# Patient Record
Sex: Female | Born: 1955
Health system: Southern US, Community
[De-identification: ages and names within clinical notes are randomized; demographics above are authoritative.]

## PROBLEM LIST (undated history)

## (undated) DIAGNOSIS — N189 Chronic kidney disease, unspecified: Secondary | ICD-10-CM

## (undated) DIAGNOSIS — M2550 Pain in unspecified joint: Secondary | ICD-10-CM

## (undated) DIAGNOSIS — F419 Anxiety disorder, unspecified: Secondary | ICD-10-CM

## (undated) DIAGNOSIS — Z85038 Personal history of other malignant neoplasm of large intestine: Principal | ICD-10-CM

## (undated) DIAGNOSIS — F32A Depression, unspecified: Secondary | ICD-10-CM

## (undated) DIAGNOSIS — F329 Major depressive disorder, single episode, unspecified: Secondary | ICD-10-CM

## (undated) DIAGNOSIS — Z9889 Other specified postprocedural states: Secondary | ICD-10-CM

## (undated) DIAGNOSIS — E785 Hyperlipidemia, unspecified: Secondary | ICD-10-CM

## (undated) DIAGNOSIS — C4491 Basal cell carcinoma of skin, unspecified: Secondary | ICD-10-CM

## (undated) DIAGNOSIS — M1990 Unspecified osteoarthritis, unspecified site: Secondary | ICD-10-CM

## (undated) DIAGNOSIS — R112 Nausea with vomiting, unspecified: Secondary | ICD-10-CM

## (undated) DIAGNOSIS — C801 Malignant (primary) neoplasm, unspecified: Secondary | ICD-10-CM

## (undated) DIAGNOSIS — G629 Polyneuropathy, unspecified: Secondary | ICD-10-CM

## (undated) HISTORY — DX: Unspecified osteoarthritis, unspecified site: M19.90

## (undated) HISTORY — DX: Chronic kidney disease, unspecified: N18.9

## (undated) HISTORY — DX: Polyneuropathy, unspecified: G62.9

## (undated) HISTORY — DX: Malignant (primary) neoplasm, unspecified: C80.1

## (undated) HISTORY — DX: Major depressive disorder, single episode, unspecified: F32.9

## (undated) HISTORY — DX: Hyperlipidemia, unspecified: E78.5

## (undated) HISTORY — DX: Basal cell carcinoma of skin, unspecified: C44.91

## (undated) HISTORY — DX: Anxiety disorder, unspecified: F41.9

## (undated) HISTORY — DX: Personal history of other malignant neoplasm of large intestine: Z85.038

---
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
## 1981-02-01 HISTORY — PX: NASAL SEPTUM SURGERY: SHX37

## 1988-02-02 HISTORY — PX: ECTOPIC PREGNANCY SURGERY: SHX613

## 1998-03-17 ENCOUNTER — Other Ambulatory Visit: Admission: RE | Admit: 1998-03-17 | Discharge: 1998-03-17 | Payer: Self-pay | Admitting: Obstetrics & Gynecology

## 1999-04-22 ENCOUNTER — Other Ambulatory Visit: Admission: RE | Admit: 1999-04-22 | Discharge: 1999-04-22 | Payer: Self-pay | Admitting: Obstetrics & Gynecology

## 2000-06-29 ENCOUNTER — Other Ambulatory Visit: Admission: RE | Admit: 2000-06-29 | Discharge: 2000-06-29 | Payer: Self-pay | Admitting: Obstetrics & Gynecology

## 2001-08-07 ENCOUNTER — Other Ambulatory Visit: Admission: RE | Admit: 2001-08-07 | Discharge: 2001-08-07 | Payer: Self-pay | Admitting: Obstetrics & Gynecology

## 2002-02-01 HISTORY — PX: ABDOMINAL HYSTERECTOMY: SHX81

## 2002-02-05 ENCOUNTER — Encounter (INDEPENDENT_AMBULATORY_CARE_PROVIDER_SITE_OTHER): Payer: Self-pay | Admitting: *Deleted

## 2002-02-06 ENCOUNTER — Inpatient Hospital Stay (HOSPITAL_COMMUNITY): Admission: RE | Admit: 2002-02-06 | Discharge: 2002-02-07 | Payer: Self-pay | Admitting: Obstetrics & Gynecology

## 2003-01-28 ENCOUNTER — Other Ambulatory Visit: Admission: RE | Admit: 2003-01-28 | Discharge: 2003-01-28 | Payer: Self-pay | Admitting: Obstetrics & Gynecology

## 2003-05-29 ENCOUNTER — Emergency Department (HOSPITAL_COMMUNITY): Admission: EM | Admit: 2003-05-29 | Discharge: 2003-05-29 | Payer: Self-pay | Admitting: Family Medicine

## 2003-08-28 ENCOUNTER — Emergency Department (HOSPITAL_COMMUNITY): Admission: EM | Admit: 2003-08-28 | Discharge: 2003-08-28 | Payer: Self-pay | Admitting: Emergency Medicine

## 2004-02-02 HISTORY — PX: HEMICOLECTOMY: SHX854

## 2004-02-02 HISTORY — PX: PORT-A-CATH REMOVAL: SHX5289

## 2004-04-08 ENCOUNTER — Encounter (INDEPENDENT_AMBULATORY_CARE_PROVIDER_SITE_OTHER): Payer: Self-pay | Admitting: *Deleted

## 2004-04-08 ENCOUNTER — Ambulatory Visit (HOSPITAL_COMMUNITY): Admission: RE | Admit: 2004-04-08 | Discharge: 2004-04-08 | Payer: Self-pay | Admitting: Gastroenterology

## 2004-04-16 ENCOUNTER — Encounter: Admission: RE | Admit: 2004-04-16 | Discharge: 2004-04-16 | Payer: Self-pay | Admitting: General Surgery

## 2004-04-20 ENCOUNTER — Encounter (INDEPENDENT_AMBULATORY_CARE_PROVIDER_SITE_OTHER): Payer: Self-pay | Admitting: Specialist

## 2004-04-20 ENCOUNTER — Inpatient Hospital Stay (HOSPITAL_COMMUNITY): Admission: RE | Admit: 2004-04-20 | Discharge: 2004-04-24 | Payer: Self-pay | Admitting: General Surgery

## 2004-04-22 ENCOUNTER — Ambulatory Visit: Payer: Self-pay | Admitting: Oncology

## 2004-04-28 ENCOUNTER — Ambulatory Visit: Payer: Self-pay | Admitting: Oncology

## 2004-06-04 ENCOUNTER — Ambulatory Visit (HOSPITAL_BASED_OUTPATIENT_CLINIC_OR_DEPARTMENT_OTHER): Admission: RE | Admit: 2004-06-04 | Discharge: 2004-06-04 | Payer: Self-pay | Admitting: General Surgery

## 2004-06-04 ENCOUNTER — Ambulatory Visit (HOSPITAL_COMMUNITY): Admission: RE | Admit: 2004-06-04 | Discharge: 2004-06-04 | Payer: Self-pay | Admitting: General Surgery

## 2004-06-15 ENCOUNTER — Ambulatory Visit: Payer: Self-pay | Admitting: Oncology

## 2004-08-10 ENCOUNTER — Ambulatory Visit: Payer: Self-pay | Admitting: Oncology

## 2004-09-04 ENCOUNTER — Other Ambulatory Visit: Admission: RE | Admit: 2004-09-04 | Discharge: 2004-09-04 | Payer: Self-pay | Admitting: Obstetrics & Gynecology

## 2004-09-28 ENCOUNTER — Ambulatory Visit: Payer: Self-pay | Admitting: Oncology

## 2004-10-12 ENCOUNTER — Ambulatory Visit: Payer: Self-pay | Admitting: Oncology

## 2004-10-23 ENCOUNTER — Inpatient Hospital Stay (HOSPITAL_COMMUNITY): Admission: AD | Admit: 2004-10-23 | Discharge: 2004-10-27 | Payer: Self-pay | Admitting: Oncology

## 2004-11-27 ENCOUNTER — Ambulatory Visit: Payer: Self-pay | Admitting: Oncology

## 2005-01-18 ENCOUNTER — Ambulatory Visit: Payer: Self-pay | Admitting: Oncology

## 2005-01-27 ENCOUNTER — Encounter: Admission: RE | Admit: 2005-01-27 | Discharge: 2005-01-27 | Payer: Self-pay | Admitting: Oncology

## 2005-02-01 HISTORY — PX: HERNIA REPAIR: SHX51

## 2005-02-10 ENCOUNTER — Ambulatory Visit (HOSPITAL_COMMUNITY): Admission: RE | Admit: 2005-02-10 | Discharge: 2005-02-11 | Payer: Self-pay | Admitting: General Surgery

## 2005-05-11 ENCOUNTER — Ambulatory Visit: Payer: Self-pay | Admitting: Oncology

## 2005-05-11 LAB — CBC WITH DIFFERENTIAL/PLATELET
BASO%: 1.3 % (ref 0.0–2.0)
Basophils Absolute: 0.1 10*3/uL (ref 0.0–0.1)
EOS%: 6.6 % (ref 0.0–7.0)
Eosinophils Absolute: 0.3 10*3/uL (ref 0.0–0.5)
HCT: 40.4 % (ref 34.8–46.6)
HGB: 13.7 g/dL (ref 11.6–15.9)
LYMPH%: 38.7 % (ref 14.0–48.0)
MCH: 30.7 pg (ref 26.0–34.0)
MCHC: 33.9 g/dL (ref 32.0–36.0)
MCV: 90.6 fL (ref 81.0–101.0)
MONO#: 0.4 10*3/uL (ref 0.1–0.9)
MONO%: 8 % (ref 0.0–13.0)
NEUT#: 2.2 10*3/uL (ref 1.5–6.5)
NEUT%: 45.4 % (ref 39.6–76.8)
Platelets: 262 10*3/uL (ref 145–400)
RBC: 4.46 10*6/uL (ref 3.70–5.32)
RDW: 13.5 % (ref 11.3–14.5)
WBC: 4.8 10*3/uL (ref 3.9–10.0)
lymph#: 1.9 10*3/uL (ref 0.9–3.3)

## 2005-05-11 LAB — COMPREHENSIVE METABOLIC PANEL
ALT: 15 U/L (ref 0–40)
AST: 21 U/L (ref 0–37)
Albumin: 4.6 g/dL (ref 3.5–5.2)
Alkaline Phosphatase: 104 U/L (ref 39–117)
BUN: 14 mg/dL (ref 6–23)
CO2: 25 mEq/L (ref 19–32)
Calcium: 8.9 mg/dL (ref 8.4–10.5)
Chloride: 105 mEq/L (ref 96–112)
Creatinine, Ser: 1 mg/dL (ref 0.4–1.2)
Glucose, Bld: 92 mg/dL (ref 70–99)
Potassium: 4.7 mEq/L (ref 3.5–5.3)
Sodium: 138 mEq/L (ref 135–145)
Total Bilirubin: 0.3 mg/dL (ref 0.3–1.2)
Total Protein: 7.1 g/dL (ref 6.0–8.3)

## 2005-05-11 LAB — LACTATE DEHYDROGENASE: LDH: 168 U/L (ref 94–250)

## 2005-05-11 LAB — CEA: CEA: 1.5 ng/mL (ref 0.0–5.0)

## 2005-05-13 ENCOUNTER — Encounter: Admission: RE | Admit: 2005-05-13 | Discharge: 2005-05-13 | Payer: Self-pay | Admitting: Oncology

## 2005-09-03 ENCOUNTER — Ambulatory Visit: Payer: Self-pay | Admitting: Oncology

## 2005-09-07 LAB — CBC WITH DIFFERENTIAL/PLATELET
BASO%: 1 % (ref 0.0–2.0)
Basophils Absolute: 0 10*3/uL (ref 0.0–0.1)
EOS%: 4.9 % (ref 0.0–7.0)
Eosinophils Absolute: 0.2 10*3/uL (ref 0.0–0.5)
HCT: 40.4 % (ref 34.8–46.6)
HGB: 13.9 g/dL (ref 11.6–15.9)
LYMPH%: 36.6 % (ref 14.0–48.0)
MCH: 31.4 pg (ref 26.0–34.0)
MCHC: 34.4 g/dL (ref 32.0–36.0)
MCV: 91.3 fL (ref 81.0–101.0)
MONO#: 0.3 10*3/uL (ref 0.1–0.9)
MONO%: 8.7 % (ref 0.0–13.0)
NEUT#: 1.9 10*3/uL (ref 1.5–6.5)
NEUT%: 48.8 % (ref 39.6–76.8)
Platelets: 237 10*3/uL (ref 145–400)
RBC: 4.42 10*6/uL (ref 3.70–5.32)
RDW: 14.4 % (ref 11.3–14.5)
WBC: 3.9 10*3/uL (ref 3.9–10.0)
lymph#: 1.4 10*3/uL (ref 0.9–3.3)

## 2005-09-07 LAB — COMPREHENSIVE METABOLIC PANEL
ALT: 9 U/L (ref 0–40)
AST: 16 U/L (ref 0–37)
Albumin: 4.3 g/dL (ref 3.5–5.2)
Alkaline Phosphatase: 86 U/L (ref 39–117)
BUN: 14 mg/dL (ref 6–23)
CO2: 29 mEq/L (ref 19–32)
Calcium: 9.4 mg/dL (ref 8.4–10.5)
Chloride: 103 mEq/L (ref 96–112)
Creatinine, Ser: 0.95 mg/dL (ref 0.40–1.20)
Glucose, Bld: 86 mg/dL (ref 70–99)
Potassium: 4.6 mEq/L (ref 3.5–5.3)
Sodium: 141 mEq/L (ref 135–145)
Total Bilirubin: 0.4 mg/dL (ref 0.3–1.2)
Total Protein: 6.8 g/dL (ref 6.0–8.3)

## 2005-09-07 LAB — CEA: CEA: 1.6 ng/mL (ref 0.0–5.0)

## 2005-09-07 LAB — LACTATE DEHYDROGENASE: LDH: 153 U/L (ref 94–250)

## 2005-09-09 ENCOUNTER — Encounter: Admission: RE | Admit: 2005-09-09 | Discharge: 2005-09-09 | Payer: Self-pay | Admitting: Oncology

## 2005-09-29 ENCOUNTER — Encounter: Admission: RE | Admit: 2005-09-29 | Discharge: 2005-09-29 | Payer: Self-pay | Admitting: Obstetrics & Gynecology

## 2006-01-12 ENCOUNTER — Ambulatory Visit: Payer: Self-pay | Admitting: Oncology

## 2006-01-14 LAB — COMPREHENSIVE METABOLIC PANEL
ALT: 9 U/L (ref 0–35)
AST: 14 U/L (ref 0–37)
Albumin: 4.3 g/dL (ref 3.5–5.2)
Alkaline Phosphatase: 76 U/L (ref 39–117)
BUN: 14 mg/dL (ref 6–23)
CO2: 26 mEq/L (ref 19–32)
Calcium: 9.1 mg/dL (ref 8.4–10.5)
Chloride: 106 mEq/L (ref 96–112)
Creatinine, Ser: 0.96 mg/dL (ref 0.40–1.20)
Glucose, Bld: 82 mg/dL (ref 70–99)
Potassium: 4.4 mEq/L (ref 3.5–5.3)
Sodium: 143 mEq/L (ref 135–145)
Total Bilirubin: 0.4 mg/dL (ref 0.3–1.2)
Total Protein: 6.8 g/dL (ref 6.0–8.3)

## 2006-01-14 LAB — CBC WITH DIFFERENTIAL/PLATELET
BASO%: 1.9 % (ref 0.0–2.0)
Basophils Absolute: 0.1 10*3/uL (ref 0.0–0.1)
EOS%: 6.1 % (ref 0.0–7.0)
Eosinophils Absolute: 0.2 10*3/uL (ref 0.0–0.5)
HCT: 37.9 % (ref 34.8–46.6)
HGB: 13.1 g/dL (ref 11.6–15.9)
LYMPH%: 38.5 % (ref 14.0–48.0)
MCH: 31.2 pg (ref 26.0–34.0)
MCHC: 34.6 g/dL (ref 32.0–36.0)
MCV: 90.1 fL (ref 81.0–101.0)
MONO#: 0.2 10*3/uL (ref 0.1–0.9)
MONO%: 6.3 % (ref 0.0–13.0)
NEUT#: 1.6 10*3/uL (ref 1.5–6.5)
NEUT%: 47.2 % (ref 39.6–76.8)
Platelets: 237 10*3/uL (ref 145–400)
RBC: 4.2 10*6/uL (ref 3.70–5.32)
RDW: 11.4 % (ref 11.3–14.5)
WBC: 3.5 10*3/uL — ABNORMAL LOW (ref 3.9–10.0)
lymph#: 1.3 10*3/uL (ref 0.9–3.3)

## 2006-01-14 LAB — CEA: CEA: 1.4 ng/mL (ref 0.0–5.0)

## 2006-01-14 LAB — LACTATE DEHYDROGENASE: LDH: 152 U/L (ref 94–250)

## 2006-01-17 ENCOUNTER — Encounter: Admission: RE | Admit: 2006-01-17 | Discharge: 2006-01-17 | Payer: Self-pay | Admitting: Oncology

## 2006-01-27 ENCOUNTER — Ambulatory Visit (HOSPITAL_COMMUNITY): Admission: RE | Admit: 2006-01-27 | Discharge: 2006-01-27 | Payer: Self-pay | Admitting: Oncology

## 2006-04-01 ENCOUNTER — Ambulatory Visit: Payer: Self-pay | Admitting: Oncology

## 2006-05-24 ENCOUNTER — Ambulatory Visit: Payer: Self-pay | Admitting: Oncology

## 2006-05-27 LAB — CBC WITH DIFFERENTIAL/PLATELET
BASO%: 1 % (ref 0.0–2.0)
Basophils Absolute: 0.1 10*3/uL (ref 0.0–0.1)
EOS%: 4.1 % (ref 0.0–7.0)
Eosinophils Absolute: 0.2 10*3/uL (ref 0.0–0.5)
HCT: 38.3 % (ref 34.8–46.6)
HGB: 13.5 g/dL (ref 11.6–15.9)
LYMPH%: 32.1 % (ref 14.0–48.0)
MCH: 32.1 pg (ref 26.0–34.0)
MCHC: 35.3 g/dL (ref 32.0–36.0)
MCV: 90.9 fL (ref 81.0–101.0)
MONO#: 0.4 10*3/uL (ref 0.1–0.9)
MONO%: 6.9 % (ref 0.0–13.0)
NEUT#: 3.3 10*3/uL (ref 1.5–6.5)
NEUT%: 55.9 % (ref 39.6–76.8)
Platelets: 268 10*3/uL (ref 145–400)
RBC: 4.21 10*6/uL (ref 3.70–5.32)
RDW: 13.9 % (ref 11.3–14.5)
WBC: 5.9 10*3/uL (ref 3.9–10.0)
lymph#: 1.9 10*3/uL (ref 0.9–3.3)

## 2006-05-27 LAB — COMPREHENSIVE METABOLIC PANEL
ALT: 11 U/L (ref 0–35)
AST: 15 U/L (ref 0–37)
Albumin: 4.3 g/dL (ref 3.5–5.2)
Alkaline Phosphatase: 73 U/L (ref 39–117)
BUN: 20 mg/dL (ref 6–23)
CO2: 27 mEq/L (ref 19–32)
Calcium: 8.9 mg/dL (ref 8.4–10.5)
Chloride: 106 mEq/L (ref 96–112)
Creatinine, Ser: 1.08 mg/dL (ref 0.40–1.20)
Glucose, Bld: 89 mg/dL (ref 70–99)
Potassium: 4.2 mEq/L (ref 3.5–5.3)
Sodium: 141 mEq/L (ref 135–145)
Total Bilirubin: 0.3 mg/dL (ref 0.3–1.2)
Total Protein: 7.2 g/dL (ref 6.0–8.3)

## 2006-05-27 LAB — LACTATE DEHYDROGENASE: LDH: 160 U/L (ref 94–250)

## 2006-05-27 LAB — CEA: CEA: 1.1 ng/mL (ref 0.0–5.0)

## 2006-05-30 ENCOUNTER — Ambulatory Visit (HOSPITAL_COMMUNITY): Admission: RE | Admit: 2006-05-30 | Discharge: 2006-05-30 | Payer: Self-pay | Admitting: Oncology

## 2006-09-28 ENCOUNTER — Ambulatory Visit: Payer: Self-pay | Admitting: Oncology

## 2006-09-30 LAB — CBC WITH DIFFERENTIAL/PLATELET
BASO%: 0.5 % (ref 0.0–2.0)
Basophils Absolute: 0 10*3/uL (ref 0.0–0.1)
EOS%: 3.9 % (ref 0.0–7.0)
Eosinophils Absolute: 0.2 10*3/uL (ref 0.0–0.5)
HCT: 38.2 % (ref 34.8–46.6)
HGB: 13.4 g/dL (ref 11.6–15.9)
LYMPH%: 35.7 % (ref 14.0–48.0)
MCH: 31.7 pg (ref 26.0–34.0)
MCHC: 35 g/dL (ref 32.0–36.0)
MCV: 90.4 fL (ref 81.0–101.0)
MONO#: 0.4 10*3/uL (ref 0.1–0.9)
MONO%: 8.8 % (ref 0.0–13.0)
NEUT#: 2.2 10*3/uL (ref 1.5–6.5)
NEUT%: 51.1 % (ref 39.6–76.8)
Platelets: 224 10*3/uL (ref 145–400)
RBC: 4.22 10*6/uL (ref 3.70–5.32)
RDW: 13.5 % (ref 11.3–14.5)
WBC: 4.3 10*3/uL (ref 3.9–10.0)
lymph#: 1.5 10*3/uL (ref 0.9–3.3)

## 2006-09-30 LAB — COMPREHENSIVE METABOLIC PANEL
ALT: 12 U/L (ref 0–35)
AST: 17 U/L (ref 0–37)
Albumin: 4.4 g/dL (ref 3.5–5.2)
Alkaline Phosphatase: 63 U/L (ref 39–117)
BUN: 17 mg/dL (ref 6–23)
CO2: 24 mEq/L (ref 19–32)
Calcium: 9.4 mg/dL (ref 8.4–10.5)
Chloride: 104 mEq/L (ref 96–112)
Creatinine, Ser: 1.07 mg/dL (ref 0.40–1.20)
Glucose, Bld: 88 mg/dL (ref 70–99)
Potassium: 4.1 mEq/L (ref 3.5–5.3)
Sodium: 139 mEq/L (ref 135–145)
Total Bilirubin: 0.4 mg/dL (ref 0.3–1.2)
Total Protein: 6.8 g/dL (ref 6.0–8.3)

## 2006-09-30 LAB — LACTATE DEHYDROGENASE: LDH: 149 U/L (ref 94–250)

## 2006-09-30 LAB — CEA: CEA: 1.4 ng/mL (ref 0.0–5.0)

## 2006-10-04 ENCOUNTER — Encounter: Admission: RE | Admit: 2006-10-04 | Discharge: 2006-10-04 | Payer: Self-pay | Admitting: Oncology

## 2006-10-25 ENCOUNTER — Encounter: Admission: RE | Admit: 2006-10-25 | Discharge: 2006-10-25 | Payer: Self-pay | Admitting: Obstetrics & Gynecology

## 2007-03-30 ENCOUNTER — Ambulatory Visit: Payer: Self-pay | Admitting: Oncology

## 2007-04-03 LAB — CBC WITH DIFFERENTIAL/PLATELET
BASO%: 0.3 % (ref 0.0–2.0)
Basophils Absolute: 0 10*3/uL (ref 0.0–0.1)
EOS%: 3.5 % (ref 0.0–7.0)
Eosinophils Absolute: 0.2 10*3/uL (ref 0.0–0.5)
HCT: 33.9 % — ABNORMAL LOW (ref 34.8–46.6)
HGB: 11.9 g/dL (ref 11.6–15.9)
LYMPH%: 35.8 % (ref 14.0–48.0)
MCH: 31.9 pg (ref 26.0–34.0)
MCHC: 35 g/dL (ref 32.0–36.0)
MCV: 91.4 fL (ref 81.0–101.0)
MONO#: 0.3 10*3/uL (ref 0.1–0.9)
MONO%: 6.4 % (ref 0.0–13.0)
NEUT#: 2.8 10*3/uL (ref 1.5–6.5)
NEUT%: 54 % (ref 39.6–76.8)
Platelets: 220 10*3/uL (ref 145–400)
RBC: 3.71 10*6/uL (ref 3.70–5.32)
RDW: 13.8 % (ref 11.3–14.5)
WBC: 5.2 10*3/uL (ref 3.9–10.0)
lymph#: 1.9 10*3/uL (ref 0.9–3.3)

## 2007-04-03 LAB — COMPREHENSIVE METABOLIC PANEL
ALT: 17 U/L (ref 0–35)
AST: 24 U/L (ref 0–37)
Albumin: 3.9 g/dL (ref 3.5–5.2)
Alkaline Phosphatase: 51 U/L (ref 39–117)
BUN: 13 mg/dL (ref 6–23)
CO2: 27 mEq/L (ref 19–32)
Calcium: 8.8 mg/dL (ref 8.4–10.5)
Chloride: 104 mEq/L (ref 96–112)
Creatinine, Ser: 0.96 mg/dL (ref 0.40–1.20)
Glucose, Bld: 95 mg/dL (ref 70–99)
Potassium: 3.8 mEq/L (ref 3.5–5.3)
Sodium: 138 mEq/L (ref 135–145)
Total Bilirubin: 0.6 mg/dL (ref 0.3–1.2)
Total Protein: 6.4 g/dL (ref 6.0–8.3)

## 2007-04-03 LAB — CEA: CEA: 1.6 ng/mL (ref 0.0–5.0)

## 2007-04-03 LAB — LACTATE DEHYDROGENASE: LDH: 153 U/L (ref 94–250)

## 2007-04-04 ENCOUNTER — Encounter: Admission: RE | Admit: 2007-04-04 | Discharge: 2007-04-04 | Payer: Self-pay | Admitting: Oncology

## 2007-04-13 ENCOUNTER — Ambulatory Visit (HOSPITAL_COMMUNITY): Admission: RE | Admit: 2007-04-13 | Discharge: 2007-04-13 | Payer: Self-pay | Admitting: Oncology

## 2007-08-23 ENCOUNTER — Ambulatory Visit: Payer: Self-pay | Admitting: Oncology

## 2007-08-25 LAB — CBC WITH DIFFERENTIAL/PLATELET
BASO%: 0.8 % (ref 0.0–2.0)
Basophils Absolute: 0 10*3/uL (ref 0.0–0.1)
EOS%: 4.3 % (ref 0.0–7.0)
Eosinophils Absolute: 0.2 10*3/uL (ref 0.0–0.5)
HCT: 38.8 % (ref 34.8–46.6)
HGB: 13.4 g/dL (ref 11.6–15.9)
LYMPH%: 48.9 % — ABNORMAL HIGH (ref 14.0–48.0)
MCH: 31.6 pg (ref 26.0–34.0)
MCHC: 34.7 g/dL (ref 32.0–36.0)
MCV: 91.3 fL (ref 81.0–101.0)
MONO#: 0.3 10*3/uL (ref 0.1–0.9)
MONO%: 8.5 % (ref 0.0–13.0)
NEUT#: 1.4 10*3/uL — ABNORMAL LOW (ref 1.5–6.5)
NEUT%: 37.5 % — ABNORMAL LOW (ref 39.6–76.8)
Platelets: 226 10*3/uL (ref 145–400)
RBC: 4.25 10*6/uL (ref 3.70–5.32)
RDW: 13.6 % (ref 11.3–14.5)
WBC: 3.8 10*3/uL — ABNORMAL LOW (ref 3.9–10.0)
lymph#: 1.9 10*3/uL (ref 0.9–3.3)

## 2007-08-25 LAB — COMPREHENSIVE METABOLIC PANEL
ALT: 19 U/L (ref 0–35)
AST: 21 U/L (ref 0–37)
Albumin: 4.4 g/dL (ref 3.5–5.2)
Alkaline Phosphatase: 58 U/L (ref 39–117)
BUN: 22 mg/dL (ref 6–23)
CO2: 28 mEq/L (ref 19–32)
Calcium: 9.4 mg/dL (ref 8.4–10.5)
Chloride: 104 mEq/L (ref 96–112)
Creatinine, Ser: 1.12 mg/dL (ref 0.40–1.20)
Glucose, Bld: 93 mg/dL (ref 70–99)
Potassium: 4 mEq/L (ref 3.5–5.3)
Sodium: 142 mEq/L (ref 135–145)
Total Bilirubin: 0.5 mg/dL (ref 0.3–1.2)
Total Protein: 7.1 g/dL (ref 6.0–8.3)

## 2007-08-25 LAB — LACTATE DEHYDROGENASE: LDH: 154 U/L (ref 94–250)

## 2007-08-25 LAB — CEA: CEA: 1.5 ng/mL (ref 0.0–5.0)

## 2007-08-28 ENCOUNTER — Encounter: Admission: RE | Admit: 2007-08-28 | Discharge: 2007-08-28 | Payer: Self-pay | Admitting: Oncology

## 2007-11-22 ENCOUNTER — Encounter: Admission: RE | Admit: 2007-11-22 | Discharge: 2007-11-22 | Payer: Self-pay | Admitting: Obstetrics & Gynecology

## 2008-02-02 HISTORY — PX: REDUCTION MAMMAPLASTY: SUR839

## 2008-02-02 HISTORY — PX: KNEE ARTHROSCOPY: SUR90

## 2008-02-15 ENCOUNTER — Ambulatory Visit: Payer: Self-pay | Admitting: Oncology

## 2008-02-20 ENCOUNTER — Ambulatory Visit (HOSPITAL_COMMUNITY): Admission: RE | Admit: 2008-02-20 | Discharge: 2008-02-20 | Payer: Self-pay | Admitting: Oncology

## 2008-03-07 ENCOUNTER — Ambulatory Visit (HOSPITAL_COMMUNITY): Admission: RE | Admit: 2008-03-07 | Discharge: 2008-03-07 | Payer: Self-pay | Admitting: Oncology

## 2008-07-18 ENCOUNTER — Ambulatory Visit: Payer: Self-pay | Admitting: Oncology

## 2008-07-22 LAB — CBC WITH DIFFERENTIAL/PLATELET
BASO%: 0.4 % (ref 0.0–2.0)
Basophils Absolute: 0 10*3/uL (ref 0.0–0.1)
EOS%: 2.8 % (ref 0.0–7.0)
Eosinophils Absolute: 0.1 10*3/uL (ref 0.0–0.5)
HCT: 36.4 % (ref 34.8–46.6)
HGB: 12.9 g/dL (ref 11.6–15.9)
LYMPH%: 36.8 % (ref 14.0–49.7)
MCH: 32.5 pg (ref 25.1–34.0)
MCHC: 35.3 g/dL (ref 31.5–36.0)
MCV: 92 fL (ref 79.5–101.0)
MONO#: 0.4 10*3/uL (ref 0.1–0.9)
MONO%: 7.4 % (ref 0.0–14.0)
NEUT#: 2.6 10*3/uL (ref 1.5–6.5)
NEUT%: 52.6 % (ref 38.4–76.8)
Platelets: 257 10*3/uL (ref 145–400)
RBC: 3.96 10*6/uL (ref 3.70–5.45)
RDW: 14.4 % (ref 11.2–14.5)
WBC: 5 10*3/uL (ref 3.9–10.3)
lymph#: 1.8 10*3/uL (ref 0.9–3.3)

## 2008-07-22 LAB — COMPREHENSIVE METABOLIC PANEL
ALT: 12 U/L (ref 0–35)
AST: 14 U/L (ref 0–37)
Albumin: 3.9 g/dL (ref 3.5–5.2)
Alkaline Phosphatase: 63 U/L (ref 39–117)
BUN: 18 mg/dL (ref 6–23)
CO2: 28 mEq/L (ref 19–32)
Calcium: 9 mg/dL (ref 8.4–10.5)
Chloride: 104 mEq/L (ref 96–112)
Creatinine, Ser: 1.03 mg/dL (ref 0.40–1.20)
Glucose, Bld: 118 mg/dL — ABNORMAL HIGH (ref 70–99)
Potassium: 4.2 mEq/L (ref 3.5–5.3)
Sodium: 140 mEq/L (ref 135–145)
Total Bilirubin: 0.4 mg/dL (ref 0.3–1.2)
Total Protein: 6.6 g/dL (ref 6.0–8.3)

## 2008-07-22 LAB — LACTATE DEHYDROGENASE: LDH: 170 U/L (ref 94–250)

## 2008-07-22 LAB — CEA: CEA: 1.5 ng/mL (ref 0.0–5.0)

## 2008-07-24 ENCOUNTER — Encounter: Admission: RE | Admit: 2008-07-24 | Discharge: 2008-07-24 | Payer: Self-pay | Admitting: Oncology

## 2009-01-06 IMAGING — PT NM PET TUM IMG SKULL BASE T - THIGH
1 of 6 series · 1 of 25 positions shown · non-contrast
Comparison: 01/27/06.

CLINICAL DATA: Follow-up metastatic colon carcinoma.  Status post chemotherapy.
 FDG PET-CT TUMOR IMAGING (SKULL BASE TO THIGHS):

 Fasting Blood Glucose:  109.
TECHNIQUE: 18.3 mCi F-18 FDG were administered via the right arm.  Full ring PET imaging was performed from the skull base through the mid-thighs 56 minutes after injection.  CT data was obtained and used for attenuation correction and anatomic localization only.  (This was not acquired as a diagnostic CT examination.)

[Series 1: pet ac · axial · 3.3mm · 4.69mm/px · 1 of 267 slices shown]
[im 134/267]
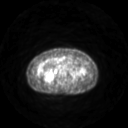

[1 of 25 positions shown; findings below may reference images not displayed]

FINDINGS: No abnormal sites of hypermetabolic activity are identified within the neck, chest, abdomen, or pelvis which are suspicious for malignancy.  No significant change is seen compared with prior study.
IMPRESSION: Stable PET CT scan.  No evidence of hypermetabolic tumor.

## 2009-01-23 ENCOUNTER — Ambulatory Visit: Payer: Self-pay | Admitting: Oncology

## 2009-01-28 LAB — CBC WITH DIFFERENTIAL/PLATELET
BASO%: 0.4 % (ref 0.0–2.0)
Basophils Absolute: 0 10*3/uL (ref 0.0–0.1)
EOS%: 2.5 % (ref 0.0–7.0)
Eosinophils Absolute: 0.2 10*3/uL (ref 0.0–0.5)
HCT: 38.1 % (ref 34.8–46.6)
HGB: 12.9 g/dL (ref 11.6–15.9)
LYMPH%: 29.5 % (ref 14.0–49.7)
MCH: 31.9 pg (ref 25.1–34.0)
MCHC: 33.9 g/dL (ref 31.5–36.0)
MCV: 93.9 fL (ref 79.5–101.0)
MONO#: 0.5 10*3/uL (ref 0.1–0.9)
MONO%: 8 % (ref 0.0–14.0)
NEUT#: 4 10*3/uL (ref 1.5–6.5)
NEUT%: 59.6 % (ref 38.4–76.8)
Platelets: 251 10*3/uL (ref 145–400)
RBC: 4.06 10*6/uL (ref 3.70–5.45)
RDW: 13.9 % (ref 11.2–14.5)
WBC: 6.7 10*3/uL (ref 3.9–10.3)
lymph#: 2 10*3/uL (ref 0.9–3.3)

## 2009-01-28 LAB — CEA: CEA: 1.4 ng/mL (ref 0.0–5.0)

## 2009-01-28 LAB — COMPREHENSIVE METABOLIC PANEL
ALT: 11 U/L (ref 0–35)
AST: 17 U/L (ref 0–37)
Albumin: 4.1 g/dL (ref 3.5–5.2)
Alkaline Phosphatase: 63 U/L (ref 39–117)
BUN: 17 mg/dL (ref 6–23)
CO2: 24 mEq/L (ref 19–32)
Calcium: 8.5 mg/dL (ref 8.4–10.5)
Chloride: 105 mEq/L (ref 96–112)
Creatinine, Ser: 1.15 mg/dL (ref 0.40–1.20)
Glucose, Bld: 98 mg/dL (ref 70–99)
Potassium: 4.1 mEq/L (ref 3.5–5.3)
Sodium: 141 mEq/L (ref 135–145)
Total Bilirubin: 0.2 mg/dL — ABNORMAL LOW (ref 0.3–1.2)
Total Protein: 6.5 g/dL (ref 6.0–8.3)

## 2009-01-28 LAB — LACTATE DEHYDROGENASE: LDH: 178 U/L (ref 94–250)

## 2009-02-01 HISTORY — PX: BREAST SURGERY: SHX581

## 2009-02-04 ENCOUNTER — Ambulatory Visit: Payer: Self-pay | Admitting: Oncology

## 2009-04-23 ENCOUNTER — Encounter: Admission: RE | Admit: 2009-04-23 | Discharge: 2009-04-23 | Payer: Self-pay | Admitting: Obstetrics & Gynecology

## 2009-08-15 ENCOUNTER — Ambulatory Visit: Payer: Self-pay | Admitting: Oncology

## 2009-08-19 LAB — COMPREHENSIVE METABOLIC PANEL
ALT: 9 U/L (ref 0–35)
AST: 15 U/L (ref 0–37)
Albumin: 4.3 g/dL (ref 3.5–5.2)
Alkaline Phosphatase: 66 U/L (ref 39–117)
BUN: 16 mg/dL (ref 6–23)
CO2: 24 mEq/L (ref 19–32)
Calcium: 9 mg/dL (ref 8.4–10.5)
Chloride: 105 mEq/L (ref 96–112)
Creatinine, Ser: 1.12 mg/dL (ref 0.40–1.20)
Glucose, Bld: 93 mg/dL (ref 70–99)
Potassium: 4.6 mEq/L (ref 3.5–5.3)
Sodium: 141 mEq/L (ref 135–145)
Total Bilirubin: 0.4 mg/dL (ref 0.3–1.2)
Total Protein: 6.9 g/dL (ref 6.0–8.3)

## 2009-08-19 LAB — CBC WITH DIFFERENTIAL/PLATELET
BASO%: 1.1 % (ref 0.0–2.0)
Basophils Absolute: 0 10*3/uL (ref 0.0–0.1)
EOS%: 4.9 % (ref 0.0–7.0)
Eosinophils Absolute: 0.2 10*3/uL (ref 0.0–0.5)
HCT: 36 % (ref 34.8–46.6)
HGB: 12.3 g/dL (ref 11.6–15.9)
LYMPH%: 44.8 % (ref 14.0–49.7)
MCH: 31.9 pg (ref 25.1–34.0)
MCHC: 34.2 g/dL (ref 31.5–36.0)
MCV: 93.2 fL (ref 79.5–101.0)
MONO#: 0.4 10*3/uL (ref 0.1–0.9)
MONO%: 9.2 % (ref 0.0–14.0)
NEUT#: 1.6 10*3/uL (ref 1.5–6.5)
NEUT%: 40 % (ref 38.4–76.8)
Platelets: 223 10*3/uL (ref 145–400)
RBC: 3.86 10*6/uL (ref 3.70–5.45)
RDW: 14.3 % (ref 11.2–14.5)
WBC: 4.1 10*3/uL (ref 3.9–10.3)
lymph#: 1.8 10*3/uL (ref 0.9–3.3)

## 2009-08-19 LAB — LACTATE DEHYDROGENASE: LDH: 154 U/L (ref 94–250)

## 2009-08-19 LAB — CEA: CEA: 1.7 ng/mL (ref 0.0–5.0)

## 2009-08-21 ENCOUNTER — Encounter: Admission: RE | Admit: 2009-08-21 | Discharge: 2009-08-21 | Payer: Self-pay | Admitting: Oncology

## 2010-02-22 ENCOUNTER — Encounter: Payer: Self-pay | Admitting: Oncology

## 2010-04-20 ENCOUNTER — Other Ambulatory Visit: Payer: Self-pay | Admitting: Obstetrics & Gynecology

## 2010-04-20 DIAGNOSIS — Z1231 Encounter for screening mammogram for malignant neoplasm of breast: Secondary | ICD-10-CM

## 2010-04-20 DIAGNOSIS — Z9889 Other specified postprocedural states: Secondary | ICD-10-CM

## 2010-04-29 ENCOUNTER — Ambulatory Visit
Admission: RE | Admit: 2010-04-29 | Discharge: 2010-04-29 | Disposition: A | Discharge status: Home / Self Care | Payer: BC Managed Care – PPO | Source: Ambulatory Visit | Attending: Obstetrics & Gynecology | Admitting: Obstetrics & Gynecology

## 2010-04-29 DIAGNOSIS — Z9889 Other specified postprocedural states: Secondary | ICD-10-CM

## 2010-04-29 DIAGNOSIS — Z1231 Encounter for screening mammogram for malignant neoplasm of breast: Secondary | ICD-10-CM

## 2010-05-19 LAB — BODY FLUID CELL COUNT WITH DIFFERENTIAL: Total Nucleated Cell Count, Fluid: 7 cu mm (ref 0–1000)

## 2010-05-19 LAB — CBC
HCT: 36.8 % (ref 36.0–46.0)
Hemoglobin: 12.7 g/dL (ref 12.0–15.0)
MCHC: 34.4 g/dL (ref 30.0–36.0)
MCV: 92 fL (ref 78.0–100.0)
Platelets: 218 10*3/uL (ref 150–400)
RBC: 4 MIL/uL (ref 3.87–5.11)
RDW: 13.9 % (ref 11.5–15.5)
WBC: 5.9 10*3/uL (ref 4.0–10.5)

## 2010-05-19 LAB — PROTIME-INR
INR: 1 (ref 0.00–1.49)
Prothrombin Time: 13.3 seconds (ref 11.6–15.2)

## 2010-05-19 LAB — PATHOLOGIST SMEAR REVIEW

## 2010-05-19 LAB — BODY FLUID CULTURE
Culture: NO GROWTH
Gram Stain: NONE SEEN

## 2010-06-19 NOTE — Discharge Summary (Signed)
NAMECAMIYA, Lorraine May                 ACCOUNT NO.:  [id number]   MEDICAL RECORD NO.:  [id number]          PATIENT TYPE:  OIB   LOCATION:  1012                         FACILITY:  [facility]   PHYSICIAN:  Gita Kudo, M.D. DATE OF BIRTH:  1955-07-10   DATE OF ADMISSION:  02/10/2005  DATE OF DISCHARGE:  02/11/2005                                 DISCHARGE SUMMARY   OPERATIVE PROCEDURE:  Repair abdominal incisional hernia, removal of  indwelling venous access device (X-Port).   SURGEON:  Dr. Maryagnes Amos   ASSISTANT:  Dr. Corliss Skains   ANESTHESIA:  General.   PREOPERATIVE DIAGNOSIS:  Incisional hernia and indwelling Port-A-Cath.   POSTOPERATIVE DIAGNOSIS:  Incisional hernia and indwelling Port-A-Cath.   CLINICAL SUMMARY:  A 55 year-old woman with abdominal incisional hernia.  In  March 2006, I resected a right colon cancer.  Approximately 8 months later,  at an office visit, she had an incisional hernia.  She has had chemotherapy  through a port placed in May and is due to have that removed also.   OPERATIVE FINDINGS:  The patient had a large size incisional hernia with a  fascial defect that measured approximately 5.5 cm in diameter.  The catheter  and reservoir were removed intact without any problem.   OPERATIVE PROCEDURE:  Under satisfactory general endotracheal anesthesia,  having received IV antibiotic and subcu heparin preop, the patient is  positioned, prepped and draped in the standard fashion.  The previous  midline incision was reopened and carefully taken down to the subcu where  the hernia was identified.  Good exposure and retraction allowed me to  sharply dissect and also use cautery to delineate the sac circumferentially  down to the fascia.  The sac was then opened and excess trimmed down to the  fascial margin.  Then the hernia edges were elevated and with sharp and  blunt dissection, I took adhesions all the way around internally, and this  allowed for good placement of the  mesh that we used.  Hemostasis was good.  Then a piece of [employer] Com[employer]ix mesh was used.  It was 11 x 14 cm oval.  It  underlay the defect and extended all around in at least 3-4 cm.  Following  this, quadrant sutures of 0 Prolene were placed through the abdominal wall  fascia peritoneum through the upper layer of the mesh and then back up and  tied on the top of the fascia.  In so doing, the mesh was held in good  position.  Additional sutures placed in between, and a total of 8 sutures  held the mesh in excellent position against the abdominal wall.  Then excess  sac was trimmed away and a little relaxing incision made just outside the  hernia defect margin.  The defect was then closed in a transverse direction  with interrupted 0 Prolene figure-of-eight and running number #1 PDS suture,  taking intermittent bites of the mesh to again hold it in good position.  The subcu was lavaged with saline, infiltrated with 30 mL of 0.25% Marcaine  with epinephrine for postop  analgesia.  Closed with deep 2-0 Vicryl suture  after a #19 JP drain was placed.  Then the skin edges were stapled.  A  sterile absorbent dressing was applied.   The port was then removed.  The area was prepped and draped in a standard  fashion.  The previous incision reopened down to the reservoir, and it was  removed from its capsule.  Sutures holding it in place cut in the reservoir  with the attached catheter removed intact and without problem.  The patient  was placed in the head up position during this time.  This wound was then  closed with interrupted 3-0 Vicryl and Steri-Strips for skin.  Sterile  dressings were applied, and the patient went to the recovery room from the  operating room in good condition without complication.           ______________________________  Gita Kudo, M.D.     MRL/MEDQ  D:  02/10/2005  T:  02/11/2005  Job:  [id number]   cc:   Duncan Dull, M.D.  Fax: [phone]   Graylin Shiver,  M.D.  Fax: [phone]   Genene Churn. Cyndie Chime, M.D.  Fax: [phone]

## 2010-06-19 NOTE — H&P (Signed)
NAME:  Lorraine May, Lorraine May                           ACCOUNT NO.:  [id number]   MEDICAL RECORD NO.:  [id number]                   PATIENT TYPE:  AMB   LOCATION:  SDC                                  FACILITY:  [facility]   PHYSICIAN:  Freddy Finner, M.D.                DATE OF BIRTH:  07-21-1955   DATE OF ADMISSION:  02/06/2002  DATE OF DISCHARGE:                                HISTORY & PHYSICAL   ADMISSION DIAGNOSES:  Uterine leiomyomata with one pedunculated and one  intramural probably degenerating, possible torsion of pedunculated myoma.  Clinical symptoms of postcoital bleeding and dysfunctional uterine bleeding.   HISTORY OF PRESENT ILLNESS:  The patient is a 55 year old white married  female, gravida 2, para 1, who had a tubal pregnancy and had tubal  sterilization along with partial salpingectomy in 1990 for an ectopic  pregnancy and a request for surgical sterilization.  Since that time she has  done well.  She has had perimenopausal symptoms for approximately 2-1/2  years and has been successfully managed with Vivelle 0.1 mg transdermal  estrogen replacement.  This has produced fairly regular periods over the  last 2-1/2 years, but she has started having significant postcoital  bleeding.   A recent pelvic ultrasound in the office revealed fibroids noted above.  There were no adnexal masses.  The patient has a known allergy to  progesterone supplements, documented many years ago presumably first during  pregnancy.  This precludes the use of progesterone as part of her hormone  replacement therapy because of her bleeding or good response to hormone  replacement, she has requested definitive surgical intervention and is  admitted at this time for laparoscopically-assisted vaginal hysterectomy and  bilateral salpingo-oophorectomy.   REVIEW OF SYSTEMS:  Currently this is otherwise negative.  There are no  cardiac, pulmonary, GI or GU complaints.   PAST MEDICAL HISTORY:  The patient  has no known significant medical  illnesses.   ALLERGIES:  TETRACYCLINE, SULFA DRUGS.   CURRENT MEDICATIONS:  Vivelle 0.1 mg skin patch.  No other medications at  the present time.   OTHER HISTORY:  She has never had a blood transfusion.   SOCIAL HISTORY:  She does not use cigarettes.   PREVIOUS SURGICAL PROCEDURES:  D&C for a missed AB previously, and the tubal  surgery noted above.   PHYSICAL EXAMINATION:  HEENT:  Grossly within normal limits.  NECK:  Thyroid gland is not palpably enlarged.  VITAL SIGNS:  Blood pressure on the most recent check in the office was  98/70.  CHEST:  Clear to auscultation.  HEART:  Normal sinus rhythm without murmurs, rubs or gallops.  ABDOMEN:  Soft and nontender, without appreciable organomegaly or palpable  masses.  PELVIC:  External genitalia, vagina and cervix are normal.  Bimanual reveals  the uterus to be approximately eight weeks size.  Please note ultrasound  findings as noted  above.  There are no palpable adnexal masses.  The rectum  is palpably normal.  The rectovaginal exam confirms.  EXTREMITIES:  Without clubbing, cyanosis or edema.   ASSESSMENT:  Pedunculated uterine leiomyomata; intramural uterine  leiomyomata; probable degeneration of intramural myoma; dysfunctional and  postcoital bleeding.   PLAN:  Laparoscopically-assisted vaginal hysterectomy and bilateral salpingo-  oophorectomy.  The patient has reviewed the video in the office describing  the operative procedure, including the potential risks of the procedure.  She is prepared to proceed.                                               Freddy Finner, M.D.    WRN/MEDQ  D:  02/03/2002  T:  02/03/2002  Job:  [id number]

## 2010-06-19 NOTE — Op Note (Signed)
NAMEMARISE, Lorraine May                 ACCOUNT NO.:  [id number]   MEDICAL RECORD NO.:  [id number]          PATIENT TYPE:  INP   LOCATION:  2855                         FACILITY:  [facility]   PHYSICIAN:  Gita Kudo, M.D. DATE OF BIRTH:  [birth year]   DATE OF PROCEDURE:  DATE OF DISCHARGE:                                 OPERATIVE REPORT   OPERATIVE PROCEDURE:  Right hemicolectomy.   SURGEON:  Gita Kudo, M.D.   ASSISTANT:  Vikki Ports, M.D.   ANESTHESIA:  General endotracheal.   PREOPERATIVE DIAGNOSIS:  Carcinoma of the cecum.   POSTOPERATIVE DIAGNOSIS:  Carcinoma of the cecum, no metastases noted.   CLINICAL SUMMARY:  A 55 year old female preparing for the surgery had anemia  diagnosed and then underwent colonoscopy with a biopsy-proven cancer of the  cecum. Preoperative CEA was slightly high at 5.7. Pre CAT scan negative for  metastatic disease.   OPERATIVE FINDINGS:  The patient had a tumor of her cecum that was mobile.  It was not attached. There were no enlarged nodes in the mesentery. The  liver looked and felt normal. There was no fluid in the abdomen. The  remainder of the bowel felt and looked normal. The major vessels felt  normal. The right ureter was identified, not injured.   OPERATIVE PROCEDURE:  Under satisfactory general endotracheal anesthesia,  the patient was prepped, draped, and positioned in the standard fashion. PAS  hose, Foley catheters, and NG placed. She received preoperative heparin and  antibiotics. She had a good bowel prep.  A midline incision was made into  the peritoneum and hemostasis controlled by cautery. Self-retaining  retractors placed after the manual and visual laparotomy performed.  Then,  the lateral reflections of the right colon were taken down using cautery up  to the hepatic flexure which was divided between multiple clamps and ties of  2-0 silk. In this fashion, we freed the entire right colon over to the  proximal  transverse colon and distal ileum. Then, the transverse colon and  ileum were each transected with a GIA stapler. The mesentery then taken  between clamps and ties of 2-0 silk and the specimen removed.  A stab wound  made in each of the bowel segments and anastomosis performed with a GIA  stapler. The staple line was hemostatic and, then, the stab wound closed  with interrupted 3-0 silk. The mesentery was then closed with figure-of-  eight 2-0 an 3-0 silks. A running 2-0 silk was then placed over the stab  wound closure and, also, the stapled ends of the bowel.  Gloves were changed. The abdomen was lavaged with saline and sponge and  needle counts correct.  Abdomen was closed with running double-stranded #1  PDS followed by staples for skin. Sterile dressings were then applied and  the patient went to the recovery room in good condition.      MRL/MEDQ  D:  04/20/2004  T:  04/20/2004  Job:  [id number]   cc:   Duncan Dull, M.D.  [address]  [place]  [place] [address]  Fax:  [phone]   Graylin Shiver, M.D.  [address]  [address]  [place], [place] [address]  Fax: [phone]   Almedia Balls. Ranell Patrick, M.D.  [facility]  [address]  [address]  [place]  [place] [address]  Fax: [phone]

## 2010-06-19 NOTE — Op Note (Signed)
[name], [name]                 ACCOUNT NO.:  [id number]   MEDICAL RECORD NO.:  [id number]          PATIENT TYPE:  AMB   LOCATION:  ENDO                         [facility]ACILITY:  [facility]   PHYSICIAN:  Graylin Shiver, M.D.   DATE OF BIRTH:  [birth year]   DATE OF PROCEDURE:  04/08/2004  DATE OF DISCHARGE:                                 OPERATIVE REPORT   PROCEDURE:  Esophagogastroduodenoscopy with biopsy.   INDICATION:  Newly discovered iron-deficiency anemia.   Informed consent was obtained after explanation of the risks of bleeding,  infection and perforation.   PREMEDICATION:  Fentanyl 75 mcg IV, Versed 6 mg IV.   PROCEDURE:  With the patient in the in the left lateral decubitus position.  The Olympus gastroscope was inserted into the oropharynx and passed into the  esophagus. It was advanced down the esophagus and then into the stomach and  into the duodenum. The second portion and bulb of the duodenum looked  normal. The stomach showed some mild erythema in the antrum compatible with  gastritis. Biopsy was obtained. The body of the stomach looked normal.  In  the fundus of the stomach seen on retroflexion, there were a few tiny fleshy  colored polyps, representative biopsies were obtained. They appeared to be  benign. The esophagus looked normal in its entirety. She tolerated the  procedure well without complications.   IMPRESSION:  1.  Antral gastritis.  2.  Benign-appearing fundic polyps in the stomach.   PLAN:  The biopsies will be checked.      Lorraine May  D:  04/08/2004  T:  04/08/2004  Job:  [id number]   cc:   Lorraine May, M.D.  [address]  [place]  [place] [address]  Fax: [phone]

## 2010-06-19 NOTE — Discharge Summary (Signed)
[name], [name] NO.:  [id number]   MEDICAL RECORD NO.:  [id number]            PATIENT TYPE:   LOCATION:                                 FACILITY:   PHYSICIAN:  Gita Kudo, M.D.      DATE OF BIRTH:   DATE OF ADMISSION:  DATE OF DISCHARGE:                                 DISCHARGE SUMMARY   CHIEF COMPLAINT:  Cancer right colon.   HISTORY OF PRESENT ILLNESS:  Lorraine May is a 55 year old woman brought in for  elective right colectomy for routine studies prior to knee surgery.  Anemia  was diagnosed.  Work-up with colonoscopy by Dr. Evette Cristal showed a cancer and  she is brought in for elective right colon resection.   LABORATORY DATA:  Pathology report:  Carcinoma right colon.  Right colectomy  with a 3.7 cm tumor, 17 lymph nodes positive, margins free.  Initial  hemoglobin 11, hematocrit 33, white count 3800.  CMET was normal.  A CEA was  high at 5.7 (normal 0 to 5.0).  She was A positive and not transfused.   HOSPITAL COURSE:  On the morning of admission, the patient underwent a right  colectomy without complications.  Postoperatively she did very well. She was  ambulatory, comfortable.  She was tolerating a diet.  Her catheter, oxygen  and IV were removed on schedule.  On her second postoperative day, Dr. Riley Churches saw her in consultation.  She continued to improve and on her  fourth postoperative day, was discharged.  She was allowed home on a regular  diet, limited activity, analgesics and to continue her iron.  She will be  seen later by Dr. Ranell Patrick for her orthopedic problems, Dr. Evette Cristal in follow-up  with colonoscopy next year, Dr. Kevan Ny for her regular care.  Dr. Cyndie Chime  has set up an appointment for consultation.   DISCHARGE DIAGNOSIS:  Carcinoma right colon.   OPERATION:  Right colectomy.   CONSULTATION:  Genene Churn. Cyndie Chime, M.D.   COMPLICATIONS/INFECTIONS:  None.   CONDITION ON DISCHARGE:  Good.             ___________________________________________  Gita Kudo, M.D.    MRL/MEDQ  D:  06/19/2004  T:  06/19/2004  Job:  [id number]   cc:   Genene Churn. Cyndie Chime, M.D.  [address] [facility]  [place]  [place] [address]  Fax: [phone]   Graylin Shiver, M.D.  [address]  [address]  [place], [place] [address]  Fax: [phone]   Duncan Dull, M.D.  [address]  [place]  [place] [address]  Fax: [phone]   Almedia Balls. Ranell Patrick, M.D.  [facility]  [address]  [address]  [place]  [place] [address]  Fax: [phone]

## 2010-06-19 NOTE — Discharge Summary (Signed)
NAME:  Lorraine May, Lorraine May                           ACCOUNT NO.:  [id number]   MEDICAL RECORD NO.:  [id number]                   PATIENT TYPE:  INP   LOCATION:  9304                                 FACILITY:  [facility]   PHYSICIAN:  Freddy Finner, M.D.                DATE OF BIRTH:  Jun 29, 1955   DATE OF ADMISSION:  02/05/2002  DATE OF DISCHARGE:  02/07/2002                                 DISCHARGE SUMMARY   DISCHARGE DIAGNOSES:  1. Uterine leiomyomata.  2. Clinical symptoms of postcoital bleeding.  3. Dysfunctional uterine bleeding.   OPERATIVE PROCEDURE:  1. Laparoscopically assisted vaginal hysterectomy.  2. Bilateral salpingo-oophorectomy.   FINDINGS:  Please note that the uterus was 500 g on weighing in the  operating room.   POSTOPERATIVE COMPLICATIONS:  Fever suspected secondary to cuff cellulitis.   OTHER COMPLICATIONS:  None.   DISPOSITION:  The patient was in satisfactory improved condition at the time  of her discharge.  She is to have progressively increased physical activity,  but no vaginal entry, no heavy lifting.  She is to return to the office in  approximately 10 days for her first postoperative visit.  She is to take a  regular diet.  She is given Percocet to be taken as needed for postoperative  pain.  She is to use a Vivelle patch 0.1 mg twice weekly.  She is to  complete three additional days of antibiotic, specifically Augmentin 875 mg  b.i.d. with food.  She is to report recurrent fever, severe pain.   Details of the present illness, past history, review of systems, and  physical examination are recorded in the admission note.  The patient's  physical findings on admission were primarily remarkable for enlargement of  the uterus which was confirmed by ultrasound showing uterine leiomyomata.   LABORATORY DATA:  Normal hemoglobin of 12.8 on admission, white count of  5.5, normal platelet count.  Prothrombin time and PTT were normal on  admission as was  urinalysis.  Postoperatively hemoglobin was 10.5 on the  first postoperative day and 9.9 on the second postoperative day.  Her white  count was 9.5 on the first postoperative day and 6.1 on the second  postoperative day which was the day of discharge.   HOSPITAL COURSE:  The patient was admitted on morning of surgery and above  described operative procedure was accomplished.  Postoperatively she did  have temperature elevation of 101.5 on the afternoon of the first  postoperative day.  It was felt that her fever was most likely from cuff  cellulitis.  She was started on Unasyn IV and rapidly defervesced with fall  in the white count by the following morning and she remained afebrile  throughout the  remainder of her hospital stay.  There were no other complications and at  the time of her discharge she was completely afebrile, was ambulating  without  difficulty, having adequate bowel and bladder function.  She was  discharged home with disposition as noted above.                                               Freddy Finner, M.D.    WRN/MEDQ  D:  03/20/2002  T:  03/20/2002  Job:  [id number]

## 2010-06-19 NOTE — Op Note (Signed)
[name], Lorraine May                 ACCOUNT NO.:  [id number]   MEDICAL RECORD NO.:  [id number]          PATIENT TYPE:  AMB   LOCATION:  ENDO                         FACILITY:  [facility]   PHYSICIAN:  Graylin Shiver, M.D.   DATE OF BIRTH:  1955/03/22   DATE OF PROCEDURE:  04/08/2004  DATE OF DISCHARGE:                                 OPERATIVE REPORT   PROCEDURE:  Colonoscopy with biopsy.   ENDOSCOPIST:  Graylin Shiver, M.D.   INDICATIONS FOR PROCEDURE:  Newly-discovered iron-deficiency anemia.   INFORMED CONSENT:  An informed consent was obtained after explanation of the  risks of bleeding, infection and perforation.   PREMEDICATION:  The procedure was done immediately after an EGD, with an  additional 25 mcg of fentanyl and 4 milligrams of Versed given.   PROCEDURE:  With the patient in the left lateral decubitus position a rectal  exam was performed. No masses were felt. The Olympus colonoscope was  inserted into the rectum and advanced around the colon to the cecum.  Cecal  landmarks were identified.  There was a large mass-like tumor just distal to  the ileocecal valve at the junction of the ascending colon and cecum  compatible with cancer. Biopsies were obtained. The transverse colon looked  normal. The descending colon, sigmoid and rectum looked normal.   She tolerated the procedure well without complications.   IMPRESSION:  A mass-like lesion at the junction of the descending colon and  cecum, just distal to the ileocecal valve. This was biopsied. The appearance  is compatible with colon cancer.   PLAN:  The biopsies will be checked. A surgical referral will be made.      SFG/MEDQ  D:  04/08/2004  T:  04/08/2004  Job:  [id number]   cc:   Duncan Dull, M.D.  [address]  [place]  [place] [address]  Fax: [phone]

## 2010-06-19 NOTE — Op Note (Signed)
NAME:  Lorraine May, Lorraine May                           ACCOUNT NO.:  [id number]   MEDICAL RECORD NO.:  [id number]                   PATIENT TYPE:  OBV   LOCATION:  9399                                 FACILITY:  [facility]   PHYSICIAN:  Freddy Finner, M.D.                DATE OF BIRTH:  1955-10-30   DATE OF PROCEDURE:  02/05/2002  DATE OF DISCHARGE:                                 OPERATIVE REPORT   PREOPERATIVE DIAGNOSES:  1. Fibroids.  2. Dysfunctional bleeding.   POSTOPERATIVE DIAGNOSES:  1. Fibroids.  2. Dysfunctional bleeding.   OPERATIVE PROCEDURE:  1. Laparoscopically assisted vaginal hysterectomy.  2. Bilateral salpingo-oophorectomy.  Please note that the uterus weighed     greater than 500 g.   ANESTHESIA:  General endotracheal.   ESTIMATED INTRAOPERATIVE BLOOD LOSS:  150 cc.   PACKS, DRAINS, AND CATHETERS:  Foley.   INTRAOPERATIVE COMPLICATIONS:  None.   PROCEDURE:  Details of the present illness recorded in admission note.  The  patient is admitted on the morning of surgery, brought to the operating  room, placed under adequate general endotracheal anesthesia.  Placed in the  dorsal lithotomy position using Allen stirrups system.  Betadine prep was  carried out using __________ solution of abdomen, perineum, and vagina.  Bladder was evacuated with [facility] catheter.  Cervix was visualized with a  bivalve speculum.  Hulka tenaculum attached without difficulty.  Sterile  drapes were applied.  Two small incisions were made, one at the umbilicus  and one just above the symphysis.  __________  11-mm trocar was introduced  in the umbilical incision while elevating the anterior abdominal wall  manually.  Direct inspection revealed adequate placement with no evidence of  injury on entry.  Pneumoperitoneum was allowed to accumulate with carbon  dioxide gas.  A 5 mm trocar was placed through the lower incision under  direct visualization.  A blunt probe and later grasping forceps  were used  through the lower trocar as well as the Nezhat irrigation system used at the  conclusion of the procedure.  Careful systematic examination of abdominal  and pelvic contents was carried out.  Significant findings were recorded in  still photographs which are retained in the office record.  This included  marked enlargement of the uterus.  There were some filmy adhesions in the  right ovary to the right pelvic side wall which were cleared with blunt  probe.  Tubes and ovaries otherwise normal.  Other peritoneal surfaces were  normal.  Appendix was not visualized.  Upper abdomen appeared to be normal.  The liver and gallbladder were visualized and appeared to be normal.  Using  the gyrus tripolar device through the operating channel of the laparoscope  and grasping forceps to elevate the adnexae, the infundibulopelvic and upper  broad ligaments were progressively fulgurated and divided.  This was carried  down to a level just  above the vessels on each side.  Attention was then  turned vaginally.  Posterior weighted vaginal retractor was placed.  Deavers  were used anterior and laterally for retraction.  Cervix was grasped with a  Christella Hartigan' tenaculum.  Colpotomy incision was made while tenting the mucosa  posterior to the cervix with an Allis.  The cervix was circumscribed with a  scalpel to release the mucosa.  The bladder was advanced off the cervix.  The uterosacral pedicles were retracted with the gyrus, Heaney type clamped  and sealed and divided.  Bladder pillars were taken separately, sealed and  divided.  The cardinal ligament pedicles were taken separately, sealed and  divided.  Anterior peritoneum was entered.  Vessel pedicles were taken and  an additional pedicle on each side above the vessels was taken with the  gyrus system.  Coring was required to remove the uterus which weighed more  than 500 g.  Angles of the vagina were grasped with Allis.  Each was  anchored to the  uterosacral with a mattress suture of 0 Monocryl.  Figure-of-  eight was required on the right uterosacral and just above for hemostasis.  Uterosacrals were plicated.  Posterior peritoneum closed with an interrupted  figure-of-eight of 0 Monocryl.  Cuff was closed vertically with figure-of-  eight of 0 Monocryl.  Foley catheter was placed.  Reinspection abdominally  was carried out.  Irrigation was carried out using the Nezhat system.  Adequate complete hemostasis was documented.  Irrigating solution was  aspirated from the abdomen.  Gas was allowed to escape.  Incisions were then  closed with interrupted subcuticular sutures of 3-0 Dexon.  The 0.25%  Marcaine was injected through incision sites for postoperative analgesia.  The 1/4 inch Steri-Strips were applied to the lower incision.                                               Freddy Finner, M.D.    WRN/MEDQ  D:  02/05/2002  T:  02/05/2002  Job:  [id number]

## 2010-06-19 NOTE — Consult Note (Signed)
NAMEHUXLEY, May                 ACCOUNT NO.:  [id number]   MEDICAL RECORD NO.:  [id number]          PATIENT TYPE:  INP   LOCATION:  5710                         FACILITY:  [facility]   PHYSICIAN:  Genene Churn. Granfortuna, M.D.DATE OF BIRTH:  10/21/1955   DATE OF CONSULTATION:  04/22/2004  DATE OF DISCHARGE:                                   CONSULTATION   MEDICAL ONCOLOGY CONSULTATION:   HISTORY OF PRESENT ILLNESS:  This is a Medical Oncology Consultation of this  55 year old woman with newly diagnosed colon cancer.  [name] is a 29-  year-old Caucasian woman in overall excellent health.  She was going to have  elective knee surgery three weeks ago.  Preoperative laboratory showed a  hemoglobin of 7.5.  She was referred to Dr. Evette Cristal and underwent a  colonoscopy on April 08, 2004.  She was found to have a large mass just  distal to the ileocecal valve at the junction of the ascending colon and the  cecum.  Biopsies showed invasive adenocarcinoma.  Upper endoscopy was also  done at that time and showed reactive gastritis in the antrum, otherwise,  normal.  There was some benign gastric polyps.  She was negative for  helicobacter.   She was referred to Dr. Jerelene Redden.  She underwent a right hemicolectomy  April 20, 2004, at [facility].  Pathology shows a 3.7 cm,  moderately differentiated adenocarcinoma, with a full bowel wall penetration  and focal invasion into pericolonic adipose tissue.  Surgical margines are  all free of tumor.  There was no vascular or lymphatic invasion, and 17  lymph nodes are negative for tumor.  A preoperative chest x-ray and a CT  scan of the abdomen and pelvis are normal.  Preop CEA 5.7.   Remarkably, she denied any weakness, fatigue, hematochezia, melena,  abdominal pain or cramps or change in bowel habit.  She did notice a craving  for ice in the last few weeks.   PAST MEDICAL HISTORY:  1.  Strabismus surgery as a child.  2.  Surgery for  sinus problems.  3.  Hysterectomy two years ago, done for fibroids.  4.  Labs one year ago and not told any significant anemia.  5.  No history of any hypertension, MI, emphysema, asthma, ulcers,      hepatitis, yellow jaundice (although, she did receive gammaglobulin      injections for previous exposure).  No kidney stones, thyroid trouble,      diabetes.  No history of blood clots.  6.  Depression.   PREHOSPITAL MEDICATIONS:  1.  Wellbutrin XL 150 mg daily.  2.  Cymbalta 30 mg daily.  3.  Multivitamin and iron.  4.  Xanax 0.25 mg p.r.n.   FAMILY HISTORY:  A maternal grandmother had colon cancer in her mid 66's.  Three half uncles on her mother's side have various cancers, one of the  throat, one of the bladder, the other possibly gallbladder.  She has one  sister who is overall healthy and two brothers.   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  She is married.  No tobacco, occasional alcohol.  One son 34 years old,  Archivist at [employer].   PHYSICAL EXAMINATION:  Deferred at this time.   IMPRESSION:  A 55 year old woman presents with asymptomatic microcytic  anemia and is found to have a 4 cm, moderately differentiated adenocarcinoma  of the cecum.  She has a pathologic stage II T3N0M0 lesion.   Although we do not recommend chemotherapy for the routine stage II patient,  in view of her young age, I think it is appropriate to put this lady on a  chemotherapy adjuvant chemotherapy program.  I discussed the diagnosis,  prognosis and overview of treatment of colon cancer with the patient  together with her husband.  I will arrange outpatient follow up to discuss  and appropriate program tailored for her situation.  We do have an available  clinical  trial, and I will assess her eligibility.  We are looking at using the  biological agents such as Avastin and Erbitux in combination with  chemotherapy in the adjuvant setting at this  time.   Thank you for this consultation.      JMG/MEDQ  D:  04/22/2004  T:  04/23/2004  Job:  [id number]   cc:   Gita Kudo, M.D.  [address], [address]  [place]  [place] [address]   Duncan Dull, M.D.  [address]  [place]  [place] [address]  Fax: [phone]   Almedia Balls. Ranell Patrick, M.D.  [facility]  [address]  [address]  [place]  [place] [address]  Fax: [phone]   W. Varney Baas, M.D.  Fax: [phone]   Graylin Shiver, M.D.  [address]  [address]  [place], [place] [address]  Fax: [phone]

## 2010-06-19 NOTE — Discharge Summary (Signed)
NAMELEIDI, ASTLE                 ACCOUNT NO.:  [id number]   MEDICAL RECORD NO.:  [id number]          PATIENT TYPE:  INP   LOCATION:  1314                         [facility]ACILITY:  [facility]   PHYSICIAN:  Genene Churn. Granfortuna, M.D.DATE OF BIRTH:  14-Jun-1955   DATE OF ADMISSION:  10/23/2004  DATE OF DISCHARGE:  10/27/2004                                 DISCHARGE SUMMARY   Ms. Lorraine May  is a 55 year old woman diagnosed with stage II colon cancer in  March of this year. Please see history and physical for details. She  underwent a right hemicolectomy April 20, 2004. The tumor did invade through  the bowel wall but lymph glands were negative. She started chemotherapy with  the FOLFOX regimen but treatment had to be stopped after three cycles due to  poor tolerance. She began oral Xeloda in [id number]. She presented on the day  of admission with a 3-week history of diarrhea and abdominal pain. She had  called our office a few days earlier with these complaints. We advised her  to stop all the Xeloda, keep herself hydrated, and take Imodium. However,  she developed a low grade fever to 100.5 degrees and then abdominal pain and  cramps. Nausea but no vomiting; but unable to maintain good oral hydration  as an outpatient and she was admitted for further evaluation.   Initial examination by Dr. Dalene Carrow , she was afebrile. Pertinent findings  limited to the abdomen which was diffusely tender, more so in the right  upper quadrant and epigastrium.   LABORATORY DATA:  Lab database showed a white count of 8600, hemoglobin of  13.7, platelets 309,000, minor elevations of SGOT 45 and SGPT 48, sodium of  130, potassium 3.7, BUN 9, creatinine 1.   HOSPITAL COURSE:  She was started on parenteral hydration and antiemetics.  Stools were cultured and within 24 hours of admission were positive for C  difficile toxin. A course of Flagyl was initiated.   Abdominal symptoms resolved rapidly. She continued to have  frequent loose  bowel movements. Blood cultures and urine cultures remained sterile at 72  hours. No ova and parasites seen in the stool specimens. Regular x-rays of  the abdomen showed some right-sided abdominal and upper pelvic air fluid  levels without significant small bowel distension or gross obstruction.  Abdominal ultrasound was unremarkable.   Stool slowly began to form. Abdominal pain resolved rapidly. Laboratory  remained stable.   I felt she was stable at that point for discharge to continue oral Flagyl as  an outpatient.   CONSULTATIONS:  None.   PROCEDURES:  Abdominal ultrasound.   DIAGNOSES:  1.  Chemotherapy induced C difficile colitis.  2.  Stage II colon cancer.  3.  Depression on chronic treatment.   DISPOSITION:  Condition stable at time of discharge. Resume regular  activity, light low-fat diet.   MEDICATIONS:  1.  Flagyl 500 milligrams p.o. t.i.d. x 5days, b.i.d. x5 days, daily x5      days.  2.  Imodium or Pepto-Bismol p.r.n. Maximum eight Imodium tablets per 24  hours.  3.  Cymbalta 30 mg daily.  4.  Xanax 0.25 mg q.6 h p.r.n. anxiety.  5.  Compazine 10 mg q.6 h p.r.n. nausea or vomiting. ]  6.  She is instructed to hold Xeloda chemotherapy until further notice.   FOLLOW-UP:  In my office next week on November 04, 2004.      Genene Churn. Cyndie Chime, M.D.  Electronically Signed     JMG/MEDQ  D:  10/27/2004  T:  10/28/2004  Job:  [id number]   cc:   Duncan Dull, M.D.  Fax: [phone]   Gita Kudo, M.D.  [address]  [place]  [place] [address]

## 2010-06-19 NOTE — H&P (Signed)
Lorraine May, Lorraine May                 ACCOUNT NO.:  [id number]   MEDICAL RECORD NO.:  [id number]          PATIENT TYPE:  INP   LOCATION:  1326                         [facility]ACILITY:  [facility]   PHYSICIAN:  Lauretta I. Odogwu, M.D.DATE OF BIRTH:  1955-03-31   DATE OF ADMISSION:  10/23/2004  DATE OF DISCHARGE:                                HISTORY & PHYSICAL   CHIEF COMPLAINT:  Diarrhea and abdominal pain.   HISTORY OF PRESENT ILLNESS:  Lorraine May is a 55 year old woman who was  diagnosed with stage II colon cancer [id number]. She initially presented with  a hemoglobin of 7.5. She was referred to Dr. Evette Cristal and underwent a  colonoscopy on March8,2006. She was found to have a large mass just distal  to the ileocecal valve at the junction of the ascending colon and the cecum.  Biopsy showed invasive adenocarcinoma. She underwent a right hemicolectomy  procedure on March20,2006. Pathology showed a 3.7 cm moderately  differentiated adenocarcinoma with full bowel wall penetration and focal  invasion into pericolonic adipose tissue. Surgical margins were free of  tumor. There was no vascular or lymphatic invasion. Seventeen lymph nodes  were negative for cancer. She began adjuvant chemotherapy on the FOLFOX  regimen May17, 2006. She completed three cycles. The FOLFOX was discontinued  after the third cycle secondary to poor tolerance. She began Xeloda in early  July2006. To date, she has completed four cycles. The dose of the Xeloda was  escalated to 2000 mg q.a.m. and 1500 mg q.p.m. of beginning with cycle #4 on  October 05, 2004.   Lorraine May presents to the office today with a 3-week history of diarrhea  and abdominal pain. She is having five to six loose stools per day of  varying volumes. She has taken Imodium intermittently with some improvement.  She completed the most recent cycle of Xeloda on October 18, 2004. Oral  intake is poor. She has been nauseated for the past several days. No  emesis.  She had a fever of 100.2 degrees two nights ago and 100.5 degrees last  night. She had no associated shaking chills. She feels weak. She denies any  bloody or black bowel movements. No hematuria or dysuria. She is having no  shortness of breath or cough. She denies any problems related to the Port-A-  Cath.   PAST MEDICAL HISTORY:  1.  History of stage II colon cancer as outlined above.  2.  Depression.  3.  Strabismus surgery as a child.  4.  Hysterectomy 2 years ago secondary to fibroids.  5.  History of sinus surgery.   CURRENT MEDICATIONS:  1.  Wellbutrin XL 150 mg daily.  2.  Cymbalta 30 mg daily.  3.  Compazine 10 mg every 6 hours as needed.  4.  Vitamin B6 one tablet three times daily.  5.  Tylenol 325 mg as needed.  6.  Xanax 0.25 mg as needed.  7.  Lunesta q.h.s. p.r.n.  8.  Imodium p.r.n.   ALLERGIES:  1.  SULFA.  2.  TETRACYCLINE.  3.  DILAUDID.  4.  MORPHINE.  FAMILY HISTORY:  Maternal grandmother with colon cancer in her mid 53s.  Three half-uncles on her mother's side of the family have had various  cancers; one of the throat, one of the bladder, and the other possibly  gallbladder.   SOCIAL HISTORY:  Lorraine May lives in [place]. She is married. No tobacco  or EtOH use. She is a retired Airline pilot.   REVIEW OF SYSTEMS:  Lorraine May presents today with a 3-week history of  diarrhea and abdominal pain. She is having five to six loose stools per day.  She has been nauseated for the past several days. No vomiting. Fever to  101.5 last night; 100.2 degrees two nights ago. She feels weak. No hematuria  or dysuria. No shortness of breath or cough. No pain or redness at the [facility]-  A-Cath site.   PHYSICAL EXAMINATION:  VITAL SIGNS:  Temperature 98.4, heart rate 95,  respirations 18, blood pressure 121/76.  GENERAL:  Pleasant Caucasian female in no acute distress.  HEENT:  Normocephalic, atraumatic. Pupils equal, round and reactive to  light.  Extraocular movements are intact. Sclerae anicteric. Oropharynx is  without thrush or ulceration.  CHEST:  Lungs clear bilaterally; no wheezes or rales.  CARDIOVASCULAR:  Regular, tachycardiac.  ABDOMEN:  Soft. Diffusely tender, greatest at the right upper quadrant and  epigastrium.  EXTREMITIES:  No cyanosis, clubbing or edema. Hands and feet are dry with  some peeling.  SKIN:  Pale, warm.  NEUROLOGIC:  Alert and oriented. Motor strength 5/5. DTRs 2+, symmetric.   LABORATORY DATA:  Hemoglobin of 13.7; white count 8.6; platelet count  309,000. Sodium 130, potassium 3.7, chloride 94, CO2 26, glucose 112, BUN 9,  creatinine 1, total bilirubin 1, alkaline phosphatase 87, SGOT 45, SGPT 48,  total protein 6.7, albumin 3.5, calcium 8.5.   IMPRESSION:  1.  History of stage II colon cancer, currently being treated with adjuvant      Xeloda therapy.  2.  Diarrhea, grade 3, secondary to Xeloda, associated with dehydration.  3.  Abdominal pain.  4.  Fever.  5.  Depression.   PLAN:  1.  Admit to the oncology unit at [facility].  2.  Initiate intravenous hydration.  3.  Plain film of the abdomen.  4.  Abdominal ultrasound secondary to right upper quadrant tenderness.  5.  Check amylase and lipase.  6.  Stool for C difficile, routine culture, and O&P.  7.  Pan culture and begin broad-spectrum antibiotics.  8.  Begin Imodium after abdominal films.   Lorraine May is a Full Code.   The patient interviewed and examined by Dr. Dalene Carrow; plan reviewed.      Lorraine May, N.P.      Vicente Serene I. Odogwu, M.D.  Electronically Signed    LT/MEDQ  D:  10/23/2004  T:  10/23/2004  Job:  [id number]

## 2010-06-19 NOTE — Op Note (Signed)
[name], Lorraine May                 ACCOUNT NO.:  [id number]   MEDICAL RECORD NO.:  [id number]          PATIENT TYPE:  AMB   LOCATION:  DSC                          FACILITY:  [facility]   PHYSICIAN:  Gita Kudo, M.D. DATE OF BIRTH:  October 12, 1955   DATE OF PROCEDURE:  06/04/2004  DATE OF DISCHARGE:                                 OPERATIVE REPORT   OPERATIVE PROCEDURE:  Insertion indwelling venous access system - Export.   SURGEON:  Gita Kudo, M.D.   ANESTHESIA:  MAC - IV sedation plus 1% Xylocaine.   PREOPERATIVE DIAGNOSIS:  Carcinoma colon, post resection - 6 weeks.   OPERATIVE INDICATIONS:  This is a 55 year old lady who had a well  differentiated carcinoma that was about 4 cm in size.  Lymph nodes were  negative.  She will require some adjuvant therapy and placement of a port  for this.   OPERATIVE FINDINGS:  The left subclavian vein was punctured without incident  and the catheter appeared to be in good position by flushing and fluoroscopy  at the end of the procedure.   OPERATIVE PROCEDURE:  Having received 1.0 gram of Ancef, the patient was  given IV sedation and prepped, draped and positioned in the standard  fashion.  1% Xylocaine was used for local analgesia.  The left subclavian  puncture made and J-wire let down into the right atrium as confirmed by  fluoroscopy.  Then a tunnel was made to a pocket that was developed in the  deep subcu inferior and somewhat medial to the puncture site.  The [facility]  system was checked for size and fit in the cavity nicely.  Then the  introducer was placed from the dilated puncture site to the pocket and the  catheter brought through and cut to the appropriate length.  It was fastened  to the reservoir and secured with 2-0 Prolene sutures.  The system was  flushed with dilute heparin.  The dilator and introducer were placed over  the J-wire and inserted. Fluoroscopy showed them to be in good position.  The J-wire and dilator  were withdrawn, leaving the introducer in place.  Catheter placed through the introducer which was then peeled away, leaving  the catheter in good position as confirmed by fluoroscopy and blood return.  It looked as though it was at the superior vena cava.   Following this, the catheter was noted to be a little redundant and the  puncture site was extended and the catheter inserted further into the vein  and lay in good position.  Then the wounds were closed with 3-0 and 4-0  Vicryl, followed by Steri-Strips.  X-rays will be obtained and she will  receive her chemotherapy per Dr. Cyndie Chime.      MRL/MEDQ  D:  06/04/2004  T:  06/04/2004  Job:  [id number]   cc:   Genene Churn. Cyndie Chime, M.D.  [address] [facility]  [place]  [place] [address]  Fax: [phone]

## 2010-08-18 ENCOUNTER — Other Ambulatory Visit: Payer: Self-pay | Admitting: Oncology

## 2010-08-18 ENCOUNTER — Encounter (HOSPITAL_BASED_OUTPATIENT_CLINIC_OR_DEPARTMENT_OTHER): Payer: BC Managed Care – PPO | Admitting: Oncology

## 2010-08-18 DIAGNOSIS — C180 Malignant neoplasm of cecum: Secondary | ICD-10-CM

## 2010-08-18 LAB — CBC WITH DIFFERENTIAL/PLATELET
BASO%: 0.5 % (ref 0.0–2.0)
Basophils Absolute: 0 10*3/uL (ref 0.0–0.1)
EOS%: 3.4 % (ref 0.0–7.0)
Eosinophils Absolute: 0.1 10*3/uL (ref 0.0–0.5)
HCT: 37.2 % (ref 34.8–46.6)
HGB: 12.9 g/dL (ref 11.6–15.9)
LYMPH%: 37.1 % (ref 14.0–49.7)
MCH: 32.3 pg (ref 25.1–34.0)
MCHC: 34.6 g/dL (ref 31.5–36.0)
MCV: 93.4 fL (ref 79.5–101.0)
MONO#: 0.3 10*3/uL (ref 0.1–0.9)
MONO%: 8 % (ref 0.0–14.0)
NEUT#: 2.2 10*3/uL (ref 1.5–6.5)
NEUT%: 51 % (ref 38.4–76.8)
Platelets: 227 10*3/uL (ref 145–400)
RBC: 3.98 10*6/uL (ref 3.70–5.45)
RDW: 14.3 % (ref 11.2–14.5)
WBC: 4.2 10*3/uL (ref 3.9–10.3)
lymph#: 1.6 10*3/uL (ref 0.9–3.3)

## 2010-08-18 LAB — COMPREHENSIVE METABOLIC PANEL
ALT: 11 U/L (ref 0–35)
AST: 16 U/L (ref 0–37)
Albumin: 4 g/dL (ref 3.5–5.2)
Alkaline Phosphatase: 75 U/L (ref 39–117)
BUN: 19 mg/dL (ref 6–23)
CO2: 30 mEq/L (ref 19–32)
Calcium: 9.3 mg/dL (ref 8.4–10.5)
Chloride: 103 mEq/L (ref 96–112)
Creatinine, Ser: 1.06 mg/dL (ref 0.50–1.10)
Glucose, Bld: 90 mg/dL (ref 70–99)
Potassium: 4.1 mEq/L (ref 3.5–5.3)
Sodium: 138 mEq/L (ref 135–145)
Total Bilirubin: 0.3 mg/dL (ref 0.3–1.2)
Total Protein: 7.2 g/dL (ref 6.0–8.3)

## 2010-08-18 LAB — CEA: CEA: 1.6 ng/mL (ref 0.0–5.0)

## 2010-08-18 LAB — LACTATE DEHYDROGENASE: LDH: 168 U/L (ref 94–250)

## 2010-08-25 ENCOUNTER — Encounter (HOSPITAL_BASED_OUTPATIENT_CLINIC_OR_DEPARTMENT_OTHER): Payer: BC Managed Care – PPO | Admitting: Oncology

## 2010-08-25 DIAGNOSIS — C180 Malignant neoplasm of cecum: Secondary | ICD-10-CM

## 2011-01-09 ENCOUNTER — Telehealth: Payer: Self-pay | Admitting: Oncology

## 2011-01-09 NOTE — Telephone Encounter (Signed)
S/w the pt regarding the July 2013 appts

## 2011-08-17 ENCOUNTER — Other Ambulatory Visit (HOSPITAL_BASED_OUTPATIENT_CLINIC_OR_DEPARTMENT_OTHER): Payer: BC Managed Care – PPO | Admitting: Lab

## 2011-08-17 DIAGNOSIS — C180 Malignant neoplasm of cecum: Secondary | ICD-10-CM

## 2011-08-17 LAB — CBC WITH DIFFERENTIAL/PLATELET
BASO%: 0.8 % (ref 0.0–2.0)
Basophils Absolute: 0 10*3/uL (ref 0.0–0.1)
EOS%: 3 % (ref 0.0–7.0)
Eosinophils Absolute: 0.2 10*3/uL (ref 0.0–0.5)
HCT: 38.6 % (ref 34.8–46.6)
HGB: 13.1 g/dL (ref 11.6–15.9)
LYMPH%: 32.1 % (ref 14.0–49.7)
MCH: 31.8 pg (ref 25.1–34.0)
MCHC: 33.8 g/dL (ref 31.5–36.0)
MCV: 94.1 fL (ref 79.5–101.0)
MONO#: 0.4 10*3/uL (ref 0.1–0.9)
MONO%: 7 % (ref 0.0–14.0)
NEUT#: 2.9 10*3/uL (ref 1.5–6.5)
NEUT%: 57.1 % (ref 38.4–76.8)
Platelets: 228 10*3/uL (ref 145–400)
RBC: 4.1 10*6/uL (ref 3.70–5.45)
RDW: 13.6 % (ref 11.2–14.5)
WBC: 5.1 10*3/uL (ref 3.9–10.3)
lymph#: 1.6 10*3/uL (ref 0.9–3.3)

## 2011-08-17 LAB — COMPREHENSIVE METABOLIC PANEL
ALT: 11 U/L (ref 0–35)
AST: 15 U/L (ref 0–37)
Albumin: 3.7 g/dL (ref 3.5–5.2)
Alkaline Phosphatase: 69 U/L (ref 39–117)
BUN: 23 mg/dL (ref 6–23)
CO2: 28 mEq/L (ref 19–32)
Calcium: 9.3 mg/dL (ref 8.4–10.5)
Chloride: 102 mEq/L (ref 96–112)
Creatinine, Ser: 1.24 mg/dL — ABNORMAL HIGH (ref 0.50–1.10)
Glucose, Bld: 96 mg/dL (ref 70–99)
Potassium: 4.2 mEq/L (ref 3.5–5.3)
Sodium: 139 mEq/L (ref 135–145)
Total Bilirubin: 0.3 mg/dL (ref 0.3–1.2)
Total Protein: 7 g/dL (ref 6.0–8.3)

## 2011-08-17 LAB — LACTATE DEHYDROGENASE: LDH: 173 U/L (ref 94–250)

## 2011-08-17 LAB — CEA: CEA: 1.7 ng/mL (ref 0.0–5.0)

## 2011-08-24 ENCOUNTER — Telehealth: Payer: Self-pay | Admitting: *Deleted

## 2011-08-24 ENCOUNTER — Encounter: Payer: Self-pay | Admitting: Oncology

## 2011-08-24 ENCOUNTER — Ambulatory Visit (HOSPITAL_BASED_OUTPATIENT_CLINIC_OR_DEPARTMENT_OTHER): Payer: BC Managed Care – PPO | Admitting: Oncology

## 2011-08-24 VITALS — BP 110/65 | HR 69 | Temp 98.6°F | Ht 65.0 in | Wt 163.9 lb

## 2011-08-24 DIAGNOSIS — F329 Major depressive disorder, single episode, unspecified: Secondary | ICD-10-CM

## 2011-08-24 DIAGNOSIS — E785 Hyperlipidemia, unspecified: Secondary | ICD-10-CM

## 2011-08-24 DIAGNOSIS — F419 Anxiety disorder, unspecified: Secondary | ICD-10-CM

## 2011-08-24 DIAGNOSIS — F32A Depression, unspecified: Secondary | ICD-10-CM | POA: Insufficient documentation

## 2011-08-24 DIAGNOSIS — Z85038 Personal history of other malignant neoplasm of large intestine: Secondary | ICD-10-CM

## 2011-08-24 DIAGNOSIS — F341 Dysthymic disorder: Secondary | ICD-10-CM

## 2011-08-24 HISTORY — DX: Hyperlipidemia, unspecified: E78.5

## 2011-08-24 HISTORY — DX: Personal history of other malignant neoplasm of large intestine: Z85.038

## 2011-08-24 HISTORY — DX: Depression, unspecified: F32.A

## 2011-08-24 HISTORY — DX: Anxiety disorder, unspecified: F41.9

## 2011-08-24 NOTE — Progress Notes (Signed)
Hematology and Oncology Follow Up Visit  Lorraine May [id number] 1955-11-19 56 y.o. 08/24/2011 4:15 PM   Principle Diagnosis: Encounter Diagnoses  Name Primary?  Marland Kitchen Hx of colon cancer, stage II Yes  . Hyperlipidemia   . Anxiety and depression      Interim History:   Followup visit for this 56 year old woman diagnosed with a stage II, grade 2, T3 N0, 3.7 cm adenocarcinoma of the cecum in March 2006. No vascular or lymphatic invasion. 17 nodes negative.  She underwent a right hemicolectomy on 04/20/04.  Preop CEA was 5.7.  She was put on adjuvant chemotherapy initially with bolus 5-FU, leucovorin, and oxaliplatin.  She was unable to tolerate the chemotherapy.  Her regimen was then modified to single agent oral Xeloda.  She took this through 01/10/2005.  She was evaluated in our genetics clinic and testing was done on her tumor sample for MLH1, MSH 2, and MSH 6 genes  and no large deletions were detected.  Her most recent colonoscopy was done in 06/08/2010 and was negative except for a small hyperplastic polyp which was removed.  She is doing well. She's had no interim medical problems. She still has irritable bowel symptoms with intermittent constipation alternating with diarrhea. No abdominal pain or cramps. No hematochezia or melena. Appetite is good.  Medications: reviewed  Allergies:  Allergies  Allergen Reactions  . Dilaudid (Hydromorphone Hcl) Itching  . Morphine And Related Itching  . Sulfa Antibiotics   . Tetracyclines & Related Nausea Only    Review of Systems: Constitutional:   No constitutional symptoms Respiratory: No cough or dyspnea Cardiovascular:  No chest pain or palpitations Gastrointestinal: See above Genito-Urinary: Not questioned Musculoskeletal: No pain Neurologic: No headache or change in vision Skin: No rash Remaining ROS negative.  Physical Exam: Blood pressure 110/65, pulse 69, temperature 98.6 F (37 C), temperature source Oral, weight 163 lb 14.4  oz (74.345 kg). Wt Readings from Last 3 Encounters:  08/24/11 163 lb 14.4 oz (74.345 kg)     General appearance: Well-nourished Caucasian woman HENNT: Pharynx no erythema or exudate Lymph nodes: No adenopathy Breasts: Not examined Lungs: Clear to auscultation resonant to percussion Heart: Regular rhythm no murmur Abdomen: Soft nontender no mass no organomegaly Extremities: No edema no calf tenderness Vascular: No cyanosis Neurologic: No focal deficit Skin: No rash or ecchymosis  Lab Results: Lab Results  Component Value Date   WBC 5.1 08/17/2011   HGB 13.1 08/17/2011   HCT 38.6 08/17/2011   MCV 94.1 08/17/2011   PLT 228 08/17/2011     Chemistry      Component Value Date/Time   NA 139 08/17/2011 1501   K 4.2 08/17/2011 1501   CL 102 08/17/2011 1501   CO2 28 08/17/2011 1501   BUN 23 08/17/2011 1501   CREATININE 1.24* 08/17/2011 1501      Component Value Date/Time   CALCIUM 9.3 08/17/2011 1501   ALKPHOS 69 08/17/2011 1501   AST 15 08/17/2011 1501   ALT 11 08/17/2011 1501   BILITOT 0.3 08/17/2011 1501       Radiological Studies: No results found.  Impression and Plan: #1. Stage II, T3 N0, adenocarcinoma of the cecum treated as outlined above. She remains free of any obvious recurrence now out over 8 years from diagnosis.  #2. Hyperlipidemia.  #3. Chronic anxiety and depression   CC:. Dr. Shaune Pollack; Dr. Wandalee Ferdinand; Dr. Abbey Chatters   Levert Feinstein, MD 7/23/20134:15 PM

## 2011-08-24 NOTE — Telephone Encounter (Signed)
Gave patient appointment for 08-15-2012 at 3:00 07-22-22014 at 4:30pm printed out calendar and gave to the patient

## 2011-08-27 ENCOUNTER — Other Ambulatory Visit: Payer: Self-pay | Admitting: Family Medicine

## 2011-08-27 DIAGNOSIS — R109 Unspecified abdominal pain: Secondary | ICD-10-CM

## 2011-09-01 ENCOUNTER — Ambulatory Visit
Admission: RE | Admit: 2011-09-01 | Discharge: 2011-09-01 | Disposition: A | Discharge status: Home / Self Care | Payer: BC Managed Care – PPO | Source: Ambulatory Visit | Attending: Family Medicine | Admitting: Family Medicine

## 2011-09-01 DIAGNOSIS — R109 Unspecified abdominal pain: Secondary | ICD-10-CM

## 2011-09-27 ENCOUNTER — Ambulatory Visit (INDEPENDENT_AMBULATORY_CARE_PROVIDER_SITE_OTHER): Payer: BC Managed Care – PPO | Admitting: General Surgery

## 2011-09-27 ENCOUNTER — Encounter (INDEPENDENT_AMBULATORY_CARE_PROVIDER_SITE_OTHER): Payer: Self-pay | Admitting: General Surgery

## 2011-09-27 ENCOUNTER — Other Ambulatory Visit: Payer: Self-pay | Admitting: Oncology

## 2011-09-27 VITALS — BP 124/72 | HR 76 | Temp 99.0°F | Resp 16 | Ht 65.0 in | Wt 165.8 lb

## 2011-09-27 DIAGNOSIS — K8010 Calculus of gallbladder with chronic cholecystitis without obstruction: Secondary | ICD-10-CM

## 2011-09-27 NOTE — Patient Instructions (Signed)

## 2011-09-27 NOTE — Progress Notes (Signed)
Chief Complaint  Patient presents with  . Pre-op Exam    eval GB w/ stones    HISTORY: Patient is a 56 year old female who presents with approximately a year of abdominal pain on and off. The pain is sometimes quite severe and sometimes last for several hours. It sometimes is only 5 or 10 minutes. It does not occur every day. She does have some associated nausea. She has tried taking constant Prilosec and this has not made the pain go away. She has not seemed to notice that the pain is worse with food or relieved with food.  She denies fevers and chills. She denies jaundice. She originally thought this was something to do with her colon cancer but these studies were all clear.  She has had an incisional hernia repair a year after her colon cancer surgery. These were done by Dr. Maryagnes Amos.  She says the only thing that seems to help the pain is rubbing her stomach.  Past Medical History  Diagnosis Date  . Hx of colon cancer, stage II 08/24/2011    T3N0  adenoca cecum resected 04/2004  Xeloda adjuvant chemo  . Hyperlipidemia 08/24/2011  . Anxiety and depression 08/24/2011  . Cancer     colon  . Arthritis     Past Surgical History  Procedure Date  . Breast surgery     reduction  . Knee arthroscopy   . Hernia repair     Current Outpatient Prescriptions  Medication Sig Dispense Refill  . ALPRAZolam (XANAX) 0.25 MG tablet Take 0.25 mg by mouth 2 (two) times daily as needed.      Marland Kitchen buPROPion (WELLBUTRIN XL) 150 MG 24 hr tablet Take 150 mg by mouth daily.      . DULoxetine (CYMBALTA) 60 MG capsule Take 60 mg by mouth daily.      . ergocalciferol (VITAMIN D2) 50000 UNITS capsule Take 50,000 Units by mouth once a week.      . Multiple Vitamin (MULTIVITAMIN) tablet Take 1 tablet by mouth daily.      . simvastatin (ZOCOR) 40 MG tablet Take 40 mg by mouth every evening.      . zolpidem (AMBIEN) 10 MG tablet Take 10 mg by mouth at bedtime as needed.         Allergies  Allergen Reactions  .  Dilaudid (Hydromorphone Hcl) Itching  . Morphine And Related Itching  . Sulfa Antibiotics   . Tetracyclines & Related Nausea Only     Family History  Problem Relation Age of Onset  . Diabetes Mother     type 1  . Alcohol abuse Father   . Cancer Paternal Aunt     germ cell  . Cancer Maternal Grandmother     colon  . Cancer Maternal Uncle     throat     History   Social History  . Marital Status: Married    Spouse Name: N/A    Number of Children: N/A  . Years of Education: N/A   Social History Main Topics  . Smoking status: Former Games developer  . Smokeless tobacco: Never Used  . Alcohol Use: Yes     1 cocktail/glass of wine per day  . Drug Use: No  . Sexually Active: None   REVIEW OF SYSTEMS - PERTINENT POSITIVES ONLY: 12 point review of systems negative other than HPI and PMH except for diarrhea and joint pain.    EXAM: Filed Vitals:   09/27/11 1328  BP: 124/72  Pulse: 76  Temp: 99 F (37.2 C)  Resp: 16    Gen:  No acute distress.  Well nourished and well groomed.   Neurological: Alert and oriented to person, place, and time. Coordination normal.  Head: Normocephalic and atraumatic.  Eyes: Conjunctivae are normal. Pupils are equal, round, and reactive to light. No scleral icterus.  Neck: Normal range of motion. Neck supple. No tracheal deviation or thyromegaly present.  Cardiovascular: Normal rate, regular rhythm, normal heart sounds and intact distal pulses.  Exam reveals no gallop and no friction rub.  No murmur heard. Respiratory: Effort normal.  No respiratory distress. No chest wall tenderness. Breath sounds normal.  No wheezes, rales or rhonchi.  GI: Soft. Bowel sounds are normal. The abdomen is soft.  There is RUQ tenderness.  There is no rebound and no guarding.  Musculoskeletal: Normal range of motion. Extremities are nontender.  Lymphadenopathy: No cervical, preauricular, postauricular or axillary adenopathy is present Skin: Skin is warm and dry. No rash  noted. No diaphoresis. No erythema. No pallor. No clubbing, cyanosis, or edema.   Psychiatric: Normal mood and affect. Behavior is normal. Judgment and thought content normal.    LABORATORY RESULTS: Available labs are reviewed  CBC, CMET near normal 7/16.  Creatinine 1.24  RADIOLOGY RESULTS: See E-Chart or I-Site for most recent results.  Images and reports are reviewed. Ultrasound IMPRESSION:  1. 1.8 cm mobile gallstone within the gallbladder. No current  evidence of acute cholecystitis.  2. No ductal dilatation.     ASSESSMENT AND PLAN: Chronic cholecystitis with calculus Pt has RUQ tenderness, gallstones, and epigastric pain. Plan lap chole with cholangiogram.    The surgical procedure was described to the patient in detail.  The patient was given Agricultural engineer. .  I discussed the incision type and location, the location of the gallbladder, the anatomy of the bile ducts and arteries, and the typical progression of surgery.  I discussed the possibility of converting to an open operation.  I advised of the risks of bleeding, infection, damage to other structures (such as the bile duct, intestine or liver), bile leak, need for other procedures or surgeries, and post op diarrhea/constipation.  We discussed the risk of blood clot.  We discussed the recovery period and post operative restrictions.  The patient was advised against taking blood thinners the week before surgery.     She is planning a cruise at the end of September.  We will try to get this done as soon as possible so that it will not interfere with her plans.        Maudry Diego MD Surgical Oncology, General and Endocrine Surgery [facility]      Visit Diagnoses: 1. Chronic cholecystitis with calculus     Primary Care Physician: Hollice Espy, MD  Cephas Darby

## 2011-09-27 NOTE — Assessment & Plan Note (Signed)
Pt has RUQ tenderness, gallstones, and epigastric pain. Plan lap chole with cholangiogram.    The surgical procedure was described to the patient in detail.  The patient was given Agricultural engineer. .  I discussed the incision type and location, the location of the gallbladder, the anatomy of the bile ducts and arteries, and the typical progression of surgery.  I discussed the possibility of converting to an open operation.  I advised of the risks of bleeding, infection, damage to other structures (such as the bile duct, intestine or liver), bile leak, need for other procedures or surgeries, and post op diarrhea/constipation.  We discussed the risk of blood clot.  We discussed the recovery period and post operative restrictions.  The patient was advised against taking blood thinners the week before surgery.     She is planning a cruise at the end of September.  We will try to get this done as soon as possible so that it will not interfere with her plans.

## 2011-09-28 ENCOUNTER — Encounter (HOSPITAL_COMMUNITY): Payer: Self-pay | Admitting: Pharmacy Technician

## 2011-09-30 ENCOUNTER — Encounter (HOSPITAL_COMMUNITY)
Admission: RE | Admit: 2011-09-30 | Discharge: 2011-09-30 | Disposition: A | Discharge status: Home / Self Care | Payer: BC Managed Care – PPO | Source: Ambulatory Visit | Attending: General Surgery | Admitting: General Surgery

## 2011-09-30 ENCOUNTER — Encounter (HOSPITAL_COMMUNITY): Payer: Self-pay

## 2011-09-30 HISTORY — DX: Nausea with vomiting, unspecified: R11.2

## 2011-09-30 HISTORY — DX: Pain in unspecified joint: M25.50

## 2011-09-30 HISTORY — DX: Major depressive disorder, single episode, unspecified: F32.9

## 2011-09-30 HISTORY — DX: Other specified postprocedural states: Z98.890

## 2011-09-30 HISTORY — DX: Depression, unspecified: F32.A

## 2011-09-30 HISTORY — DX: Anxiety disorder, unspecified: F41.9

## 2011-09-30 LAB — PROTIME-INR
INR: 0.94 (ref 0.00–1.49)
Prothrombin Time: 12.8 seconds (ref 11.6–15.2)

## 2011-09-30 LAB — CBC WITH DIFFERENTIAL/PLATELET
Basophils Absolute: 0 10*3/uL (ref 0.0–0.1)
Basophils Relative: 1 % (ref 0–1)
Eosinophils Absolute: 0.2 10*3/uL (ref 0.0–0.7)
Eosinophils Relative: 4 % (ref 0–5)
HCT: 37.2 % (ref 36.0–46.0)
Hemoglobin: 12.6 g/dL (ref 12.0–15.0)
Lymphocytes Relative: 43 % (ref 12–46)
Lymphs Abs: 2.4 10*3/uL (ref 0.7–4.0)
MCH: 31.3 pg (ref 26.0–34.0)
MCHC: 33.9 g/dL (ref 30.0–36.0)
MCV: 92.3 fL (ref 78.0–100.0)
Monocytes Absolute: 0.4 10*3/uL (ref 0.1–1.0)
Monocytes Relative: 7 % (ref 3–12)
Neutro Abs: 2.5 10*3/uL (ref 1.7–7.7)
Neutrophils Relative %: 46 % (ref 43–77)
Platelets: 231 10*3/uL (ref 150–400)
RBC: 4.03 MIL/uL (ref 3.87–5.11)
RDW: 13.3 % (ref 11.5–15.5)
WBC: 5.5 10*3/uL (ref 4.0–10.5)

## 2011-09-30 LAB — COMPREHENSIVE METABOLIC PANEL
ALT: 11 U/L (ref 0–35)
AST: 19 U/L (ref 0–37)
Albumin: 3.8 g/dL (ref 3.5–5.2)
Alkaline Phosphatase: 66 U/L (ref 39–117)
BUN: 18 mg/dL (ref 6–23)
CO2: 27 mEq/L (ref 19–32)
Calcium: 9.3 mg/dL (ref 8.4–10.5)
Chloride: 104 mEq/L (ref 96–112)
Creatinine, Ser: 1.15 mg/dL — ABNORMAL HIGH (ref 0.50–1.10)
GFR calc Af Amer: 60 mL/min — ABNORMAL LOW (ref >90)
GFR calc non Af Amer: 52 mL/min — ABNORMAL LOW (ref >90)
Glucose, Bld: 101 mg/dL — ABNORMAL HIGH (ref 70–99)
Potassium: 3.9 mEq/L (ref 3.5–5.1)
Sodium: 141 mEq/L (ref 135–145)
Total Bilirubin: 0.2 mg/dL — ABNORMAL LOW (ref 0.3–1.2)
Total Protein: 7 g/dL (ref 6.0–8.3)

## 2011-09-30 LAB — APTT: aPTT: 26 seconds (ref 24–37)

## 2011-09-30 LAB — URINALYSIS, ROUTINE W REFLEX MICROSCOPIC
Bilirubin Urine: NEGATIVE
Glucose, UA: NEGATIVE mg/dL
Hgb urine dipstick: NEGATIVE
Ketones, ur: NEGATIVE mg/dL
Leukocytes, UA: NEGATIVE
Nitrite: NEGATIVE
Protein, ur: NEGATIVE mg/dL
Specific Gravity, Urine: 1.023 (ref 1.005–1.030)
Urobilinogen, UA: 0.2 mg/dL (ref 0.0–1.0)
pH: 5.5 (ref 5.0–8.0)

## 2011-09-30 LAB — SURGICAL PCR SCREEN
MRSA, PCR: NEGATIVE
Staphylococcus aureus: NEGATIVE

## 2011-09-30 NOTE — Pre-Procedure Instructions (Signed)
[address]  09/30/2011   Your procedure is scheduled on:  10-05-2011  Report to [facility] at 5:30 AM.  Call this number if you have problems the morning of surgery: [phone]   Remember:   Do not eat food or drink:After Midnight.      Take these medicines the morning of surgery with A SIP OF WATER: alprazolam as needed,bupropion(wellbutrin)Duloxetine(Cymbalta)   Do not wear jewelry, make-up or nail polish.  Do not wear lotions, powders, or perfumes. You may wear deodorant.  Do not shave 48 hours prior to surgery. Men may shave face and neck.  Do not bring valuables to the hospital.  Contacts, dentures or bridgework may not be worn into surgery.     Patients discharged the day of surgery will not be allowed to drive home.  Name and phone number of your driver: ___________________________    Special Instructions: CHG Shower Use Special Wash: 1/2 bottle night before surgery and 1/2 bottle morning of surgery.     Please read over the following fact sheets that you were given: Pain Booklet, Coughing and Deep Breathing, MRSA Information and Surgical Site Infection Prevention

## 2011-10-01 LAB — CEA: CEA: 1.2 ng/mL (ref 0.0–5.0)

## 2011-10-04 MED ORDER — CEFOXITIN SODIUM 2 G IV SOLR
2.0000 g | INTRAVENOUS | Status: AC
  Administered 2011-10-05: 2 g via INTRAVENOUS
  Filled 2011-10-04: qty 2

## 2011-10-05 ENCOUNTER — Encounter (HOSPITAL_COMMUNITY): Payer: Self-pay | Admitting: General Surgery

## 2011-10-05 ENCOUNTER — Encounter (HOSPITAL_COMMUNITY): Payer: Self-pay | Admitting: Anesthesiology

## 2011-10-05 ENCOUNTER — Encounter (HOSPITAL_COMMUNITY)
Admission: RE | Disposition: A | Discharge status: Home / Self Care | Payer: Self-pay | Source: Ambulatory Visit | Attending: General Surgery

## 2011-10-05 ENCOUNTER — Ambulatory Visit (HOSPITAL_COMMUNITY)
Admission: RE | Admit: 2011-10-05 | Discharge: 2011-10-05 | Disposition: A | Discharge status: Home / Self Care | Payer: BC Managed Care – PPO | Source: Ambulatory Visit | Attending: General Surgery | Admitting: General Surgery

## 2011-10-05 ENCOUNTER — Ambulatory Visit (HOSPITAL_COMMUNITY): Payer: BC Managed Care – PPO | Admitting: Anesthesiology

## 2011-10-05 ENCOUNTER — Ambulatory Visit (HOSPITAL_COMMUNITY): Payer: BC Managed Care – PPO

## 2011-10-05 DIAGNOSIS — K8010 Calculus of gallbladder with chronic cholecystitis without obstruction: Secondary | ICD-10-CM

## 2011-10-05 DIAGNOSIS — Z01812 Encounter for preprocedural laboratory examination: Secondary | ICD-10-CM | POA: Insufficient documentation

## 2011-10-05 DIAGNOSIS — M129 Arthropathy, unspecified: Secondary | ICD-10-CM | POA: Insufficient documentation

## 2011-10-05 DIAGNOSIS — E785 Hyperlipidemia, unspecified: Secondary | ICD-10-CM | POA: Insufficient documentation

## 2011-10-05 DIAGNOSIS — Z85038 Personal history of other malignant neoplasm of large intestine: Secondary | ICD-10-CM | POA: Insufficient documentation

## 2011-10-05 DIAGNOSIS — F411 Generalized anxiety disorder: Secondary | ICD-10-CM | POA: Insufficient documentation

## 2011-10-05 DIAGNOSIS — F329 Major depressive disorder, single episode, unspecified: Secondary | ICD-10-CM | POA: Insufficient documentation

## 2011-10-05 DIAGNOSIS — F3289 Other specified depressive episodes: Secondary | ICD-10-CM | POA: Insufficient documentation

## 2011-10-05 HISTORY — PX: CHOLECYSTECTOMY: SHX55

## 2011-10-05 SURGERY — LAPAROSCOPIC CHOLECYSTECTOMY WITH INTRAOPERATIVE CHOLANGIOGRAM
Anesthesia: General | Wound class: Clean Contaminated

## 2011-10-05 MED ORDER — NEOSTIGMINE METHYLSULFATE 1 MG/ML IJ SOLN
INTRAMUSCULAR | Status: DC | PRN
  Administered 2011-10-05: 3 mg via INTRAVENOUS

## 2011-10-05 MED ORDER — ACETAMINOPHEN 325 MG PO TABS
650.0000 mg | ORAL_TABLET | ORAL | Status: DC | PRN
  Filled 2011-10-05: qty 2

## 2011-10-05 MED ORDER — OXYCODONE HCL 5 MG PO TABS
ORAL_TABLET | ORAL | Status: AC
  Filled 2011-10-05: qty 2

## 2011-10-05 MED ORDER — MIDAZOLAM HCL 2 MG/2ML IJ SOLN: 0.5000 mg | Freq: Once | INTRAMUSCULAR | Status: DC | PRN

## 2011-10-05 MED ORDER — ROCURONIUM BROMIDE 100 MG/10ML IV SOLN
INTRAVENOUS | Status: DC | PRN
  Administered 2011-10-05: 50 mg via INTRAVENOUS

## 2011-10-05 MED ORDER — SODIUM CHLORIDE 0.9 % IV SOLN
INTRAVENOUS | Status: DC | PRN
  Administered 2011-10-05: 09:00:00

## 2011-10-05 MED ORDER — ONDANSETRON HCL 4 MG/2ML IJ SOLN
INTRAMUSCULAR | Status: DC | PRN
  Administered 2011-10-05: 4 mg via INTRAVENOUS

## 2011-10-05 MED ORDER — BUPIVACAINE HCL (PF) 0.25 % IJ SOLN
INTRAMUSCULAR | Status: AC
  Filled 2011-10-05: qty 30

## 2011-10-05 MED ORDER — PROMETHAZINE HCL 25 MG/ML IJ SOLN: 6.2500 mg | INTRAMUSCULAR | Status: DC | PRN

## 2011-10-05 MED ORDER — SODIUM CHLORIDE 0.9 % IV SOLN: 250.0000 mL | INTRAVENOUS | Status: DC | PRN

## 2011-10-05 MED ORDER — ONDANSETRON HCL 4 MG/2ML IJ SOLN
4.0000 mg | Freq: Four times a day (QID) | INTRAMUSCULAR | Status: DC | PRN
  Administered 2011-10-05: 4 mg via INTRAVENOUS
  Filled 2011-10-05 (×2): qty 2

## 2011-10-05 MED ORDER — MIDAZOLAM HCL 5 MG/5ML IJ SOLN
INTRAMUSCULAR | Status: DC | PRN
  Administered 2011-10-05: 2 mg via INTRAVENOUS

## 2011-10-05 MED ORDER — 0.9 % SODIUM CHLORIDE (POUR BTL) OPTIME
TOPICAL | Status: DC | PRN
  Administered 2011-10-05: 2000 mL

## 2011-10-05 MED ORDER — FENTANYL CITRATE 0.05 MG/ML IJ SOLN
25.0000 ug | INTRAMUSCULAR | Status: DC | PRN
  Administered 2011-10-05 (×2): 50 ug via INTRAVENOUS

## 2011-10-05 MED ORDER — ACETAMINOPHEN 650 MG RE SUPP
650.0000 mg | RECTAL | Status: DC | PRN
  Filled 2011-10-05: qty 1

## 2011-10-05 MED ORDER — FENTANYL CITRATE 0.05 MG/ML IJ SOLN
INTRAMUSCULAR | Status: AC
  Filled 2011-10-05: qty 2

## 2011-10-05 MED ORDER — MEPERIDINE HCL 25 MG/ML IJ SOLN: 6.2500 mg | INTRAMUSCULAR | Status: DC | PRN

## 2011-10-05 MED ORDER — LIDOCAINE HCL (CARDIAC) 20 MG/ML IV SOLN
INTRAVENOUS | Status: DC | PRN
  Administered 2011-10-05: 100 mg via INTRAVENOUS

## 2011-10-05 MED ORDER — SODIUM CHLORIDE 0.9 % IR SOLN
Status: DC | PRN
  Administered 2011-10-05: 1000 mL

## 2011-10-05 MED ORDER — GLYCOPYRROLATE 0.2 MG/ML IJ SOLN
INTRAMUSCULAR | Status: DC | PRN
  Administered 2011-10-05: .4 mg via INTRAVENOUS

## 2011-10-05 MED ORDER — OXYCODONE-ACETAMINOPHEN 5-325 MG PO TABS: 1.0000 | ORAL_TABLET | ORAL | Status: AC | PRN

## 2011-10-05 MED ORDER — MEPERIDINE HCL 25 MG/ML IJ SOLN
INTRAMUSCULAR | Status: AC
  Filled 2011-10-05: qty 1

## 2011-10-05 MED ORDER — PROPOFOL 10 MG/ML IV BOLUS
INTRAVENOUS | Status: DC | PRN
  Administered 2011-10-05: 200 mg via INTRAVENOUS

## 2011-10-05 MED ORDER — LIDOCAINE HCL (PF) 1 % IJ SOLN
INTRAMUSCULAR | Status: DC | PRN
  Administered 2011-10-05: 30 mL

## 2011-10-05 MED ORDER — SODIUM CHLORIDE 0.9 % IJ SOLN: 3.0000 mL | INTRAMUSCULAR | Status: DC | PRN

## 2011-10-05 MED ORDER — SODIUM CHLORIDE 0.9 % IJ SOLN: 3.0000 mL | Freq: Two times a day (BID) | INTRAMUSCULAR | Status: DC

## 2011-10-05 MED ORDER — BUPIVACAINE-EPINEPHRINE 0.25% -1:200000 IJ SOLN
INTRAMUSCULAR | Status: DC | PRN
  Administered 2011-10-05: 30 mL

## 2011-10-05 MED ORDER — DEXAMETHASONE SODIUM PHOSPHATE 4 MG/ML IJ SOLN
INTRAMUSCULAR | Status: DC | PRN
  Administered 2011-10-05: 4 mg via INTRAVENOUS

## 2011-10-05 MED ORDER — LIDOCAINE-EPINEPHRINE (PF) 1 %-1:200000 IJ SOLN
INTRAMUSCULAR | Status: AC
  Filled 2011-10-05: qty 10

## 2011-10-05 MED ORDER — FENTANYL CITRATE 0.05 MG/ML IJ SOLN
INTRAMUSCULAR | Status: DC | PRN
  Administered 2011-10-05: 250 ug via INTRAVENOUS

## 2011-10-05 MED ORDER — LIDOCAINE HCL (PF) 1 % IJ SOLN
INTRAMUSCULAR | Status: AC
  Filled 2011-10-05: qty 30

## 2011-10-05 MED ORDER — LACTATED RINGERS IV SOLN
INTRAVENOUS | Status: DC | PRN
  Administered 2011-10-05: 07:00:00 via INTRAVENOUS

## 2011-10-05 MED ORDER — OXYCODONE HCL 5 MG PO TABS
5.0000 mg | ORAL_TABLET | ORAL | Status: DC | PRN
  Administered 2011-10-05: 10 mg via ORAL

## 2011-10-05 MED ORDER — BUPIVACAINE-EPINEPHRINE PF 0.25-1:200000 % IJ SOLN
INTRAMUSCULAR | Status: AC
  Filled 2011-10-05: qty 30

## 2011-10-05 SURGICAL SUPPLY — 39 items
APPLIER CLIP ROT 10 11.4 M/L (STAPLE) ×2
BLADE SURG ROTATE 9660 (MISCELLANEOUS) IMPLANT
CANISTER SUCTION 2500CC (MISCELLANEOUS) ×2 IMPLANT
CHLORAPREP W/TINT 26ML (MISCELLANEOUS) ×2 IMPLANT
CLIP APPLIE ROT 10 11.4 M/L (STAPLE) ×1 IMPLANT
CLOTH BEACON ORANGE TIMEOUT ST (SAFETY) ×2 IMPLANT
COVER MAYO STAND STRL (DRAPES) ×2 IMPLANT
COVER SURGICAL LIGHT HANDLE (MISCELLANEOUS) ×2 IMPLANT
DECANTER SPIKE VIAL GLASS SM (MISCELLANEOUS) ×4 IMPLANT
DERMABOND ADVANCED (GAUZE/BANDAGES/DRESSINGS) ×1
DERMABOND ADVANCED .7 DNX12 (GAUZE/BANDAGES/DRESSINGS) ×1 IMPLANT
DRAPE C-ARM 42X72 X-RAY (DRAPES) ×2 IMPLANT
DRAPE UTILITY 15X26 W/TAPE STR (DRAPE) ×4 IMPLANT
DRAPE WARM FLUID 44X44 (DRAPE) ×2 IMPLANT
ELECT REM PT RETURN 9FT ADLT (ELECTROSURGICAL) ×2
ELECTRODE REM PT RTRN 9FT ADLT (ELECTROSURGICAL) ×1 IMPLANT
FILTER SMOKE EVAC LAPAROSHD (FILTER) ×2 IMPLANT
GLOVE BIO SURGEON STRL SZ 6 (GLOVE) ×2 IMPLANT
GLOVE BIOGEL PI IND STRL 6.5 (GLOVE) ×1 IMPLANT
GLOVE BIOGEL PI INDICATOR 6.5 (GLOVE) ×1
GOWN PREVENTION PLUS XXLARGE (GOWN DISPOSABLE) ×2 IMPLANT
GOWN STRL NON-REIN LRG LVL3 (GOWN DISPOSABLE) ×6 IMPLANT
KIT BASIN OR (CUSTOM PROCEDURE TRAY) ×2 IMPLANT
KIT ROOM TURNOVER OR (KITS) ×2 IMPLANT
NS IRRIG 1000ML POUR BTL (IV SOLUTION) ×2 IMPLANT
PAD ARMBOARD 7.5X6 YLW CONV (MISCELLANEOUS) ×2 IMPLANT
POUCH SPECIMEN RETRIEVAL 10MM (ENDOMECHANICALS) ×2 IMPLANT
SCISSORS LAP 5X35 DISP (ENDOMECHANICALS) ×2 IMPLANT
SET CHOLANGIOGRAPH 5 50 .035 (SET/KITS/TRAYS/PACK) ×2 IMPLANT
SET IRRIG TUBING LAPAROSCOPIC (IRRIGATION / IRRIGATOR) ×2 IMPLANT
SLEEVE ENDOPATH XCEL 5M (ENDOMECHANICALS) ×8 IMPLANT
SPECIMEN JAR SMALL (MISCELLANEOUS) ×2 IMPLANT
SUT MNCRL AB 4-0 PS2 18 (SUTURE) ×2 IMPLANT
TOWEL OR 17X24 6PK STRL BLUE (TOWEL DISPOSABLE) ×2 IMPLANT
TOWEL OR 17X26 10 PK STRL BLUE (TOWEL DISPOSABLE) ×2 IMPLANT
TRAY LAPAROSCOPIC (CUSTOM PROCEDURE TRAY) ×2 IMPLANT
TROCAR XCEL BLUNT TIP 100MML (ENDOMECHANICALS) ×2 IMPLANT
TROCAR XCEL NON-BLD 11X100MML (ENDOMECHANICALS) ×2 IMPLANT
TROCAR XCEL NON-BLD 5MMX100MML (ENDOMECHANICALS) ×2 IMPLANT

## 2011-10-05 NOTE — Anesthesia Procedure Notes (Signed)
Procedure Name: Intubation Date/Time: 10/05/2011 7:49 AM Performed by: Marena Chancy Pre-anesthesia Checklist: Patient identified, Timeout performed, Emergency Drugs available, Suction available and Patient being monitored Patient Re-evaluated:Patient Re-evaluated prior to inductionOxygen Delivery Method: Circle system utilized Preoxygenation: Pre-oxygenation with 100% oxygen Intubation Type: IV induction Ventilation: Mask ventilation without difficulty and Oral airway inserted - appropriate to patient size Tube type: Oral Tube size: 7.5 mm Number of attempts: 1 Airway Equipment and Method: Video-laryngoscopy Secured at: 21 cm Tube secured with: Tape Dental Injury: Teeth and Oropharynx as per pre-operative assessment  Difficulty Due To: Difficult Airway- due to anterior larynx

## 2011-10-05 NOTE — H&P (View-Only) (Signed)
Chief Complaint  Patient presents with  . Pre-op Exam    eval GB w/ stones    HISTORY: Patient is a 55-year-old female who presents with approximately a year of abdominal pain on and off. The pain is sometimes quite severe and sometimes last for several hours. It sometimes is only 5 or 10 minutes. It does not occur every day. She does have some associated nausea. She has tried taking constant Prilosec and this has not made the pain go away. She has not seemed to notice that the pain is worse with food or relieved with food.  She denies fevers and chills. She denies jaundice. She originally thought this was something to do with her colon cancer but these studies were all clear.  She has had an incisional hernia repair a year after her colon cancer surgery. These were done by Dr. Leone.  She says the only thing that seems to help the pain is rubbing her stomach.  Past Medical History  Diagnosis Date  . Hx of colon cancer, stage II 08/24/2011    T3N0  adenoca cecum resected 04/2004  Xeloda adjuvant chemo  . Hyperlipidemia 08/24/2011  . Anxiety and depression 08/24/2011  . Cancer     colon  . Arthritis     Past Surgical History  Procedure Date  . Breast surgery     reduction  . Knee arthroscopy   . Hernia repair     Current Outpatient Prescriptions  Medication Sig Dispense Refill  . ALPRAZolam (XANAX) 0.25 MG tablet Take 0.25 mg by mouth 2 (two) times daily as needed.      . buPROPion (WELLBUTRIN XL) 150 MG 24 hr tablet Take 150 mg by mouth daily.      . DULoxetine (CYMBALTA) 60 MG capsule Take 60 mg by mouth daily.      . ergocalciferol (VITAMIN D2) 50000 UNITS capsule Take 50,000 Units by mouth once a week.      . Multiple Vitamin (MULTIVITAMIN) tablet Take 1 tablet by mouth daily.      . simvastatin (ZOCOR) 40 MG tablet Take 40 mg by mouth every evening.      . zolpidem (AMBIEN) 10 MG tablet Take 10 mg by mouth at bedtime as needed.         Allergies  Allergen Reactions  .  Dilaudid (Hydromorphone Hcl) Itching  . Morphine And Related Itching  . Sulfa Antibiotics   . Tetracyclines & Related Nausea Only     Family History  Problem Relation Age of Onset  . Diabetes Mother     type 1  . Alcohol abuse Father   . Cancer Paternal Aunt     germ cell  . Cancer Maternal Grandmother     colon  . Cancer Maternal Uncle     throat     History   Social History  . Marital Status: Married    Spouse Name: N/A    Number of Children: N/A  . Years of Education: N/A   Social History Main Topics  . Smoking status: Former Smoker  . Smokeless tobacco: Never Used  . Alcohol Use: Yes     1 cocktail/glass of wine per day  . Drug Use: No  . Sexually Active: None   REVIEW OF SYSTEMS - PERTINENT POSITIVES ONLY: 12 point review of systems negative other than HPI and PMH except for diarrhea and joint pain.    EXAM: Filed Vitals:   09/27/11 1328  BP: 124/72  Pulse: 76    Temp: 99 F (37.2 C)  Resp: 16    Gen:  No acute distress.  Well nourished and well groomed.   Neurological: Alert and oriented to person, place, and time. Coordination normal.  Head: Normocephalic and atraumatic.  Eyes: Conjunctivae are normal. Pupils are equal, round, and reactive to light. No scleral icterus.  Neck: Normal range of motion. Neck supple. No tracheal deviation or thyromegaly present.  Cardiovascular: Normal rate, regular rhythm, normal heart sounds and intact distal pulses.  Exam reveals no gallop and no friction rub.  No murmur heard. Respiratory: Effort normal.  No respiratory distress. No chest wall tenderness. Breath sounds normal.  No wheezes, rales or rhonchi.  GI: Soft. Bowel sounds are normal. The abdomen is soft.  There is RUQ tenderness.  There is no rebound and no guarding.  Musculoskeletal: Normal range of motion. Extremities are nontender.  Lymphadenopathy: No cervical, preauricular, postauricular or axillary adenopathy is present Skin: Skin is warm and dry. No rash  noted. No diaphoresis. No erythema. No pallor. No clubbing, cyanosis, or edema.   Psychiatric: Normal mood and affect. Behavior is normal. Judgment and thought content normal.    LABORATORY RESULTS: Available labs are reviewed  CBC, CMET near normal 7/16.  Creatinine 1.24  RADIOLOGY RESULTS: See E-Chart or I-Site for most recent results.  Images and reports are reviewed. Ultrasound IMPRESSION:  1. 1.8 cm mobile gallstone within the gallbladder. No current  evidence of acute cholecystitis.  2. No ductal dilatation.     ASSESSMENT AND PLAN: Chronic cholecystitis with calculus Pt has RUQ tenderness, gallstones, and epigastric pain. Plan lap chole with cholangiogram.    The surgical procedure was described to the patient in detail.  The patient was given educational material. .  I discussed the incision type and location, the location of the gallbladder, the anatomy of the bile ducts and arteries, and the typical progression of surgery.  I discussed the possibility of converting to an open operation.  I advised of the risks of bleeding, infection, damage to other structures (such as the bile duct, intestine or liver), bile leak, need for other procedures or surgeries, and post op diarrhea/constipation.  We discussed the risk of blood clot.  We discussed the recovery period and post operative restrictions.  The patient was advised against taking blood thinners the week before surgery.     She is planning a cruise at the end of September.  We will try to get this done as soon as possible so that it will not interfere with her plans.        Lavaya Defreitas L Rhia Blatchford MD Surgical Oncology, General and Endocrine Surgery [facility]      Visit Diagnoses: 1. Chronic cholecystitis with calculus     Primary Care Physician: GATES,DONNA RUTH, MD  [place], James    

## 2011-10-05 NOTE — Anesthesia Postprocedure Evaluation (Signed)
  Anesthesia Post-op Note  Patient: Lorraine May  Procedure(s) Performed: Procedure(s) (LRB) with comments: LAPAROSCOPIC CHOLECYSTECTOMY WITH INTRAOPERATIVE CHOLANGIOGRAM (N/A)  Patient Location: PACU  Anesthesia Type: General  Level of Consciousness: awake, alert  and oriented  Airway and Oxygen Therapy: Patient Spontanous Breathing  Post-op Pain: mild  Post-op Assessment: Post-op Vital signs reviewed, Patient's Cardiovascular Status Stable, Respiratory Function Stable, Patent Airway, No signs of Nausea or vomiting and Pain level controlled  Post-op Vital Signs: Reviewed and stable  Complications: No apparent anesthesia complications

## 2011-10-05 NOTE — Transfer of Care (Signed)
Immediate Anesthesia Transfer of Care Note  Patient: Lorraine May  Procedure(s) Performed: Procedure(s) (LRB): LAPAROSCOPIC CHOLECYSTECTOMY WITH INTRAOPERATIVE CHOLANGIOGRAM (N/A)  Patient Location: PACU  Anesthesia Type: General  Level of Consciousness: awake, alert  and oriented  Airway & Oxygen Therapy: Patient Spontanous Breathing and Patient connected to nasal cannula oxygen  Post-op Assessment: Report given to PACU RN, Post -op Vital signs reviewed and stable and Patient moving all extremities X 4  Post vital signs: Reviewed and stable  Complications: No apparent anesthesia complications

## 2011-10-05 NOTE — Op Note (Signed)
NAMEMEIGAN, Lorraine May                 ACCOUNT NO.:  [id number]  MEDICAL RECORD NO.:  [id number]  LOCATION:  [facility]                         FACILITY:  [facility]  PHYSICIAN:  Lorraine Lint, MD       DATE OF BIRTH:  March 11, 1955  DATE OF PROCEDURE:  10/05/2011 DATE OF DISCHARGE:                              OPERATIVE REPORT   PREOPERATIVE DIAGNOSIS:  Chronic cholecystitis and cholelithiasis.  POSTOPERATIVE DIAGNOSIS:  Chronic cholecystitis and cholelithiasis.  PROCEDURE PERFORMED:  Laparoscopic cholecystectomy with intraoperative cholangiogram.  SURGEON:  Lorraine Lint, MD  ASSISTANT:  None.  ANESTHESIA:  General and local.  FINDINGS:  Mild chronic inflammation of gallbladder.  ESTIMATED BLOOD LOSS:  Minimal.  SPECIMEN:  Gallbladder to Pathology.  COMPLICATIONS:  None known.  PROCEDURE IN DETAIL:  Lorraine May was identified in the holding area and taken to the operating room, where she was placed on the operating room table.  General anesthesia was induced.  Her abdomen was prepped and draped in sterile fashion.  Time-out was performed according to the surgical safety check list.  When all was correct, we continued. Because of her prior surgery, a left subcostal Optiview port was placed after administration of local.  She was found to have many adhesions to the abdominal wall.  An additional 5 mm trocar was placed in the left lateral abdomen and some of the upper abdominal adhesions were taken down sharply and bluntly.  Another adhesiolysis was performed to see over into the right upper quadrant.  Two trocars were placed in the right upper quadrant in the standard position.  The camera port was switched over to this location.  Additional adhesions were taken down in the upper abdomen to create the site for the periumbilical port.  A 11 mm port was placed in the subxiphoid position and a 5 mm port was placed in the supraumbilical position.  This allowed for adequate visualization to  perform the cholecystectomy.  The fundus of the gallbladder was retracted superiorly toward the head and the infundibulum was retracted laterally.  Peritoneum was opened up over the cystic duct.  The cystic duct was not dilated.  This was skeletonized as well as the cystic artery.  The critical view was obtained demonstrating only 2 tubular structures entering the gallbladder from the medial and lateral aspect. The peritoneum was opened up all the way to the edge of the liver.  The cystic duct was clipped high up on the specimen side, and the cystic artery was clipped once on the specimen side and twice on the patient's side.  Cystic artery was divided and a ductotomy was made in the cystic duct.  The cholangiogram catheter was advanced to the abdominal wall onto the cystic duct and clipped into  place.  The saline flowed easily. Cholangiogram was then shot demonstrating good filling of the left and right hepatic duct as well as good contrast flow into the duodenum without evidence of meniscal defect.  The cholangiogram catheter was then removed.  The patient was placed back into reverse Trendelenburg position.  The catheter was removed.  The cystic duct was then triply clipped on the patient's side.  This was then divided.  The hook cautery was used to take the gallbladder off the gallbladder fossa.  There were few other small tubular structures directly entering the gallbladder that appeared to be duct of Luschka or smaller vessels and these were divided.  The gallbladder was then retrieved through the subxiphoid position with the EndoCatch bag.  The gallbladder fossa was examined for hemostasis.  There were several areas of oozing, these were cauterized. The remainder of the abdomen was examined as best as possible in the upper quadrants and there was no evidence of bleeding.  The pneumoperitoneum was allowed to evacuate as the trocars were removed. The skin of all the incisions was  closed with 4-0 Monocryl in subcuticular fashion.  The wounds were cleaned, dried, and dressed with Dermabond.  The patient was awakened from anesthesia and taken to PACU in stable condition.  Needle, sponge, and instrument counts were correct.     Lorraine Lint, MD     FB/MEDQ  D:  10/05/2011  T:  10/05/2011  Job:  [id number]

## 2011-10-05 NOTE — Brief Op Note (Signed)
10/05/2011  9:00 AM  PATIENT:  Lorraine May  56 y.o. female  PRE-OPERATIVE DIAGNOSIS:  Chronic cholecystitis and cholelithiasis.  POST-OPERATIVE DIAGNOSIS:  Chronic cholecystitis and cholelithiasis.  PROCEDURE:  Procedure(s) (LRB): LAPAROSCOPIC CHOLECYSTECTOMY WITH INTRAOPERATIVE CHOLANGIOGRAM (N/A)  SURGEON:  Surgeon(s) and Role:    * Almond Lint, MD - Primary  PHYSICIAN ASSISTANT:   ASSISTANTS: none   ANESTHESIA:   local and general  EBL:   minimal  LOCAL MEDICATIONS USED:  BUPIVICAINE  and LIDOCAINE   SPECIMEN:  Source of Specimen:  gallbladder  DISPOSITION OF SPECIMEN:  PATHOLOGY  COUNTS:  YES  DICTATION: .Other Dictation: Dictation Number [id number]  PLAN OF CARE: Discharge to home after PACU  PATIENT DISPOSITION:  PACU - hemodynamically stable.   Delay start of Pharmacological VTE agent (>24hrs) due to surgical blood loss or risk of bleeding: not applicable

## 2011-10-05 NOTE — Preoperative (Signed)
Beta Blockers   Reason not to administer Beta Blockers:Not Applicable 

## 2011-10-05 NOTE — Anesthesia Preprocedure Evaluation (Signed)
Anesthesia Evaluation  Patient identified by MRN, date of birth, ID band Patient awake    Reviewed: Allergy & Precautions, H&P , NPO status , Patient's Chart, lab work & pertinent test results  History of Anesthesia Complications Negative for: history of anesthetic complications  Airway Mallampati: III TM Distance: <3 FB Neck ROM: Full  Mouth opening: Limited Mouth Opening  Dental No notable dental hx. (+) Teeth Intact and Dental Advisory Given   Pulmonary neg pulmonary ROS,  breath sounds clear to auscultation  Pulmonary exam normal       Cardiovascular negative cardio ROS  Rhythm:Regular Rate:Normal     Neuro/Psych negative neurological ROS     GI/Hepatic Neg liver ROS, H/o colon cancer: surgery and chemo   Endo/Other  negative endocrine ROS  Renal/GU negative Renal ROS     Musculoskeletal   Abdominal   Peds  Hematology negative hematology ROS (+)   Anesthesia Other Findings   Reproductive/Obstetrics                           Anesthesia Physical Anesthesia Plan  ASA: II  Anesthesia Plan: General   Post-op Pain Management:    Induction: Intravenous  Airway Management Planned: Oral ETT and Video Laryngoscope Planned  Additional Equipment:   Intra-op Plan:   Post-operative Plan: Extubation in OR  Informed Consent: I have reviewed the patients History and Physical, chart, labs and discussed the procedure including the risks, benefits and alternatives for the proposed anesthesia with the patient or authorized representative who has indicated his/her understanding and acceptance.   Dental advisory given  Plan Discussed with: CRNA and Surgeon  Anesthesia Plan Comments: (plan routine monitors, GETA)        Anesthesia Quick Evaluation

## 2011-10-05 NOTE — Progress Notes (Signed)
Discharged home; incisions stable; no further nausea/vomiting after IV Zofran

## 2011-10-05 NOTE — Interval H&P Note (Signed)
History and Physical Interval Note:  10/05/2011 7:24 AM  Lorraine May  has presented today for surgery, with the diagnosis of Chronic cholecystitis and cholelithiasis.  The various methods of treatment have been discussed with the patient and family. After consideration of risks, benefits and other options for treatment, the patient has consented to  Procedure(s) (LRB): LAPAROSCOPIC CHOLECYSTECTOMY WITH INTRAOPERATIVE CHOLANGIOGRAM (N/A) as a surgical intervention .  The patient's history has been reviewed, patient examined, no change in status, stable for surgery.  I have reviewed the patient's chart and labs.  Questions were answered to the patient's satisfaction.     Annalie Wenner

## 2011-10-06 ENCOUNTER — Encounter (HOSPITAL_COMMUNITY): Payer: Self-pay | Admitting: General Surgery

## 2011-10-18 ENCOUNTER — Encounter (INDEPENDENT_AMBULATORY_CARE_PROVIDER_SITE_OTHER): Payer: Self-pay | Admitting: General Surgery

## 2011-10-18 ENCOUNTER — Ambulatory Visit (INDEPENDENT_AMBULATORY_CARE_PROVIDER_SITE_OTHER): Payer: BC Managed Care – PPO | Admitting: General Surgery

## 2011-10-18 VITALS — BP 108/72 | HR 45 | Temp 97.8°F | Resp 20 | Ht 65.0 in | Wt 161.2 lb

## 2011-10-18 DIAGNOSIS — K8010 Calculus of gallbladder with chronic cholecystitis without obstruction: Secondary | ICD-10-CM

## 2011-10-18 NOTE — Assessment & Plan Note (Addendum)
Follow up as needed.  Doing well post operatively.

## 2011-10-18 NOTE — Progress Notes (Signed)
HISTORY: Pt doing well post operatively.  She took tylenol for pain after the percocet caused itching.  She is otherwise feeling well.  She has a slow improvement in her appetite and energy level.  She had significant adhesions in her upper abdomen and required several other incisions for LOA.    EXAM: General:  Alert and oriented Incision:  Healing well.     PATHOLOGY: Chronic cholecystitis and cholelithiasis.    ASSESSMENT AND PLAN:   Chronic cholecystitis with calculus Follow up as needed.  Doing well post operatively.     Maudry Diego, MD Surgical Oncology, General & Endocrine Surgery [facility]  Hollice Espy, MD Hollice Espy, MD

## 2011-10-18 NOTE — Patient Instructions (Addendum)
Follow up as needed.  Increase diet and activity as tolerated.

## 2011-12-01 ENCOUNTER — Other Ambulatory Visit: Payer: Self-pay | Admitting: Obstetrics & Gynecology

## 2011-12-01 DIAGNOSIS — Z1231 Encounter for screening mammogram for malignant neoplasm of breast: Secondary | ICD-10-CM

## 2011-12-09 ENCOUNTER — Ambulatory Visit
Admission: RE | Admit: 2011-12-09 | Discharge: 2011-12-09 | Disposition: A | Discharge status: Home / Self Care | Payer: BC Managed Care – PPO | Source: Ambulatory Visit | Attending: Obstetrics & Gynecology | Admitting: Obstetrics & Gynecology

## 2011-12-09 DIAGNOSIS — Z1231 Encounter for screening mammogram for malignant neoplasm of breast: Secondary | ICD-10-CM

## 2012-08-15 ENCOUNTER — Other Ambulatory Visit (HOSPITAL_BASED_OUTPATIENT_CLINIC_OR_DEPARTMENT_OTHER): Payer: BC Managed Care – PPO | Admitting: Lab

## 2012-08-15 DIAGNOSIS — Z85038 Personal history of other malignant neoplasm of large intestine: Secondary | ICD-10-CM

## 2012-08-15 LAB — CBC WITH DIFFERENTIAL/PLATELET
BASO%: 1.3 % (ref 0.0–2.0)
Basophils Absolute: 0.1 10*3/uL (ref 0.0–0.1)
EOS%: 1 % (ref 0.0–7.0)
Eosinophils Absolute: 0 10*3/uL (ref 0.0–0.5)
HCT: 39.2 % (ref 34.8–46.6)
HGB: 13.3 g/dL (ref 11.6–15.9)
LYMPH%: 37.5 % (ref 14.0–49.7)
MCH: 31.5 pg (ref 25.1–34.0)
MCHC: 33.8 g/dL (ref 31.5–36.0)
MCV: 93.2 fL (ref 79.5–101.0)
MONO#: 0.4 10*3/uL (ref 0.1–0.9)
MONO%: 7.9 % (ref 0.0–14.0)
NEUT#: 2.4 10*3/uL (ref 1.5–6.5)
NEUT%: 52.3 % (ref 38.4–76.8)
Platelets: 223 10*3/uL (ref 145–400)
RBC: 4.21 10*6/uL (ref 3.70–5.45)
RDW: 13.3 % (ref 11.2–14.5)
WBC: 4.5 10*3/uL (ref 3.9–10.3)
lymph#: 1.7 10*3/uL (ref 0.9–3.3)

## 2012-08-15 LAB — COMPREHENSIVE METABOLIC PANEL (CC13)
ALT: 15 U/L (ref 0–55)
AST: 17 U/L (ref 5–34)
Albumin: 4 g/dL (ref 3.5–5.0)
Alkaline Phosphatase: 80 U/L (ref 40–150)
BUN: 17 mg/dL (ref 7.0–26.0)
CO2: 24 mEq/L (ref 22–29)
Calcium: 9.6 mg/dL (ref 8.4–10.4)
Chloride: 107 mEq/L (ref 98–109)
Creatinine: 1.3 mg/dL — ABNORMAL HIGH (ref 0.6–1.1)
Glucose: 112 mg/dl (ref 70–140)
Potassium: 4.3 mEq/L (ref 3.5–5.1)
Sodium: 142 mEq/L (ref 136–145)
Total Bilirubin: 0.33 mg/dL (ref 0.20–1.20)
Total Protein: 7.2 g/dL (ref 6.4–8.3)

## 2012-08-15 LAB — LACTATE DEHYDROGENASE (CC13): LDH: 171 U/L (ref 125–245)

## 2012-08-16 LAB — CEA: CEA: 0.8 ng/mL (ref 0.0–5.0)

## 2012-08-22 ENCOUNTER — Ambulatory Visit (HOSPITAL_BASED_OUTPATIENT_CLINIC_OR_DEPARTMENT_OTHER): Payer: BC Managed Care – PPO | Admitting: Oncology

## 2012-08-22 VITALS — BP 127/79 | HR 64 | Temp 97.1°F | Resp 18 | Ht 65.0 in | Wt 173.3 lb

## 2012-08-22 DIAGNOSIS — Z85038 Personal history of other malignant neoplasm of large intestine: Secondary | ICD-10-CM

## 2012-08-24 ENCOUNTER — Telehealth: Payer: Self-pay | Admitting: Oncology

## 2012-08-24 NOTE — Telephone Encounter (Signed)
lvm for pt regarding to 7.21.15 appt....mailed pt avs

## 2012-08-25 NOTE — Progress Notes (Signed)
Hematology and Oncology Follow Up Visit  Lorraine May [id number] Jan 01, 1956 57 y.o. 08/25/2012 7:26 PM   Principle Diagnosis: Encounter Diagnosis  Name Primary?  Marland Kitchen Hx of colon cancer, stage II Yes     Interim History:  Followup visit for this 57 year old woman diagnosed with a stage II, grade 2, T3 N0, 3.7 cm adenocarcinoma of the cecum in March 2006. No vascular or lymphatic invasion. 17 nodes negative. She underwent a right hemicolectomy on 04/20/04. Preop CEA was 5.7. She was put on adjuvant chemotherapy initially with bolus 5-FU, leucovorin, and oxaliplatin. She was unable to tolerate the chemotherapy. Her regimen was then modified to single agent oral Xeloda. She took this through 01/10/2005.  She was evaluated in our genetics clinic and testing was done on her tumor sample for MLH1, MSH 2, and MSH 6 genes and no large deletions were detected.  Her most recent colonoscopy was done in 06/08/2010 and was negative except for a small hyperplastic polyp which was removed.  She is doing well. She's had no interim medical problems. She still has irritable bowel symptoms with frequent, spontaneous, loose bowel movements which is impacting on her quality of life. She is taking Imodium on a as needed basis. No abdominal pain or cramps. No hematochezia or melena.    Medications: reviewed  Allergies:  Allergies  Allergen Reactions  . Dilaudid (Hydromorphone Hcl) Itching  . Fentanyl Itching  . Morphine And Related Itching  . Tetracyclines & Related Nausea Only  . Sulfa Antibiotics Rash    Review of Systems: Constitutional: No constitutional symptoms   Respiratory: No cough or dyspnea Cardiovascular:  No chest pain or palpitations Gastrointestinal: See above Genito-Urinary: No urinary tract symptoms Musculoskeletal: No muscle bone or joint pain Neurologic: No headache or change in vision Skin: No rash or ecchymosis Remaining ROS negative.  Physical Exam: Blood pressure 127/79, pulse  64, temperature 97.1 F (36.2 C), temperature source Oral, resp. rate 18, height 5\' 5"  (1.651 m), weight 173 lb 4.8 oz (78.608 kg). Wt Readings from Last 3 Encounters:  08/22/12 173 lb 4.8 oz (78.608 kg)  10/18/11 161 lb 3.2 oz (73.12 kg)  09/30/11 166 lb 1.6 oz (75.342 kg)     General appearance: Well-nourished Caucasian woman HENNT: Pharynx no erythema or exudate Lymph nodes: No lymphadenopathy Breasts: Lungs: Clear to auscultation resonant to percussion Heart: Regular rhythm no murmur Abdomen: Soft, nontender, no mass, no organomegaly Extremities: No edema, no calf tenderness Musculoskeletal: No joint deformities GU: Vascular: No carotid bruits, and no cyanosis Neurologic: Motor strength 5 over 5, reflexes 1+ symmetric Skin: No rash or ecchymosis  Lab Results: Lab Results  Component Value Date   WBC 4.5 08/15/2012   HGB 13.3 08/15/2012   HCT 39.2 08/15/2012   MCV 93.2 08/15/2012   PLT 223 08/15/2012     Chemistry      Component Value Date/Time   NA 142 08/15/2012 1412   NA 141 09/30/2011 1441   K 4.3 08/15/2012 1412   K 3.9 09/30/2011 1441   CL 104 09/30/2011 1441   CO2 24 08/15/2012 1412   CO2 27 09/30/2011 1441   BUN 17.0 08/15/2012 1412   BUN 18 09/30/2011 1441   CREATININE 1.3* 08/15/2012 1412   CREATININE 1.15* 09/30/2011 1441      Component Value Date/Time   CALCIUM 9.6 08/15/2012 1412   CALCIUM 9.3 09/30/2011 1441   ALKPHOS 80 08/15/2012 1412   ALKPHOS 66 09/30/2011 1441   AST 17 08/15/2012 1412  AST 19 09/30/2011 1441   ALT 15 08/15/2012 1412   ALT 11 09/30/2011 1441   BILITOT 0.33 08/15/2012 1412   BILITOT 0.2* 09/30/2011 1441       Radiological Studies: No results found.  Impression: #1. Stage II, T3 N0, adenocarcinoma of the cecum treated as outlined above. She remains free of any obvious recurrence now out over 8 years from diagnosis.  I will continue  annual followup visits through year 10 and then graduate her from our practice.  #2. Hyperlipidemia.  #3.  Chronic anxiety and depression #4. Dumping syndrome   CC:. Dr. Shaune Pollack; Dr. Wandalee Ferdinand; Dr. Abbey Chatters   Levert Feinstein, MD 7/25/20147:26 PM

## 2012-09-18 ENCOUNTER — Other Ambulatory Visit: Payer: Self-pay | Admitting: Dermatology

## 2012-09-18 DIAGNOSIS — C4491 Basal cell carcinoma of skin, unspecified: Secondary | ICD-10-CM

## 2012-09-18 HISTORY — DX: Basal cell carcinoma of skin, unspecified: C44.91

## 2012-11-15 ENCOUNTER — Other Ambulatory Visit: Payer: Self-pay

## 2012-11-15 DIAGNOSIS — Z1231 Encounter for screening mammogram for malignant neoplasm of breast: Secondary | ICD-10-CM

## 2012-12-07 ENCOUNTER — Other Ambulatory Visit: Payer: Self-pay

## 2012-12-13 ENCOUNTER — Ambulatory Visit
Admission: RE | Admit: 2012-12-13 | Discharge: 2012-12-13 | Disposition: A | Discharge status: Home / Self Care | Payer: BC Managed Care – PPO | Source: Ambulatory Visit

## 2012-12-13 DIAGNOSIS — Z1231 Encounter for screening mammogram for malignant neoplasm of breast: Secondary | ICD-10-CM

## 2013-03-31 ENCOUNTER — Encounter: Payer: Self-pay | Admitting: *Deleted

## 2013-03-31 ENCOUNTER — Telehealth: Payer: Self-pay | Admitting: *Deleted

## 2013-03-31 NOTE — Telephone Encounter (Signed)
Former pt of DR. G...td  

## 2013-06-19 ENCOUNTER — Other Ambulatory Visit: Payer: Self-pay | Admitting: Gastroenterology

## 2013-08-09 ENCOUNTER — Other Ambulatory Visit: Payer: Self-pay | Admitting: Family Medicine

## 2013-08-09 ENCOUNTER — Ambulatory Visit
Admission: RE | Admit: 2013-08-09 | Discharge: 2013-08-09 | Disposition: A | Discharge status: Home / Self Care | Payer: BC Managed Care – PPO | Source: Ambulatory Visit | Attending: Family Medicine | Admitting: Family Medicine

## 2013-08-09 DIAGNOSIS — W19XXXA Unspecified fall, initial encounter: Secondary | ICD-10-CM

## 2013-08-21 ENCOUNTER — Ambulatory Visit (HOSPITAL_BASED_OUTPATIENT_CLINIC_OR_DEPARTMENT_OTHER): Payer: BC Managed Care – PPO | Admitting: Oncology

## 2013-08-21 ENCOUNTER — Other Ambulatory Visit (HOSPITAL_BASED_OUTPATIENT_CLINIC_OR_DEPARTMENT_OTHER): Payer: BC Managed Care – PPO

## 2013-08-21 ENCOUNTER — Encounter: Payer: Self-pay | Admitting: Oncology

## 2013-08-21 ENCOUNTER — Ambulatory Visit: Payer: BC Managed Care – PPO | Admitting: Oncology

## 2013-08-21 ENCOUNTER — Other Ambulatory Visit: Payer: BC Managed Care – PPO

## 2013-08-21 VITALS — BP 119/70 | HR 63 | Temp 98.2°F | Resp 18 | Ht 65.0 in | Wt 160.4 lb

## 2013-08-21 DIAGNOSIS — Z85038 Personal history of other malignant neoplasm of large intestine: Secondary | ICD-10-CM

## 2013-08-21 LAB — LACTATE DEHYDROGENASE (CC13): LDH: 162 U/L (ref 125–245)

## 2013-08-21 LAB — CBC WITH DIFFERENTIAL/PLATELET
BASO%: 1 % (ref 0.0–2.0)
Basophils Absolute: 0 10*3/uL (ref 0.0–0.1)
EOS%: 2.4 % (ref 0.0–7.0)
Eosinophils Absolute: 0.1 10*3/uL (ref 0.0–0.5)
HCT: 37.2 % (ref 34.8–46.6)
HGB: 12.3 g/dL (ref 11.6–15.9)
LYMPH%: 37 % (ref 14.0–49.7)
MCH: 30.7 pg (ref 25.1–34.0)
MCHC: 33 g/dL (ref 31.5–36.0)
MCV: 93.2 fL (ref 79.5–101.0)
MONO#: 0.3 10*3/uL (ref 0.1–0.9)
MONO%: 7.7 % (ref 0.0–14.0)
NEUT#: 2.1 10*3/uL (ref 1.5–6.5)
NEUT%: 51.9 % (ref 38.4–76.8)
Platelets: 226 10*3/uL (ref 145–400)
RBC: 3.99 10*6/uL (ref 3.70–5.45)
RDW: 13.8 % (ref 11.2–14.5)
WBC: 4.1 10*3/uL (ref 3.9–10.3)
lymph#: 1.5 10*3/uL (ref 0.9–3.3)

## 2013-08-21 LAB — COMPREHENSIVE METABOLIC PANEL (CC13)
ALT: 11 U/L (ref 0–55)
AST: 12 U/L (ref 5–34)
Albumin: 3.7 g/dL (ref 3.5–5.0)
Alkaline Phosphatase: 56 U/L (ref 40–150)
Anion Gap: 8 mEq/L (ref 3–11)
BUN: 18.7 mg/dL (ref 7.0–26.0)
CO2: 26 mEq/L (ref 22–29)
Calcium: 9.1 mg/dL (ref 8.4–10.4)
Chloride: 108 mEq/L (ref 98–109)
Creatinine: 1.1 mg/dL (ref 0.6–1.1)
Glucose: 91 mg/dl (ref 70–140)
Potassium: 4.1 mEq/L (ref 3.5–5.1)
Sodium: 142 mEq/L (ref 136–145)
Total Bilirubin: 0.27 mg/dL (ref 0.20–1.20)
Total Protein: 6.5 g/dL (ref 6.4–8.3)

## 2013-08-21 NOTE — Progress Notes (Signed)
Hematology and Oncology Follow Up Visit  Lorraine May [id number] Jul 28, 1955 57 y.o. 08/21/2013 3:26 PM   Principle Diagnosis: 58 year old woman diagnosed with a stage II, grade 2, T3 N0, 3.7 cm adenocarcinoma of the cecum in March 2006. No vascular or lymphatic invasion. 17 nodes negative.   Past therapy: She underwent a right hemicolectomy on 04/20/04. Preop CEA was 5.7. She was put on adjuvant chemotherapy initially with bolus 5-FU, leucovorin, and oxaliplatin. She was unable to tolerate the chemotherapy. Her regimen was then modified to single agent oral Xeloda. She took this through 01/10/2005.   Current therapy: Observation and follow up.   Interim History:  Lorraine May presents today for a followup visit. She is a pleasant woman is to be followed by Dr. Beryle Beams for colon cancer. At her last visit she has been doing very well. She had a fall recently and sustained a rib fracture. Her most recent colonoscopy was done in 06/2013 and was negative except for a small hyperplastic polyp which was removed.  She is doing well. She's had no interim medical problems. She still has irritable bowel symptoms with frequent, spontaneous, loose bowel movements which is impacting on her quality of life. No abdominal pain or cramps. No hematochezia or melena.    Medications: reviewed  Allergies:  Allergies  Allergen Reactions  . Dilaudid [Hydromorphone Hcl] Itching  . Fentanyl Itching  . Morphine And Related Itching  . Percocet [Oxycodone-Acetaminophen]     itching  . Tetracyclines & Related Nausea Only  . Sulfa Antibiotics Rash    Review of Systems: Constitutional: No fevers or chills or sweats. Respiratory: No cough or dyspnea no shortness of breath Cardiovascular:  No chest pain or palpitations no leg edema Genito-Urinary: No urinary tract symptoms Musculoskeletal: No muscle bone or joint pain Neurologic: No headache or change in vision no syncope or seizures Skin: No rash or  ecchymosis Remaining ROS negative.  Physical Exam: Blood pressure 119/70, pulse 63, temperature 98.2 F (36.8 C), temperature source Oral, resp. rate 18, height 5\' 5"  (1.651 m), weight 160 lb 6.4 oz (72.757 kg), SpO2 99.00%. Wt Readings from Last 3 Encounters:  08/21/13 160 lb 6.4 oz (72.757 kg)  08/22/12 173 lb 4.8 oz (78.608 kg)  10/18/11 161 lb 3.2 oz (73.12 kg)     General appearance: Well-nourished Caucasian woman not in any distress. HENNT: Pharynx no erythema or exudate Lymph nodes: No lymphadenopathy Lungs: Clear to auscultation resonant to percussion Heart: Regular rhythm no murmur Abdomen: Soft, nontender, no mass, no organomegaly good bowel sounds. Extremities: No edema, no calf tenderness Musculoskeletal: No joint deformities Neurologic: Motor strength 5 over 5, reflexes 1+ symmetric Skin: No rash or ecchymosis  Lab Results: Lab Results  Component Value Date   WBC 4.1 08/21/2013   HGB 12.3 08/21/2013   HCT 37.2 08/21/2013   MCV 93.2 08/21/2013   PLT 226 08/21/2013     Chemistry      Component Value Date/Time   NA 142 08/15/2012 1412   NA 141 09/30/2011 1441   K 4.3 08/15/2012 1412   K 3.9 09/30/2011 1441   CL 104 09/30/2011 1441   CO2 24 08/15/2012 1412   CO2 27 09/30/2011 1441   BUN 17.0 08/15/2012 1412   BUN 18 09/30/2011 1441   CREATININE 1.3* 08/15/2012 1412   CREATININE 1.15* 09/30/2011 1441      Component Value Date/Time   CALCIUM 9.6 08/15/2012 1412   CALCIUM 9.3 09/30/2011 1441   ALKPHOS 80 08/15/2012 1412  ALKPHOS 66 09/30/2011 1441   AST 17 08/15/2012 1412   AST 19 09/30/2011 1441   ALT 15 08/15/2012 1412   ALT 11 09/30/2011 1441   BILITOT 0.33 08/15/2012 1412   BILITOT 0.2* 09/30/2011 1441        Impression:  1. Stage II, T3 N0, adenocarcinoma of the cecum treated as outlined above. She remains free of any obvious recurrence now out over 9 years from diagnosis. I see no further need for oncology followup and will be happy to see her in the future as  needed.  2. Colonoscopy surveillance: She is up to date with her screening last one done in May of 2015.    [facility], MD 7/21/20153:26 PM

## 2013-08-22 LAB — CEA: CEA: 1.4 ng/mL (ref 0.0–5.0)

## 2013-12-25 ENCOUNTER — Other Ambulatory Visit: Payer: Self-pay

## 2013-12-25 DIAGNOSIS — Z1231 Encounter for screening mammogram for malignant neoplasm of breast: Secondary | ICD-10-CM

## 2013-12-25 DIAGNOSIS — Z9889 Other specified postprocedural states: Secondary | ICD-10-CM

## 2014-01-03 ENCOUNTER — Ambulatory Visit
Admission: RE | Admit: 2014-01-03 | Discharge: 2014-01-03 | Disposition: A | Discharge status: Home / Self Care | Payer: BC Managed Care – PPO | Source: Ambulatory Visit

## 2014-01-03 DIAGNOSIS — Z9889 Other specified postprocedural states: Secondary | ICD-10-CM

## 2014-01-03 DIAGNOSIS — Z1231 Encounter for screening mammogram for malignant neoplasm of breast: Secondary | ICD-10-CM

## 2014-02-01 HISTORY — PX: OTHER SURGICAL HISTORY: SHX169

## 2014-02-07 ENCOUNTER — Emergency Department (HOSPITAL_COMMUNITY)
Admission: EM | Admit: 2014-02-07 | Discharge: 2014-02-08 | Disposition: A | Discharge status: Other Acute Inpatient Hospital | Payer: BLUE CROSS/BLUE SHIELD | Attending: Emergency Medicine | Admitting: Emergency Medicine

## 2014-02-07 ENCOUNTER — Encounter (HOSPITAL_COMMUNITY): Payer: Self-pay | Admitting: Cardiology

## 2014-02-07 DIAGNOSIS — Z79899 Other long term (current) drug therapy: Secondary | ICD-10-CM | POA: Diagnosis not present

## 2014-02-07 DIAGNOSIS — E785 Hyperlipidemia, unspecified: Secondary | ICD-10-CM | POA: Insufficient documentation

## 2014-02-07 DIAGNOSIS — Z85038 Personal history of other malignant neoplasm of large intestine: Secondary | ICD-10-CM | POA: Diagnosis not present

## 2014-02-07 DIAGNOSIS — Z8739 Personal history of other diseases of the musculoskeletal system and connective tissue: Secondary | ICD-10-CM | POA: Insufficient documentation

## 2014-02-07 DIAGNOSIS — R45851 Suicidal ideations: Secondary | ICD-10-CM

## 2014-02-07 DIAGNOSIS — F329 Major depressive disorder, single episode, unspecified: Secondary | ICD-10-CM

## 2014-02-07 DIAGNOSIS — F332 Major depressive disorder, recurrent severe without psychotic features: Secondary | ICD-10-CM | POA: Diagnosis not present

## 2014-02-07 DIAGNOSIS — F32A Depression, unspecified: Secondary | ICD-10-CM

## 2014-02-07 LAB — COMPREHENSIVE METABOLIC PANEL
ALT: 14 U/L (ref 0–35)
AST: 21 U/L (ref 0–37)
Albumin: 4.7 g/dL (ref 3.5–5.2)
Alkaline Phosphatase: 64 U/L (ref 39–117)
Anion gap: 12 (ref 5–15)
BUN: 9 mg/dL (ref 6–23)
CO2: 22 mmol/L (ref 19–32)
Calcium: 9.7 mg/dL (ref 8.4–10.5)
Chloride: 105 mEq/L (ref 96–112)
Creatinine, Ser: 1.17 mg/dL — ABNORMAL HIGH (ref 0.50–1.10)
GFR calc Af Amer: 57 mL/min — ABNORMAL LOW (ref >90)
GFR calc non Af Amer: 50 mL/min — ABNORMAL LOW (ref >90)
Glucose, Bld: 120 mg/dL — ABNORMAL HIGH (ref 70–99)
Potassium: 4 mmol/L (ref 3.5–5.1)
Sodium: 139 mmol/L (ref 135–145)
Total Bilirubin: 0.6 mg/dL (ref 0.3–1.2)
Total Protein: 7.3 g/dL (ref 6.0–8.3)

## 2014-02-07 LAB — URINALYSIS, ROUTINE W REFLEX MICROSCOPIC
Bilirubin Urine: NEGATIVE
Glucose, UA: NEGATIVE mg/dL
Ketones, ur: NEGATIVE mg/dL
Nitrite: POSITIVE — AB
Protein, ur: NEGATIVE mg/dL
Specific Gravity, Urine: 1.02 (ref 1.005–1.030)
Urobilinogen, UA: 0.2 mg/dL (ref 0.0–1.0)
pH: 5 (ref 5.0–8.0)

## 2014-02-07 LAB — CBC
HCT: 38.7 % (ref 36.0–46.0)
Hemoglobin: 12.9 g/dL (ref 12.0–15.0)
MCH: 30.1 pg (ref 26.0–34.0)
MCHC: 33.3 g/dL (ref 30.0–36.0)
MCV: 90.4 fL (ref 78.0–100.0)
Platelets: 253 10*3/uL (ref 150–400)
RBC: 4.28 MIL/uL (ref 3.87–5.11)
RDW: 13.2 % (ref 11.5–15.5)
WBC: 6.3 10*3/uL (ref 4.0–10.5)

## 2014-02-07 LAB — URINE MICROSCOPIC-ADD ON

## 2014-02-07 LAB — RAPID URINE DRUG SCREEN, HOSP PERFORMED
Amphetamines: NOT DETECTED
Barbiturates: NOT DETECTED
Benzodiazepines: POSITIVE — AB
Cocaine: NOT DETECTED
Opiates: NOT DETECTED
Tetrahydrocannabinol: NOT DETECTED

## 2014-02-07 LAB — SALICYLATE LEVEL: Salicylate Lvl: <4 mg/dL (ref 2.8–20.0)

## 2014-02-07 LAB — ACETAMINOPHEN LEVEL: Acetaminophen (Tylenol), Serum: <10 ug/mL — ABNORMAL LOW (ref 10–30)

## 2014-02-07 LAB — ETHANOL: Alcohol, Ethyl (B): <5 mg/dL (ref 0–9)

## 2014-02-07 MED ORDER — IBUPROFEN 400 MG PO TABS: 600.0000 mg | ORAL_TABLET | Freq: Three times a day (TID) | ORAL | Status: DC | PRN

## 2014-02-07 MED ORDER — ZOLPIDEM TARTRATE 5 MG PO TABS
5.0000 mg | ORAL_TABLET | Freq: Every evening | ORAL | Status: DC | PRN
  Administered 2014-02-07: 5 mg via ORAL
  Filled 2014-02-07: qty 1

## 2014-02-07 MED ORDER — DICYCLOMINE HCL 20 MG PO TABS
20.0000 mg | ORAL_TABLET | Freq: Three times a day (TID) | ORAL | Status: DC
  Administered 2014-02-07 – 2014-02-08 (×5): 20 mg via ORAL
  Filled 2014-02-07 (×5): qty 1

## 2014-02-07 MED ORDER — ALUM & MAG HYDROXIDE-SIMETH 200-200-20 MG/5ML PO SUSP: 30.0000 mL | ORAL | Status: DC | PRN

## 2014-02-07 MED ORDER — LORAZEPAM 1 MG PO TABS: 1.0000 mg | ORAL_TABLET | Freq: Three times a day (TID) | ORAL | Status: DC | PRN

## 2014-02-07 MED ORDER — LAMOTRIGINE 100 MG PO TABS
200.0000 mg | ORAL_TABLET | Freq: Every day | ORAL | Status: DC
  Administered 2014-02-07 – 2014-02-08 (×2): 200 mg via ORAL
  Filled 2014-02-07 (×2): qty 2

## 2014-02-07 MED ORDER — SIMVASTATIN 40 MG PO TABS
40.0000 mg | ORAL_TABLET | Freq: Every evening | ORAL | Status: DC
  Administered 2014-02-07: 40 mg via ORAL
  Filled 2014-02-07 (×2): qty 1

## 2014-02-07 MED ORDER — ONDANSETRON HCL 4 MG PO TABS: 4.0000 mg | ORAL_TABLET | Freq: Three times a day (TID) | ORAL | Status: DC | PRN

## 2014-02-07 MED ORDER — ACETAMINOPHEN 325 MG PO TABS: 650.0000 mg | ORAL_TABLET | ORAL | Status: DC | PRN

## 2014-02-07 NOTE — ED Notes (Signed)
Dinner order [employer] at bedside

## 2014-02-07 NOTE — ED Notes (Signed)
Pt husband phone number  [phone] rob voggi

## 2014-02-07 NOTE — ED Notes (Signed)
TTS completed.  Pt has warm meal at bedside.

## 2014-02-07 NOTE — ED Provider Notes (Signed)
19:15- reevaluation for status check.  Patient's nurse reported to me that she was no longer suicidal, and wants to go home.  Patient states that she feels better after crying today, is no longer suicidal.  She took her last Geodon pill, 4 days ago.  She saw her therapist, 3 days ago.  She has problems dealing with the death of her mother.  The patient understands that she had a serious issue earlier today, and that she needs to see a psychiatrist prior to leaving the emergency department.  She agrees to stay until the morning when that can be accomplished.  Richarda Blade, MD 02/07/14 [phone]
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
## 2014-02-07 NOTE — ED Notes (Signed)
Lorraine Foster, MD notified re: pt request for up date on care plan, [facility] to complete tele re eval, pt & family updated

## 2014-02-07 NOTE — ED Notes (Signed)
Pts husband called to speak with the pt, husband reports receiving information pamphlet for [facility] guidelines, husband aware of visiting hours

## 2014-02-07 NOTE — BH Assessment (Signed)
Jaira informed TTS Erasmo Downer of the consult.

## 2014-02-07 NOTE — ED Notes (Signed)
Urinalysis added on. Lab called

## 2014-02-07 NOTE — ED Notes (Signed)
Called for dinner tray 

## 2014-02-07 NOTE — BH Assessment (Signed)
[place] Assessment Progress Note    Called EDP Pollina and gathered clinical information on the pt for tele assessment @ 1155 and pt's tele assessment scheduled with pt's nurse.  Shaune Pascal, MS, [facility] Licensed Professional Counselor Therapeutic Triage Specialist [facility] Phone: [phone] Fax: [phone]

## 2014-02-07 NOTE — ED Provider Notes (Signed)
CSN: [id number]     Arrival date & time 02/07/14  1050 History   First MD Initiated Contact with Patient 02/07/14 1111     Chief Complaint  Patient presents with  . Suicidal     (Consider location/radiation/quality/duration/timing/severity/associated sxs/prior Treatment) HPI Comments: Patient presents to the ER for evaluation of depression. Patient reports a long history of depression and mood disorder. She has been on multiple medications in the past without improvement. She has had some recent medication changes, has had Geodon remove her regimen because of side effects. She is currently on Lamictal and Xanax.  She has had further decline recently. Her mother died on Christmas Eve. She has been feeling suicidal. She admits to researching ways to commit suicide last night online. She has had previous overdose attempts.   Past Medical History  Diagnosis Date  . Hx of colon cancer, stage II 08/24/2011    T3N0  adenoca cecum resected 04/2004  Xeloda adjuvant chemo  . Hyperlipidemia 08/24/2011  . Cancer     colon  . Arthritis   . Anxiety and depression 08/24/2011  . Anxiety   . Depression   . Joint pain   . PONV (postoperative nausea and vomiting)   . Nausea & vomiting    Past Surgical History  Procedure Laterality Date  . Knee arthroscopy    . Hernia repair    . Port-a-cath removal      insertion and removal  . Hemicolectomy    . Abdominal hysterectomy    . Ectopic pregnancy surgery    . Breast surgery      reduction  . Cholecystectomy  10/05/2011    Procedure: LAPAROSCOPIC CHOLECYSTECTOMY WITH INTRAOPERATIVE CHOLANGIOGRAM;  Surgeon: Stark Klein, MD;  Location: [facility] OR;  Service: General;  Laterality: N/A;   Family History  Problem Relation Age of Onset  . Diabetes Mother     type 1  . Alcohol abuse Father   . Cancer Paternal Aunt     germ cell  . Cancer Maternal Grandmother     colon  . Cancer Maternal Uncle     throat   History  Substance Use Topics  . Smoking  status: Never Smoker   . Smokeless tobacco: Never Used  . Alcohol Use: Yes     Comment: 1 cocktail/glass of wine per day   OB History    No data available     Review of Systems  Psychiatric/Behavioral: Positive for suicidal ideas and dysphoric mood. The patient is nervous/anxious.   All other systems reviewed and are negative.     Allergies  Dilaudid; Fentanyl; Morphine and related; Percocet; Tetracyclines & related; and Sulfa antibiotics  Home Medications   Prior to Admission medications   Medication Sig Start Date End Date Taking? Authorizing Provider  cholestyramine Lucrezia Starch) 4 G packet Take 1 packet by mouth daily. 07/30/13   Historical Provider, MD  dicyclomine (BENTYL) 20 MG tablet Take 20 mg by mouth 4 (four) times daily -  before meals and at bedtime.    Historical Provider, MD  divalproex (DEPAKOTE) 500 MG DR tablet Take 1 tablet by mouth daily. 07/27/13   Historical Provider, MD  ergocalciferol (VITAMIN D2) 50000 UNITS capsule Take 50,000 Units by mouth once a week. On monday    Historical Provider, MD  ESTRACE VAGINAL 0.1 MG/GM vaginal cream Place 1 Applicatorful vaginally every Monday, Wednesday, and Friday. 06/30/13   Historical Provider, MD  lamoTRIgine (LAMICTAL) 200 MG tablet Take 200 mg by mouth daily.  07/19/12  Historical Provider, MD  Multiple Vitamin (MULTIVITAMIN) tablet Take 1 tablet by mouth daily.    Historical Provider, MD  simvastatin (ZOCOR) 40 MG tablet Take 40 mg by mouth every evening.    Historical Provider, MD  zolpidem (AMBIEN) 10 MG tablet Take 10 mg by mouth at bedtime as needed. For sleep    Historical Provider, MD   BP 152/87 mmHg  Pulse 104  Temp(Src) 98.1 F (36.7 C) (Oral)  Resp 18  Ht 5\' 5"  (1.651 m)  Wt 159 lb (72.122 kg)  BMI 26.46 kg/m2  SpO2 97% Physical Exam  Constitutional: She is oriented to person, place, and time. She appears well-developed and well-nourished. No distress.  HENT:  Head: Normocephalic and atraumatic.  Right  Ear: Hearing normal.  Left Ear: Hearing normal.  Nose: Nose normal.  Mouth/Throat: Oropharynx is clear and moist and mucous membranes are normal.  Eyes: Conjunctivae and EOM are normal. Pupils are equal, round, and reactive to light.  Neck: Normal range of motion. Neck supple.  Cardiovascular: Regular rhythm, S1 normal and S2 normal.  Exam reveals no gallop and no friction rub.   No murmur heard. Pulmonary/Chest: Effort normal and breath sounds normal. No respiratory distress. She exhibits no tenderness.  Abdominal: Soft. Normal appearance and bowel sounds are normal. There is no hepatosplenomegaly. There is no tenderness. There is no rebound, no guarding, no tenderness at McBurney's point and negative Murphy's sign. No hernia.  Musculoskeletal: Normal range of motion.  Neurological: She is alert and oriented to person, place, and time. She has normal strength. No cranial nerve deficit or sensory deficit. Coordination normal. GCS eye subscore is 4. GCS verbal subscore is 5. GCS motor subscore is 6.  Skin: Skin is warm, dry and intact. No rash noted. No cyanosis.  Psychiatric: Her speech is normal and behavior is normal. Her mood appears anxious. She exhibits a depressed mood. She expresses suicidal ideation.  Nursing note and vitals reviewed.   ED Course  Procedures (including critical care time) Labs Review Labs Reviewed  CBC  ACETAMINOPHEN LEVEL  COMPREHENSIVE METABOLIC PANEL  ETHANOL  SALICYLATE LEVEL  URINE RAPID DRUG SCREEN (HOSP PERFORMED)    Imaging Review No results found.   EKG Interpretation None      MDM   Final diagnoses:  None    Patient with with long history of depression has not been responding to outpatient management. Patient is currently suicidal. She will require inpatient psychiatric treatment. Medical screening evaluation performed.    Orpah Greek, MD 02/07/14 [phone]

## 2014-02-07 NOTE — ED Notes (Signed)
Pt changed into scrubs. Security called to wand patient.

## 2014-02-07 NOTE — ED Notes (Signed)
TTS at bedside. 

## 2014-02-07 NOTE — ED Notes (Signed)
Pt reports that she has been having thoughts of hurting herself. Reports that she is currently taking medication but has not been helping her. Pt reports that she has a plan to take pills. Pt denies any alcohol or drug abuse.

## 2014-02-08 DIAGNOSIS — F332 Major depressive disorder, recurrent severe without psychotic features: Secondary | ICD-10-CM | POA: Diagnosis not present

## 2014-02-08 DIAGNOSIS — R45851 Suicidal ideations: Secondary | ICD-10-CM | POA: Diagnosis not present

## 2014-02-08 MED ORDER — FOSFOMYCIN TROMETHAMINE 3 G PO PACK
3.0000 g | PACK | Freq: Once | ORAL | Status: AC
  Administered 2014-02-08: 3 g via ORAL
  Filled 2014-02-08 (×2): qty 3

## 2014-02-08 MED ORDER — CHOLESTYRAMINE 4 G PO PACK
4.0000 g | PACK | Freq: Every day | ORAL | Status: DC
  Administered 2014-02-08: 4 g via ORAL
  Filled 2014-02-08 (×2): qty 1

## 2014-02-08 NOTE — ED Provider Notes (Signed)
8:01 AM Nurse notified me of suspicious UA. Pt had been complaining of dysuria so she sent a UA, no back pain. I reviewed UA. Will tx w/ one dose of fosfomycin.   Pamella Pert, MD 02/08/14 [phone]

## 2014-02-08 NOTE — ED Notes (Signed)
PATIENT HAS BEEN ACCEPTED TO [facility]
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
## 2014-02-08 NOTE — ED Provider Notes (Signed)
4:55 PM patient is alert Glasgow Coma Score 15 cooperative ambulates without difficulty. Plan she will be transferred to [facility]. She is in agreement with transfer. Accepting physician Dr,.Reece Levy Results for orders placed or performed during the hospital encounter of 02/07/14  Acetaminophen level  Result Value Ref Range   Acetaminophen (Tylenol), Serum <10.0 (L) 10 - 30 ug/mL  CBC  Result Value Ref Range   WBC 6.3 4.0 - 10.5 K/uL   RBC 4.28 3.87 - 5.11 MIL/uL   Hemoglobin 12.9 12.0 - 15.0 g/dL   HCT 38.7 36.0 - 46.0 %   MCV 90.4 78.0 - 100.0 fL   MCH 30.1 26.0 - 34.0 pg   MCHC 33.3 30.0 - 36.0 g/dL   RDW 13.2 11.5 - 15.5 %   Platelets 253 150 - 400 K/uL  Comprehensive metabolic panel  Result Value Ref Range   Sodium 139 135 - 145 mmol/L   Potassium 4.0 3.5 - 5.1 mmol/L   Chloride 105 96 - 112 mEq/L   CO2 22 19 - 32 mmol/L   Glucose, Bld 120 (H) 70 - 99 mg/dL   BUN 9 6 - 23 mg/dL   Creatinine, Ser 1.17 (H) 0.50 - 1.10 mg/dL   Calcium 9.7 8.4 - 10.5 mg/dL   Total Protein 7.3 6.0 - 8.3 g/dL   Albumin 4.7 3.5 - 5.2 g/dL   AST 21 0 - 37 U/L   ALT 14 0 - 35 U/L   Alkaline Phosphatase 64 39 - 117 U/L   Total Bilirubin 0.6 0.3 - 1.2 mg/dL   GFR calc non Af Amer 50 (L) >90 mL/min   GFR calc Af Amer 57 (L) >90 mL/min   Anion gap 12 5 - 15  Ethanol (ETOH)  Result Value Ref Range   Alcohol, Ethyl (B) <5 0 - 9 mg/dL  Salicylate level  Result Value Ref Range   Salicylate Lvl <5.6 2.8 - 20.0 mg/dL  Urine Drug Screen  Result Value Ref Range   Opiates NONE DETECTED NONE DETECTED   Cocaine NONE DETECTED NONE DETECTED   Benzodiazepines POSITIVE (A) NONE DETECTED   Amphetamines NONE DETECTED NONE DETECTED   Tetrahydrocannabinol NONE DETECTED NONE DETECTED   Barbiturates NONE DETECTED NONE DETECTED  Urinalysis, Routine w reflex microscopic  Result Value Ref Range   Color, Urine YELLOW YELLOW   APPearance CLOUDY (A) CLEAR   Specific Gravity, Urine 1.020 1.005 - 1.030   pH 5.0 5.0  - 8.0   Glucose, UA NEGATIVE NEGATIVE mg/dL   Hgb urine dipstick SMALL (A) NEGATIVE   Bilirubin Urine NEGATIVE NEGATIVE   Ketones, ur NEGATIVE NEGATIVE mg/dL   Protein, ur NEGATIVE NEGATIVE mg/dL   Urobilinogen, UA 0.2 0.0 - 1.0 mg/dL   Nitrite POSITIVE (A) NEGATIVE   Leukocytes, UA SMALL (A) NEGATIVE  Urine microscopic-add on  Result Value Ref Range   Squamous Epithelial / LPF RARE RARE   WBC, UA 7-10 <3 WBC/hpf   RBC / HPF 0-2 <3 RBC/hpf   Bacteria, UA MANY (A) RARE   Casts HYALINE CASTS (A) NEGATIVE   Urine-Other MUCOUS PRESENT    No results found.   Orlie Dakin, MD 02/08/14 1701

## 2014-02-08 NOTE — ED Notes (Signed)
Per Ellin Mayhew request, pt husband was contacted and asked to bring pt Questran and Vaginal cream to hospital so it could be transported with pt.  Husband agreed and will be here in aprox. 45 minutes.

## 2014-02-08 NOTE — ED Notes (Signed)
Patient is agreeable to going to [facility]

## 2014-02-08 NOTE — ED Notes (Signed)
[facility] called for transport to [facility]

## 2014-02-08 NOTE — Progress Notes (Signed)
LCSW met with patient at the bedside after tele-psych re-evaluation. Patient will be discharged home. She has a Teacher, music currently: Letta Moynahan with an appointment on Jan 20 Also a standing appointment with therapist: Juliann Pulse on every Tuesday.  LCSW also gave resources for grief support groups, workshops, and individual supplemental counseling through hospice per patient request.  Patient to DC home today with husband coming to pick her up.  No other needs at this time.  Lane Hacker, MSW Clinical Social Work: Emergency Room [phone]

## 2014-02-08 NOTE — ED Notes (Signed)
Heloise Purpura has called and is speaking on phone via phone. Heloise Purpura advises that he will call the patients husband.

## 2014-02-08 NOTE — ED Notes (Signed)
Most recent MAR and Questran medications sent with [facility] with pt.

## 2014-02-08 NOTE — ED Notes (Signed)
Pts husband confirms that he has all of her belongings

## 2014-02-08 NOTE — Progress Notes (Signed)
Patient was accepted at by Dr. Dareen Piano at [facility], in room 112.   The number to call report to is [phone].  Verlon Setting, [place] Disposition staff 02/08/2014 1:11 PM

## 2014-02-08 NOTE — ED Notes (Signed)
Pt husband and patient advise they are confused by why she has to go to [facility]. Explained again to patient and to her husband that the np conrad had presented her evaluation to dr Louretta Shorten . The doctor feels that the patient needs to be stablized before she can be discharged home. That her safety is a primary concern (pt psych office sent her to the ed). He feels she may also have some time of labile emotions due to the rapid taper from the geodon last week. Called conrad and advised him that the patient and her spouse would like to speak to him.

## 2014-02-08 NOTE — ED Notes (Signed)
telepsych reeval in progress

## 2014-02-08 NOTE — Progress Notes (Signed)
Patient accepted at [facility].  Patient referred to: [facility]  1st [place], [place] Disposition staff 02/08/2014 12:56 PM

## 2014-02-08 NOTE — ED Notes (Signed)
Patient requests to speak with Lorraine May at [facility] before she is moved to [facility]. Will call him back again in about 15 minutes.

## 2014-02-27 ENCOUNTER — Other Ambulatory Visit: Payer: Self-pay | Admitting: Obstetrics and Gynecology

## 2014-02-28 LAB — CYTOLOGY - PAP

## 2014-04-30 ENCOUNTER — Other Ambulatory Visit: Payer: Self-pay | Admitting: Family Medicine

## 2014-04-30 DIAGNOSIS — R198 Other specified symptoms and signs involving the digestive system and abdomen: Secondary | ICD-10-CM

## 2014-04-30 DIAGNOSIS — R0989 Other specified symptoms and signs involving the circulatory and respiratory systems: Secondary | ICD-10-CM

## 2014-05-06 ENCOUNTER — Ambulatory Visit
Admission: RE | Admit: 2014-05-06 | Discharge: 2014-05-06 | Disposition: A | Discharge status: Home / Self Care | Payer: BLUE CROSS/BLUE SHIELD | Source: Ambulatory Visit | Attending: Family Medicine | Admitting: Family Medicine

## 2014-05-06 DIAGNOSIS — R0989 Other specified symptoms and signs involving the circulatory and respiratory systems: Secondary | ICD-10-CM

## 2014-05-06 DIAGNOSIS — R198 Other specified symptoms and signs involving the digestive system and abdomen: Secondary | ICD-10-CM

## 2014-05-09 ENCOUNTER — Ambulatory Visit
Admission: RE | Admit: 2014-05-09 | Discharge: 2014-05-09 | Disposition: A | Discharge status: Home / Self Care | Payer: Self-pay | Source: Ambulatory Visit | Attending: Family Medicine | Admitting: Family Medicine

## 2014-05-09 ENCOUNTER — Other Ambulatory Visit: Payer: Self-pay | Admitting: Family Medicine

## 2014-05-09 DIAGNOSIS — F3289 Other specified depressive episodes: Secondary | ICD-10-CM

## 2014-08-19 ENCOUNTER — Encounter: Payer: Self-pay | Admitting: Genetic Counselor

## 2014-09-04 ENCOUNTER — Ambulatory Visit (INDEPENDENT_AMBULATORY_CARE_PROVIDER_SITE_OTHER): Payer: BLUE CROSS/BLUE SHIELD | Admitting: Psychiatry

## 2014-09-04 ENCOUNTER — Encounter (HOSPITAL_COMMUNITY): Payer: Self-pay | Admitting: Psychiatry

## 2014-09-04 DIAGNOSIS — F331 Major depressive disorder, recurrent, moderate: Secondary | ICD-10-CM
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
## 2014-09-09 ENCOUNTER — Telehealth (HOSPITAL_COMMUNITY): Payer: Self-pay | Admitting: *Deleted

## 2014-09-09 NOTE — Telephone Encounter (Signed)
Returned pt's call from a phone memo received from co-worker. Pt informed Probation officer that she wants to commence [place] on a self-pay basis, as she is unwilling to await insurance approval process. Writer informed pt that he would investigate what must be done in order to accommodate this request.

## 2014-09-09 NOTE — Telephone Encounter (Signed)
Called pt to schedule appointment for Motor Threshold Determination for Lorraine May tx. Appointment made for 3:00 on Thurs 09/12/14. Writer administered PHQ-9 over the phone to pt with pt scoring a 15 (moderately severe depression). This is increased from her score of 11 (moderate depression) collected at her [place] consultation on 09/04/2014. Pt stated that she has had several "really bad days" with her depression since coming in for [place] consultation, which explains the increase in PHQ-9 score. Pt stated that she feels that her medication is not working to sufficiently alleviate her depressive symptoms. Writer informed pt that he would submit to her insurance for authorization for [place] tx, and if they denied authorization, we will proceed with tx on self-pay basis as previously discussed. Pt agreed with this plan.

## 2014-09-10 ENCOUNTER — Telehealth (HOSPITAL_COMMUNITY): Payer: Self-pay | Admitting: *Deleted

## 2014-09-10 NOTE — Telephone Encounter (Signed)
Called pt again this morning to administer Beck's Depression Inventory prior to contacting her insurance to request prior authorization for Transcranial Magnetic Stimulation for her MDD. Administered BDI over the phone with pt scoring a 37 (severe depression) which is increased from her score of 29 (moderate depression) collected at her [place] consultation conducted on 09/04/14. Will contact pt's insurance to request prior auth.

## 2014-09-12 ENCOUNTER — Other Ambulatory Visit (HOSPITAL_COMMUNITY): Payer: BLUE CROSS/BLUE SHIELD | Admitting: Psychiatry

## 2014-09-19 ENCOUNTER — Other Ambulatory Visit (HOSPITAL_COMMUNITY): Payer: BLUE CROSS/BLUE SHIELD | Attending: Psychiatry | Admitting: Psychiatry

## 2014-09-19 VITALS — BP 130/87 | HR 78 | Ht 65.0 in | Wt 159.4 lb

## 2014-09-19 DIAGNOSIS — F331 Major depressive disorder, recurrent, moderate: Secondary | ICD-10-CM

## 2014-09-19 DIAGNOSIS — F329 Major depressive disorder, single episode, unspecified: Secondary | ICD-10-CM | POA: Diagnosis not present

## 2014-09-19 NOTE — Progress Notes (Signed)
Patient ID: Lorraine May, female   DOB: 04/08/1955, 59 y.o.   MRN: [id number] Pt reported to [facility] for cortical mapping and motor threshold determination for Repetitive Transcranial Magnetic Stimulation treatment for Major Depressive Disorder. Pt accompanied by her husband for social support. Pt completed a PHQ-9 with a score of 17 (modertely severe depression). Pt also completed a Beck's Depression Inventory with a score of 40 (severe depression). Prior to procedure, pt signed an informed consent agreement for [place] treatment. Pt's treatment area was found by applying single pulses to her left motor cortex, hunting along the anterior/posterior plane and along the superior oblique angle until the best motor response was elicited from the pt's right thumb. The best response was observed at 6.2 cm A/P and 40 degrees SOA, with a coil angle of 0 degrees. Pt's motor threshold was calculated using the [employer]'s proprietary MT Assist algorithm, which produced a calculated motor threshold of 1.12 SMT. Per these findings, pt's treatment parameters are as follows: A/P -- 11.7 cm, SOA -- 40 degrees, Coil Angle -- 0 degrees, Motor Threshold -- 1.12 SMT. With these parameters, the pt will receive 30 sessions of TMS according to the following protocol: 3000 pulses per session, with stimulation in bursts of pulses lasting 4 seconds at a frequency of 10 Hz, separated by 26 seconds of rest. After determining pt's tx parameters, coil was moved to the treatment location, and the first burst of pulses was applied at a reduced power of 80%MT. Pt reported no complaints, and stated that the stimulation was tolerable, so the first tx session was given to the pt. Stimulation power was gradually titrated up from 80%MT to 100%MT. Will attempt to titrate up to 120%MT at the next tx session. Pt tolerated tx well. Pt with no complaints post-tx. Pt and her husband departed from clinic without issue.   Cortical  mapping and motor threshold done. Patient had her first treatment without any complications Hampton Abbot, MD

## 2014-09-20 ENCOUNTER — Other Ambulatory Visit (INDEPENDENT_AMBULATORY_CARE_PROVIDER_SITE_OTHER): Payer: BLUE CROSS/BLUE SHIELD | Admitting: *Deleted

## 2014-09-20 VITALS — BP 145/86 | HR 82

## 2014-09-20 DIAGNOSIS — F332 Major depressive disorder, recurrent severe without psychotic features: Secondary | ICD-10-CM | POA: Diagnosis not present

## 2014-09-20 DIAGNOSIS — F329 Major depressive disorder, single episode, unspecified: Secondary | ICD-10-CM | POA: Diagnosis not present

## 2014-09-20 NOTE — Progress Notes (Signed)
Patient ID: Lorraine May, female   DOB: [birth year], 59 y.o.   MRN: [id number] Pt reported to [facility] for Repetitive Transcranial Magnetic Stimulation treatment for Major Depressive Disorder. Pt accompanied by her husband. Pt reported no change in medication regimen, alcohol/substance use, caffeine consumption, sleep pattern, or metal implant status since previous tx. Pt reported that she had a headache the previous day after her first session of tx. Pt stated that the headache began approximately 1 hour after departing from the clinic, and that her pain was a 4-5 out of 10, with 0 being none and 10 being the worst. Pt reported that she took 600 mg of ibuprofen, which resolved the headache. Pt denied any other side effects from the treatment. Pt reported that since her [place] consultation on 09/04/2014, her prescribing psychiatrist discontinued her nortriptyline due to intolerable side effects including dry mouth, dry eyes, insomnia, and dizziness. Pt reported that she plans to start a low dose of Pristiq soon, concurrent with receiving [place] at this clinic. Pt tolerated tx well. Power was titrated up to 120%MT, where it will remain for the rest of her course of treatment. Pt with no complaints post-tx. Pt and her husband departed from clinic without issue.

## 2014-09-23 ENCOUNTER — Encounter (HOSPITAL_COMMUNITY): Payer: Self-pay | Admitting: Psychiatry

## 2014-09-23 ENCOUNTER — Other Ambulatory Visit (INDEPENDENT_AMBULATORY_CARE_PROVIDER_SITE_OTHER): Payer: BLUE CROSS/BLUE SHIELD | Admitting: *Deleted

## 2014-09-23 DIAGNOSIS — F332 Major depressive disorder, recurrent severe without psychotic features: Secondary | ICD-10-CM | POA: Diagnosis not present

## 2014-09-23 DIAGNOSIS — F329 Major depressive disorder, single episode, unspecified: Secondary | ICD-10-CM | POA: Diagnosis not present

## 2014-09-23 NOTE — Progress Notes (Signed)
Patient ID: Lorraine May, female   DOB: [birth year], 59 y.o.   MRN: [id number] Pt reported to [facility] for Repetitive Transcranial Magnetic Stimulation treatment for Major Depressive Disorder. Pt reported no change in medication regimen, alcohol/substance use, caffeine consumption, sleep pattern, or metal implant status since previous tx. Pt reported that she had another mild headache after her previous treatment session on 09/20/14. Pt reported that she had no other side effects as a result of tx. Pt brought in her own noise-canceling headphones (earbud type) to wear during tx today. They headphones did not have wires that loop around the ear, and the pt supplied documentation from the headphone manufacturer that the headphones cancel 35-40 dB of sound, which exceeds the [employer] suggested noise-cancelation level of 30 dB. Pt was therefore permitted to [place], but was instructed to notify this writer if she began to experience any heating or other adverse effects. Pt said she would. Pt tolerated tx well. Power remained at 120%MT for the duration of tx. Pt with no complaints post-tx. Pt departed from clinic without issue.

## 2014-09-24 ENCOUNTER — Other Ambulatory Visit (INDEPENDENT_AMBULATORY_CARE_PROVIDER_SITE_OTHER): Payer: BLUE CROSS/BLUE SHIELD | Admitting: Emergency Medicine

## 2014-09-24 VITALS — BP 148/78 | HR 76

## 2014-09-24 DIAGNOSIS — F332 Major depressive disorder, recurrent severe without psychotic features: Secondary | ICD-10-CM | POA: Diagnosis not present

## 2014-09-24 DIAGNOSIS — F329 Major depressive disorder, single episode, unspecified: Secondary | ICD-10-CM | POA: Diagnosis not present

## 2014-09-24 NOTE — Progress Notes (Signed)
Pt reported to [facility] for Transcranial Magnetic Stimulation treatment for Major Depressive Disorder. Pt reported no change in medication regimen, alcohol/substance use, caffeine consumption, sleep pattern, or metal implant status since previous tx. Pt brought in her noise-canceling headphones during treatment session again. Pt reported sensation in her upper cheek, along the bridge of her nose and upper teeth - appropriate modifications were made to coil placement. Pt tolerated tx well. Power remained at 120%MT for the duration of tx. Pt with no complaints post-tx. Pt departed from clinic without issue.

## 2014-09-25 ENCOUNTER — Other Ambulatory Visit (INDEPENDENT_AMBULATORY_CARE_PROVIDER_SITE_OTHER): Payer: BLUE CROSS/BLUE SHIELD | Admitting: *Deleted

## 2014-09-25 VITALS — BP 122/75 | HR 86

## 2014-09-25 DIAGNOSIS — F332 Major depressive disorder, recurrent severe without psychotic features: Secondary | ICD-10-CM | POA: Diagnosis not present

## 2014-09-25 DIAGNOSIS — F329 Major depressive disorder, single episode, unspecified: Secondary | ICD-10-CM | POA: Diagnosis not present

## 2014-09-25 NOTE — Progress Notes (Signed)
Patient ID: Lorraine May, female   DOB: March 23, 1955, 59 y.o.   MRN: [id number] Pt reported to [facility] for Repetitive Transcranial Magnetic Stimulation treatment for Major Depressive Disorder. Pt accompanied by her husband for tx. Pt reported that she could feel the stimulation radiating down the left side of her face and into her teeth during tx the previous day. Writer assured pt that this is a normal side effect of TMS tx and asked if the sensation was tolerable. Pt stated that she was able to tolerate the sensation. Pt reported no change in medication regimen, alcohol/substance use, caffeine consumption, sleep pattern, or metal implant status since previous tx. Pt completed a PHQ-9 with a score of 13 (moderate depression). This is decreased from her score of 17 the previous week. Pt tolerated tx well. Power remained at 120% for the duration of tx. Tx was paused numerous times to adjust coil placement and pt's head positioning, as the pt reported that that stimulation felt slightly more intense today. Pt with no complaints post-tx. Pt and her husband departed from clinic without issue.

## 2014-09-26 ENCOUNTER — Other Ambulatory Visit (INDEPENDENT_AMBULATORY_CARE_PROVIDER_SITE_OTHER): Payer: BLUE CROSS/BLUE SHIELD | Admitting: Emergency Medicine

## 2014-09-26 VITALS — BP 130/86 | HR 76

## 2014-09-26 DIAGNOSIS — F329 Major depressive disorder, single episode, unspecified: Secondary | ICD-10-CM | POA: Diagnosis not present

## 2014-09-26 DIAGNOSIS — F332 Major depressive disorder, recurrent severe without psychotic features: Secondary | ICD-10-CM | POA: Diagnosis not present

## 2014-09-26 NOTE — Progress Notes (Signed)
Pt reported to [facility] for Repetitive Transcranial Magnetic Stimulation treatment for Major Depressive Disorder. Pt reported no change in alcohol/substance use, caffeine consumption, or metal implant status since previous tx. However, there has been modifications in medication regimen. Pt stated she has been taking Ambien 10mg  fifteen minutes before she goes to bed (9:45 pm or 10:00 pm). Pt currently started taking Ambien CR 12.5mg  as of last night (09/25/14) around 10:00 pm. Pt reported that the adjustments made to medications significantly helped her sleep better last night. Pt stated she normally gets 4-5 hours of sleep. Pt stated she slept for about 7 hours last night. During treatment, pt reported that she could feel the sensation along the left side of her face and into her teeth. Pt was assured again that this is a normal side effect of TMS tx. Pt stated that she was able to tolerate the sensation. Power remained at 120% for the duration of tx. Pt with no complaints post-tx. Pt departed from clinic without issue

## 2014-09-27 ENCOUNTER — Other Ambulatory Visit (INDEPENDENT_AMBULATORY_CARE_PROVIDER_SITE_OTHER): Payer: BLUE CROSS/BLUE SHIELD | Admitting: Emergency Medicine

## 2014-09-27 VITALS — BP 140/79 | HR 71

## 2014-09-27 DIAGNOSIS — F332 Major depressive disorder, recurrent severe without psychotic features: Secondary | ICD-10-CM

## 2014-09-27 DIAGNOSIS — F329 Major depressive disorder, single episode, unspecified: Secondary | ICD-10-CM | POA: Diagnosis not present

## 2014-09-27 NOTE — Progress Notes (Signed)
Pt reported to [facility] for Repetitive Transcranial Magnetic Stimulation treatment for Major Depressive Disorder. Pt reported no change in medication regiment, alcohol/substance use, caffeine consumption, sleep pattern or metal implant status since previous tx. Pt reported she experienced a nightmare last night and only had 6 hours of sleep. During treatment, pt reported that she could feel the sensation along the left side of her face and into her teeth. Pt expressed that shutting her eyes and opening her mouth removes the slight discomfort in her face. Pt used noise canceling headphones to listen to music during treatment. Power remained at 120% for the duration of tx. Pt with no complaints post-tx. Pt departed from clinic without issue.

## 2014-09-27 NOTE — Addendum Note (Signed)
Addended by: Dionne Bucy on: 09/27/2014 01:01 PM   Modules accepted: Medications

## 2014-09-30 ENCOUNTER — Other Ambulatory Visit (INDEPENDENT_AMBULATORY_CARE_PROVIDER_SITE_OTHER): Payer: BLUE CROSS/BLUE SHIELD | Admitting: Emergency Medicine

## 2014-09-30 VITALS — BP 134/78 | HR 61

## 2014-09-30 DIAGNOSIS — F329 Major depressive disorder, single episode, unspecified: Secondary | ICD-10-CM | POA: Diagnosis not present

## 2014-09-30 DIAGNOSIS — F332 Major depressive disorder, recurrent severe without psychotic features: Secondary | ICD-10-CM

## 2014-09-30 NOTE — Progress Notes (Signed)
Pt reported to [facility] for Repetitive Transcranial Magnetic Stimulation treatment for Major Depressive Disorder. Pt reported no change in alcohol/substance use, caffeine consumption, or metal implant status since previous tx. However, there has been an addition to pt medication regimen. Pt reported starting an antidepressant - Pristiq 25 mg. Pt started on Saturday (8/27), took medication at 8:00am. Pt expressed complaints in sleeping pattern (sleeping only 5-6 hours). Writer offered suggestions such as aromatherapy. Pt expressed great interest. During tx, patient listened to music using noise canceling headphones. Power remained at 120% for the duration of tx. Pt with no complaints post-tx. Pt departed from clinic without issue.

## 2014-10-01 ENCOUNTER — Other Ambulatory Visit (INDEPENDENT_AMBULATORY_CARE_PROVIDER_SITE_OTHER): Payer: BLUE CROSS/BLUE SHIELD | Admitting: Emergency Medicine

## 2014-10-01 VITALS — BP 140/80 | HR 74

## 2014-10-01 DIAGNOSIS — F329 Major depressive disorder, single episode, unspecified: Secondary | ICD-10-CM | POA: Diagnosis not present

## 2014-10-01 DIAGNOSIS — F332 Major depressive disorder, recurrent severe without psychotic features: Secondary | ICD-10-CM | POA: Diagnosis not present

## 2014-10-01 NOTE — Progress Notes (Signed)
Pt reported to [facility] for Repetitive Transcranial Magnetic Stimulation treatment for Major Depressive Disorder. Pt reported no change in medication regiment, alcohol/substance use, caffeine consumption, sleep pattern or metal implant status since previous tx. Pt used noise canceling headphones to listen to music during treatment. Power remained at 120% for the duration of tx. Pt with no complaints post-tx. Pt departed from clinic without issue.

## 2014-10-02 ENCOUNTER — Other Ambulatory Visit (INDEPENDENT_AMBULATORY_CARE_PROVIDER_SITE_OTHER): Payer: BLUE CROSS/BLUE SHIELD | Admitting: Emergency Medicine

## 2014-10-02 VITALS — BP 136/82 | HR 63

## 2014-10-02 DIAGNOSIS — F329 Major depressive disorder, single episode, unspecified: Secondary | ICD-10-CM | POA: Diagnosis not present

## 2014-10-02 DIAGNOSIS — F332 Major depressive disorder, recurrent severe without psychotic features: Secondary | ICD-10-CM | POA: Diagnosis not present

## 2014-10-02 NOTE — Progress Notes (Signed)
    Pt reported to [facility] for Repetitive Transcranial Magnetic Stimulation treatment for Major Depressive Disorder. Pt reported no change in medication regiment, alcohol/substance use, caffeine consumption, sleep pattern or metal implant status since previous tx. Pt comes to tx with pleasant affect. Pt reported she slept well and did not experience any nightmares last night. Pt mentioned she spoke with therapist about aromatherpy. Pt used noise canceling headphones to listen to music during treatment. Power remained at 120% for the duration of tx. Pt with no complaints post-tx. Before departing, pt completed PhQ9 and scored 14. Pt departed from clinic without issue.

## 2014-10-03 ENCOUNTER — Other Ambulatory Visit (HOSPITAL_COMMUNITY): Payer: BLUE CROSS/BLUE SHIELD | Attending: Psychiatry | Admitting: Emergency Medicine

## 2014-10-03 VITALS — BP 125/84 | HR 66

## 2014-10-03 DIAGNOSIS — F331 Major depressive disorder, recurrent, moderate: Secondary | ICD-10-CM | POA: Insufficient documentation

## 2014-10-03 DIAGNOSIS — F332 Major depressive disorder, recurrent severe without psychotic features: Secondary | ICD-10-CM

## 2014-10-03 DIAGNOSIS — F329 Major depressive disorder, single episode, unspecified: Secondary | ICD-10-CM | POA: Diagnosis not present

## 2014-10-03 NOTE — Progress Notes (Signed)
Pt reported to [facility] for Repetitive Transcranial Magnetic Stimulation treatment for Major Depressive Disorder. Pt reported no change in medication regimen, alcohol/substance use, caffeine consumption, sleep pattern or metal implant status since previous tx. Pt came to tx with pleasant affect. Pt reported she has been sleeping well - averaging 6-7 hours of sleep. Pt used noise-canceling headphones (ear bud type) to listen to music during tx. Previous sessions, Pt reported that she could feel the stimulation radiating down the left side of her face and into her teeth. Pt states that the sensation has lessened - allowing more tolerable experience.  Power remained at 120% for the duration of tx. Pt with no complaints post-tx. Pt departed from clinic without issue.

## 2014-10-04 ENCOUNTER — Other Ambulatory Visit (INDEPENDENT_AMBULATORY_CARE_PROVIDER_SITE_OTHER): Payer: BLUE CROSS/BLUE SHIELD | Admitting: Psychiatry

## 2014-10-04 VITALS — BP 126/80 | HR 63

## 2014-10-04 DIAGNOSIS — F332 Major depressive disorder, recurrent severe without psychotic features: Secondary | ICD-10-CM

## 2014-10-04 NOTE — Progress Notes (Signed)
Patient ID: Lorraine May, female   DOB: 06/03/1955, 59 y.o.   MRN: [id number] Patient presented to for [place] scheduled treatment with husband and denied any current symptoms or complaints.  Patient reported she gets headaches at times and has since started [place] but not currently and these have not interfered with her continuing treatment. Patient reported no change in medication regimen, alcohol/substance use, caffeine consumption, sleep pattern or metal implant status since previous treatment.  Patient presented with appropriate affect, level and pleasant mood and stated since starting [place] she no longer doesn't "want to wake up'.   Patient denied any suicidal or homicidal ideations and states a friend even commented on noticing she seems to have more energy. Patient without any problems or concerns throughout treatment and will return for next treatment on 10/08/14.  Post treatment patient denied any pain,headaches or other symptoms and left [facility] outpatient with her husband.  Patient to call if any problems.

## 2014-10-07 ENCOUNTER — Other Ambulatory Visit (HOSPITAL_COMMUNITY): Payer: BLUE CROSS/BLUE SHIELD

## 2014-10-08 ENCOUNTER — Other Ambulatory Visit (INDEPENDENT_AMBULATORY_CARE_PROVIDER_SITE_OTHER): Payer: BLUE CROSS/BLUE SHIELD | Admitting: Emergency Medicine

## 2014-10-08 VITALS — BP 122/68 | HR 62

## 2014-10-08 DIAGNOSIS — F332 Major depressive disorder, recurrent severe without psychotic features: Secondary | ICD-10-CM | POA: Diagnosis not present

## 2014-10-08 DIAGNOSIS — F329 Major depressive disorder, single episode, unspecified: Secondary | ICD-10-CM | POA: Diagnosis not present

## 2014-10-08 NOTE — Progress Notes (Signed)
Pt reported to [facility] for Repetitive Transcranial Magnetic Stimulation treatment for Major Depressive Disorder. Pt reported no change in medication regimen, alcohol/substance use, caffeine consumption, sleep pattern or metal implant status since previous tx. Pt reported to tx with pleasant, level and appropriate mood. Pt reports having more energy and is able to provide support to her close friends and family. Pt expressed having no problems or concerns through the duration of treatment. Pt used noise-canceling headphones (ear bud type) to listen to music during tx. Power remained at 120% for the duration of tx. Pt with no complaints post-tx. Pt departed from clinic without issue.

## 2014-10-09 ENCOUNTER — Other Ambulatory Visit (INDEPENDENT_AMBULATORY_CARE_PROVIDER_SITE_OTHER): Payer: BLUE CROSS/BLUE SHIELD | Admitting: Emergency Medicine

## 2014-10-09 VITALS — BP 126/94 | HR 68

## 2014-10-09 DIAGNOSIS — F332 Major depressive disorder, recurrent severe without psychotic features: Secondary | ICD-10-CM | POA: Diagnosis not present

## 2014-10-09 DIAGNOSIS — F329 Major depressive disorder, single episode, unspecified: Secondary | ICD-10-CM | POA: Diagnosis not present

## 2014-10-09 NOTE — Progress Notes (Signed)
Pt reported to [facility] for Repetitive Transcranial Magnetic Stimulation treatment for Major Depressive Disorder. Pt presented with pleasant affect. Pt reported no change in alcohol/substance use, caffeine consumption, or metal implant status since previous tx. However, there were changes in medication. Pt reported terminating Pristiq (25 mg) due to headaches during the morning and night. Pt also reported changes in sleeping pattern - pt has been experiencing nightmares (leopard chasing after her) Pt completed a PHQ-9 with a score of 12 (moderate depression). This is decreased 2 pts from his score of 14 the previous week. Pt tolerated tx well. Power remained at 120% for the duration of tx. Pt with no complaints post-tx. Pt departed from clinic without issue.

## 2014-10-09 NOTE — Addendum Note (Signed)
Addended by: Dionne Bucy on: 10/09/2014 04:20 PM   Modules accepted: Medications

## 2014-10-10 ENCOUNTER — Other Ambulatory Visit (INDEPENDENT_AMBULATORY_CARE_PROVIDER_SITE_OTHER): Payer: BLUE CROSS/BLUE SHIELD | Admitting: *Deleted

## 2014-10-10 VITALS — BP 138/86 | HR 67

## 2014-10-10 DIAGNOSIS — F332 Major depressive disorder, recurrent severe without psychotic features: Secondary | ICD-10-CM | POA: Diagnosis not present

## 2014-10-10 DIAGNOSIS — F329 Major depressive disorder, single episode, unspecified: Secondary | ICD-10-CM | POA: Diagnosis not present

## 2014-10-10 NOTE — Progress Notes (Signed)
Patient ID: Lorraine May, female   DOB: Dec 18, 1955, 59 y.o.   MRN: [id number]  Pt reported to [place] [facility] forTranscranial Magnetic Stimulation for Major Depressive Disorder at 3:00 pm. Pt appears pleasant and talkative this afternoon. Pt reported no change in metal implant status, appetite/intake, caffeine consumption or alcohol/substance use. Pt did report that she has had a hard time sleeping, but this was previously reported to [employer] coordinator.  Pt tolerated TMS treatment. %MT remained at 120% for duration of treatment. Pt with no complaints post treatment. Pt departed from clinic without issue.

## 2014-10-11 ENCOUNTER — Other Ambulatory Visit (INDEPENDENT_AMBULATORY_CARE_PROVIDER_SITE_OTHER): Payer: BLUE CROSS/BLUE SHIELD | Admitting: Emergency Medicine

## 2014-10-11 VITALS — BP 130/63 | HR 61

## 2014-10-11 DIAGNOSIS — F332 Major depressive disorder, recurrent severe without psychotic features: Secondary | ICD-10-CM

## 2014-10-11 DIAGNOSIS — F329 Major depressive disorder, single episode, unspecified: Secondary | ICD-10-CM | POA: Diagnosis not present

## 2014-10-11 NOTE — Progress Notes (Signed)
Pt reported to [facility] for Repetitive Transcranial Magnetic Stimulation treatment for Major Depressive Disorder. Pt reported no change in medication regimen, alcohol/substance use, caffeine consumption, sleep pattern or metal implant status since previous tx. Pt reported to tx with pleasant, level and appropriate mood. Pt reports that she started using meditation tape before bedtime to assist with sleeping pattern. Pt slept for 6 hours without waking up. Pt expressed having no problems or concerns through the duration of treatment. Pt used noise-canceling headphones (ear bud type) to listen to music during tx. Power remained at 120% for the duration of tx. Pt with no complaints post-tx. Pt departed from clinic without issue.

## 2014-10-14 ENCOUNTER — Other Ambulatory Visit (INDEPENDENT_AMBULATORY_CARE_PROVIDER_SITE_OTHER): Payer: BLUE CROSS/BLUE SHIELD | Admitting: Emergency Medicine

## 2014-10-14 VITALS — BP 118/76 | HR 68

## 2014-10-14 DIAGNOSIS — F329 Major depressive disorder, single episode, unspecified: Secondary | ICD-10-CM | POA: Diagnosis not present

## 2014-10-14 DIAGNOSIS — F332 Major depressive disorder, recurrent severe without psychotic features: Secondary | ICD-10-CM | POA: Diagnosis not present

## 2014-10-14 NOTE — Progress Notes (Signed)
   Pt reported to [facility] for Repetitive Transcranial Magnetic Stimulation treatment for Major Depressive Disorder. Pt reported no change in medication regimen, alcohol/substance use, caffeine consumption, sleep pattern or metal implant status since previous tx. Pt reported to tx with pleasant, level and appropriate mood. Pt reported she had a good weekend with her husband. Pt looked refreshed. Pt used noise-canceling headphones (ear bud type) to listen to music during tx. Power remained at 120% for the duration of tx. Pt with no complaints post-tx. Pt departed from clinic without issue.

## 2014-10-15 ENCOUNTER — Other Ambulatory Visit (INDEPENDENT_AMBULATORY_CARE_PROVIDER_SITE_OTHER): Payer: BLUE CROSS/BLUE SHIELD | Admitting: Emergency Medicine

## 2014-10-15 VITALS — BP 123/78 | HR 64

## 2014-10-15 DIAGNOSIS — F329 Major depressive disorder, single episode, unspecified: Secondary | ICD-10-CM | POA: Diagnosis not present

## 2014-10-15 DIAGNOSIS — F332 Major depressive disorder, recurrent severe without psychotic features: Secondary | ICD-10-CM

## 2014-10-15 NOTE — Progress Notes (Signed)
Pt reported to [facility] for Repetitive Transcranial Magnetic Stimulation treatment for Major Depressive Disorder. Pt reported no change in medication regimen, alcohol/substance use, caffeine consumption, sleep pattern or metal implant status since previous tx. Pt reported to tx with pleasant, level and appropriate mood.  Pt used noise-canceling headphones (ear bud type) to listen to music during tx. Power remained at 120% for the duration of tx. Pt with no complaints post-tx. Pt departed from clinic without issue.

## 2014-10-16 ENCOUNTER — Other Ambulatory Visit (INDEPENDENT_AMBULATORY_CARE_PROVIDER_SITE_OTHER): Payer: BLUE CROSS/BLUE SHIELD | Admitting: Emergency Medicine

## 2014-10-16 VITALS — BP 115/71 | HR 64

## 2014-10-16 DIAGNOSIS — F329 Major depressive disorder, single episode, unspecified: Secondary | ICD-10-CM | POA: Diagnosis not present

## 2014-10-16 DIAGNOSIS — F332 Major depressive disorder, recurrent severe without psychotic features: Secondary | ICD-10-CM

## 2014-10-16 NOTE — Progress Notes (Signed)
Pt reported to [facility] for Repetitive Transcranial Magnetic Stimulation treatment for Major Depressive Disorder. Pt reported no change in medication regimen, alcohol/substance use, caffeine consumption, sleep pattern or metal implant status since previous tx. Pt reported to tx with pleasant, level and appropriate mood. Pt shared with Probation officer about her dinner date with one of her good female friends. Pt mentioned she had a great time, she was being a great support system to her friend. Pt presented a great affect.Pt completed a PHQ-9 with a score of 10 (moderate depression). This is decreased 2 pts from his score of 12 the previous week.  Pt used noise-canceling headphones (ear bud type) to listen to music during tx. Power remained at 120% for the duration of tx. Pt with no complaints post-tx. Pt departed from clinic without issue.

## 2014-10-17 ENCOUNTER — Other Ambulatory Visit (INDEPENDENT_AMBULATORY_CARE_PROVIDER_SITE_OTHER): Payer: BLUE CROSS/BLUE SHIELD | Admitting: Emergency Medicine

## 2014-10-17 VITALS — BP 123/72 | HR 61

## 2014-10-17 DIAGNOSIS — F332 Major depressive disorder, recurrent severe without psychotic features: Secondary | ICD-10-CM | POA: Diagnosis not present

## 2014-10-17 DIAGNOSIS — F329 Major depressive disorder, single episode, unspecified: Secondary | ICD-10-CM | POA: Diagnosis not present

## 2014-10-17 NOTE — Progress Notes (Signed)
Pt reported to [facility] for Repetitive Transcranial Magnetic Stimulation treatment for Major Depressive Disorder. Pt reported no change in medication regimen, alcohol/substance use, caffeine consumption, sleep pattern or metal implant status since previous tx. Pt reported to tx with pleasant, level and appropriate mood. Pt used noise-canceling headphones (ear bud type) to listen to music during tx. Power remained at 120% for the duration of tx. Pt with no complaints post-tx. Pt departed from clinic without issue.

## 2014-10-18 ENCOUNTER — Other Ambulatory Visit (INDEPENDENT_AMBULATORY_CARE_PROVIDER_SITE_OTHER): Payer: BLUE CROSS/BLUE SHIELD | Admitting: Emergency Medicine

## 2014-10-18 VITALS — BP 118/70 | HR 60

## 2014-10-18 DIAGNOSIS — F329 Major depressive disorder, single episode, unspecified: Secondary | ICD-10-CM | POA: Diagnosis not present

## 2014-10-18 DIAGNOSIS — F332 Major depressive disorder, recurrent severe without psychotic features: Secondary | ICD-10-CM | POA: Diagnosis not present

## 2014-10-18 NOTE — Progress Notes (Signed)
Pt reported to [facility] for Repetitive Transcranial Magnetic Stimulation treatment for Major Depressive Disorder. Pt reported no change in medication regimen, alcohol/substance use, caffeine consumption, sleep pattern or metal implant status since previous tx. Pt reported to tx with pleasant, level and appropriate mood. Pt expressed excitement about this weekend - pt's son and daughter in-law will be coming over for dinner tomorrow afternoon. Pt used noise-canceling headphones (ear bud type) to listen to music during tx. During treatment, pt became anxious and dry mouthed. Tx session had to be paused at 19:17. Writer went to get a cup of water for pt. Pt felt better after drinking water and sitting up right for a few minutes. Tx session resumed. Power remained at 120% for the duration of tx. Pt with no complaints post-tx. Pt departed from clinic without issue.

## 2014-10-21 ENCOUNTER — Other Ambulatory Visit (INDEPENDENT_AMBULATORY_CARE_PROVIDER_SITE_OTHER): Payer: BLUE CROSS/BLUE SHIELD | Admitting: Emergency Medicine

## 2014-10-21 VITALS — BP 123/75 | HR 65

## 2014-10-21 DIAGNOSIS — F332 Major depressive disorder, recurrent severe without psychotic features: Secondary | ICD-10-CM | POA: Diagnosis not present

## 2014-10-21 DIAGNOSIS — F329 Major depressive disorder, single episode, unspecified: Secondary | ICD-10-CM | POA: Diagnosis not present

## 2014-10-21 NOTE — Progress Notes (Signed)
Pt reported to [facility] for Repetitive Transcranial Magnetic Stimulation treatment for Major Depressive Disorder. Pt reported no change in medication regimen, alcohol/substance use, caffeine consumption, sleep pattern or metal implant status since previous tx. Pt expressed that she was stressed about her dental procedure. She did not want to miss any tx sessions. Writer offered to come in early tomorrow morning. Pt expressed this relieved her stress was very appreciative. Pt used noise-canceling headphones (ear bud type) to listen to music during tx.  Power remained at 120% for the duration of tx. Pt with no complaints post-tx. Pt departed from clinic without issue.

## 2014-10-22 ENCOUNTER — Other Ambulatory Visit (INDEPENDENT_AMBULATORY_CARE_PROVIDER_SITE_OTHER): Payer: BLUE CROSS/BLUE SHIELD | Admitting: Emergency Medicine

## 2014-10-22 VITALS — BP 125/78 | HR 73

## 2014-10-22 DIAGNOSIS — F332 Major depressive disorder, recurrent severe without psychotic features: Secondary | ICD-10-CM

## 2014-10-22 DIAGNOSIS — F329 Major depressive disorder, single episode, unspecified: Secondary | ICD-10-CM | POA: Diagnosis not present

## 2014-10-22 NOTE — Progress Notes (Signed)
Pt reported to [facility] for Repetitive Transcranial Magnetic Stimulation treatment for Major Depressive Disorder. Pt reported to tx session early this morning (7:30 am) accompanied by her husband. Pt reported no change in medication regimen, alcohol/substance use, caffeine consumption, sleep pattern or metal implant status since previous tx. Pt expressed she is anxious about her dental procedure this morning. Pt reported she took Advil last night for her procedure. Pt mentioned she was up since 5:30 this morning because she was afraid of oversleeping. Pt used noise-canceling headphones (ear bud type) to listen to music during tx. Power remained at 120% for the duration of tx. Pt had no complaints post-tx. Pt and her husband departed from clinic without issue.

## 2014-10-23 ENCOUNTER — Other Ambulatory Visit (INDEPENDENT_AMBULATORY_CARE_PROVIDER_SITE_OTHER): Payer: BLUE CROSS/BLUE SHIELD | Admitting: Emergency Medicine

## 2014-10-23 VITALS — BP 133/77 | HR 70

## 2014-10-23 DIAGNOSIS — F332 Major depressive disorder, recurrent severe without psychotic features: Secondary | ICD-10-CM

## 2014-10-23 DIAGNOSIS — F329 Major depressive disorder, single episode, unspecified: Secondary | ICD-10-CM | POA: Diagnosis not present

## 2014-10-23 NOTE — Progress Notes (Signed)
Pt reported to [facility] for Repetitive Transcranial Magnetic Stimulation treatment for Major Depressive Disorder. Pt reported to tx with pleasant affect. Pt reported no change in medication regimen, alcohol/substance use, caffeine consumption, sleep pattern or metal implant status since previous tx. Pt expressed her dental procedure went very well. Pt mentioned she will be going to get a jaw massage to calm the nerves from the dental procedure.  Pt used noise-canceling headphones (ear bud type) to listen to music during tx. Power remained at 120% for the duration of tx. Pt had no complaints post-tx. Pt and her husband departed from clinic without issue.

## 2014-10-24 ENCOUNTER — Other Ambulatory Visit (INDEPENDENT_AMBULATORY_CARE_PROVIDER_SITE_OTHER): Payer: BLUE CROSS/BLUE SHIELD | Admitting: Emergency Medicine

## 2014-10-24 VITALS — BP 114/74 | HR 70

## 2014-10-24 DIAGNOSIS — F329 Major depressive disorder, single episode, unspecified: Secondary | ICD-10-CM | POA: Diagnosis not present

## 2014-10-24 DIAGNOSIS — F332 Major depressive disorder, recurrent severe without psychotic features: Secondary | ICD-10-CM

## 2014-10-24 NOTE — Progress Notes (Signed)
Pt reported to [facility] for Repetitive Transcranial Magnetic Stimulation treatment for Major Depressive Disorder. Pt reported no change in medication regimen, alcohol/substance use, caffeine consumption, sleep pattern or metal implant status since previous tx. Pt reported to tx with pleasant affect. Pt completed a PHQ-9 with a score of 9 (mild depression). This is decreased 1 pt from his score of 10 the previous week.Pt mentioned that her family and friends have been seeing a positive change in her.  Pt used noise-canceling headphones (ear bud type) to listen to music during tx. Power remained at 120% for the duration of tx. Pt with no complaints post-tx. Pt departed from clinic without issue.

## 2014-10-25 ENCOUNTER — Other Ambulatory Visit (INDEPENDENT_AMBULATORY_CARE_PROVIDER_SITE_OTHER): Payer: BLUE CROSS/BLUE SHIELD | Admitting: Emergency Medicine

## 2014-10-25 VITALS — BP 116/73 | HR 65

## 2014-10-25 DIAGNOSIS — F332 Major depressive disorder, recurrent severe without psychotic features: Secondary | ICD-10-CM | POA: Diagnosis not present

## 2014-10-25 DIAGNOSIS — F329 Major depressive disorder, single episode, unspecified: Secondary | ICD-10-CM | POA: Diagnosis not present

## 2014-10-25 NOTE — Progress Notes (Signed)
       Pt reported to [facility] for Repetitive Transcranial Magnetic Stimulation treatment for Major Depressive Disorder. Pt reported to tx with pleasant affect. Pt reported no change in medication regimen, alcohol/substance use, caffeine consumption, sleep pattern or metal implant status since previous tx.  Pt used noise-canceling headphones (ear bud type) to listen to music during tx. Power remained at 120% for the duration of tx. Pt had no complaints post-tx. Pt and her husband departed from clinic without issue.

## 2014-10-28 ENCOUNTER — Other Ambulatory Visit (INDEPENDENT_AMBULATORY_CARE_PROVIDER_SITE_OTHER): Payer: BLUE CROSS/BLUE SHIELD | Admitting: Emergency Medicine

## 2014-10-28 VITALS — BP 121/78 | HR 80

## 2014-10-28 DIAGNOSIS — F329 Major depressive disorder, single episode, unspecified: Secondary | ICD-10-CM | POA: Diagnosis not present

## 2014-10-28 DIAGNOSIS — F332 Major depressive disorder, recurrent severe without psychotic features: Secondary | ICD-10-CM | POA: Diagnosis not present

## 2014-10-28 NOTE — Progress Notes (Signed)
     Pt reported to [facility] for Repetitive Transcranial Magnetic Stimulation treatment for Major Depressive Disorder. Pt reported to tx session with pleasant affect. Pt reported no change in medication regimen, alcohol/substance use, caffeine consumption, sleep pattern or metal implant status since previous tx. Pt used noise-canceling headphones (ear bud type) to listen to music during tx. Power remained at 120% for the duration of tx. Pt with no complaints post-tx. Pt departed from clinic without issue.

## 2014-10-29 ENCOUNTER — Other Ambulatory Visit (INDEPENDENT_AMBULATORY_CARE_PROVIDER_SITE_OTHER): Payer: BLUE CROSS/BLUE SHIELD | Admitting: Emergency Medicine

## 2014-10-29 VITALS — BP 122/79 | HR 65

## 2014-10-29 DIAGNOSIS — F332 Major depressive disorder, recurrent severe without psychotic features: Secondary | ICD-10-CM | POA: Diagnosis not present

## 2014-10-29 DIAGNOSIS — F329 Major depressive disorder, single episode, unspecified: Secondary | ICD-10-CM | POA: Diagnosis not present

## 2014-10-29 NOTE — Progress Notes (Signed)
Pt reported to [facility] for Repetitive Transcranial Magnetic Stimulation treatment for Major Depressive Disorder. Pt reported to tx session with pleasant affect. Pt came early this morning because she has a doctors appointment later this afternoon. Pt reported no change in medication regimen, alcohol/substance use, caffeine consumption, sleep pattern or metal implant status since previous tx. Yesterday afternoon, pt mentioned about going to the beach this weekend with her husband. Pt was excited about the weekend gateway. Pt reported that her husband has to work this weekend. Pt and husband plan on rescheduling their trip. Pt used noise canceling headphones (ear bud type) to listen to music during tx. Power remained at 120% for the duration of tx. Pt with no complaints post-tx. Pt departed from clinic without issue.

## 2014-10-30 ENCOUNTER — Other Ambulatory Visit (INDEPENDENT_AMBULATORY_CARE_PROVIDER_SITE_OTHER): Payer: BLUE CROSS/BLUE SHIELD | Admitting: Emergency Medicine

## 2014-10-30 VITALS — BP 116/74 | HR 60

## 2014-10-30 DIAGNOSIS — F332 Major depressive disorder, recurrent severe without psychotic features: Secondary | ICD-10-CM

## 2014-10-30 DIAGNOSIS — F329 Major depressive disorder, single episode, unspecified: Secondary | ICD-10-CM | POA: Diagnosis not present

## 2014-10-30 NOTE — Progress Notes (Signed)
Pt reported to [facility] for Repetitive Transcranial Magnetic Stimulation treatment for Major Depressive Disorder. Pt presented with pleasant affect. Pt reported no change in medication, alcohol/substance use, caffeine consumption, sleep pattern, or metal implant status since previous tx. Pt brought noise-canceling headphones to tx. Pt completed a PHQ-9 with a score of 8. This was a 1 point decrease to last weeks score of 9. Pt tolerated tx well. Power remained at 120% for the duration of tx.Pt with no complaints post-tx. Pt departed from clinic without issue

## 2014-10-31 ENCOUNTER — Other Ambulatory Visit (HOSPITAL_COMMUNITY): Payer: BLUE CROSS/BLUE SHIELD | Admitting: Emergency Medicine

## 2014-10-31 VITALS — BP 117/66 | HR 86

## 2014-10-31 DIAGNOSIS — F332 Major depressive disorder, recurrent severe without psychotic features: Secondary | ICD-10-CM

## 2014-11-04 ENCOUNTER — Other Ambulatory Visit (HOSPITAL_COMMUNITY): Payer: BLUE CROSS/BLUE SHIELD | Attending: Psychiatry | Admitting: Emergency Medicine

## 2014-11-04 VITALS — BP 117/67 | HR 68

## 2014-11-04 DIAGNOSIS — F329 Major depressive disorder, single episode, unspecified: Secondary | ICD-10-CM | POA: Insufficient documentation

## 2014-11-04 DIAGNOSIS — F332 Major depressive disorder, recurrent severe without psychotic features: Secondary | ICD-10-CM | POA: Diagnosis not present

## 2014-11-04 NOTE — Progress Notes (Signed)
  Pt reported to [facility] for Repetitive Transcranial Magnetic Stimulation treatment for Major Depressive Disorder. Pt presented with flat affect. Pt reported no change in alcohol/substance use, caffeine consumption, sleep pattern, or metal implant status since previous tx. However, there was a change in medication. Pt's new psychiatrist, Dr. Clovis Pu, prescribed pt with 30 mg of Cymbalta. Dr. Clovis Pu prescribed this medication because he wanted her to maintain a positive response after TMS taper down. Pt took medication 3 times during the weekend - pt stated she was experiencing depressive symptoms (i.e., did not want to get out of bed, did not want to take a walk with her husband, did not want to take a shower). MD was notified. Pt will be signing release form for [place] to speak with Dr. Clovis Pu. Additional sessions may be purchased after speaking with MD. Pt brought noise-canceling headphones to tx.  Pt tolerated tx well. Power remained at 120% for the duration of tx.Pt with no complaints post-tx. Pt departed from clinic without issues.

## 2014-11-06 ENCOUNTER — Other Ambulatory Visit (INDEPENDENT_AMBULATORY_CARE_PROVIDER_SITE_OTHER): Payer: BLUE CROSS/BLUE SHIELD | Admitting: Emergency Medicine

## 2014-11-06 VITALS — BP 123/68 | HR 60

## 2014-11-06 DIAGNOSIS — F329 Major depressive disorder, single episode, unspecified: Secondary | ICD-10-CM | POA: Diagnosis not present

## 2014-11-06 DIAGNOSIS — F332 Major depressive disorder, recurrent severe without psychotic features: Secondary | ICD-10-CM | POA: Diagnosis not present

## 2014-11-06 NOTE — Progress Notes (Signed)
Pt reported to [facility] for Repetitive Transcranial Magnetic Stimulation treatment for Major Depressive Disorder. Pt presented with pleasant affect. Pt reported no change in medication, alcohol/substance use, caffeine consumption, sleep pattern, or metal implant status since previous tx.Due to pt experiencing a set back from medication modification from last week, additional 15 sessions were ordered. Pt was pleasant when approval was made by insurance.  Pt brought noise-canceling headphones to tx. Pt tolerated tx well. Power remained at 120% for the duration of tx.Pt with no complaints post-tx. Pt departed from clinic without issue

## 2014-11-07 ENCOUNTER — Other Ambulatory Visit (HOSPITAL_COMMUNITY): Payer: BLUE CROSS/BLUE SHIELD

## 2014-11-08 ENCOUNTER — Other Ambulatory Visit (INDEPENDENT_AMBULATORY_CARE_PROVIDER_SITE_OTHER): Payer: BLUE CROSS/BLUE SHIELD | Admitting: Emergency Medicine

## 2014-11-08 VITALS — BP 123/78 | HR 80

## 2014-11-08 DIAGNOSIS — F332 Major depressive disorder, recurrent severe without psychotic features: Secondary | ICD-10-CM | POA: Diagnosis not present

## 2014-11-08 DIAGNOSIS — F329 Major depressive disorder, single episode, unspecified: Secondary | ICD-10-CM | POA: Diagnosis not present

## 2014-11-08 NOTE — Progress Notes (Signed)
Pt reported to [facility] for Repetitive Transcranial Magnetic Stimulation treatment for Major Depressive Disorder. Pt presented with pleasant affect. Pt reported no change in medication, alcohol/substance use, caffeine consumption, sleep pattern, or metal implant status since previous tx. Pt brought noise-canceling headphones to tx. Pt completed a PHQ-9 with a score of 12. This was a 4 point increase to last weeks score of 8. Pt stated she has been having a rough week from the medication  modification. Pt tolerated tx well. Power remained at 120% for the duration of tx.Pt with no complaints post-tx. Pt departed from clinic without issue

## 2014-11-11 ENCOUNTER — Encounter (HOSPITAL_COMMUNITY): Payer: Self-pay

## 2014-11-12 ENCOUNTER — Other Ambulatory Visit (HOSPITAL_COMMUNITY): Payer: BLUE CROSS/BLUE SHIELD

## 2014-11-12 VITALS — BP 125/72 | HR 93

## 2014-11-12 DIAGNOSIS — F332 Major depressive disorder, recurrent severe without psychotic features: Secondary | ICD-10-CM

## 2014-11-12 NOTE — Progress Notes (Unsigned)
Pt reported to [facility] for Repetitive Transcranial Magnetic Stimulation treatment for Major Depressive Disorder. Pt presented with pleasant affect. Pt reported no change in medication, alcohol/substance use, caffeine consumption, sleep pattern, or metal implant status since previous tx. Pt brought noise-canceling headphones to tx. Pt tolerated tx well. Power remained at 120% for the duration of tx.Pt with no complaints post-tx. Pt departed from clinic without issue

## 2014-11-13 ENCOUNTER — Encounter (HOSPITAL_COMMUNITY): Payer: Self-pay

## 2014-11-14 ENCOUNTER — Other Ambulatory Visit (INDEPENDENT_AMBULATORY_CARE_PROVIDER_SITE_OTHER): Payer: BLUE CROSS/BLUE SHIELD | Admitting: Emergency Medicine

## 2014-11-14 VITALS — BP 107/72 | HR 98

## 2014-11-14 DIAGNOSIS — F332 Major depressive disorder, recurrent severe without psychotic features: Secondary | ICD-10-CM | POA: Diagnosis not present

## 2014-11-14 DIAGNOSIS — F329 Major depressive disorder, single episode, unspecified: Secondary | ICD-10-CM | POA: Diagnosis not present

## 2014-11-14 NOTE — Progress Notes (Signed)
Pt reported to [facility] for Repetitive Transcranial Magnetic Stimulation treatment for Major Depressive Disorder. Pt presented with pleasant affect. Pt reported no change in medication, alcohol/substance use, caffeine consumption, sleep pattern, or metal implant status since previous tx. Pt brought noise-canceling headphones to tx. Pt tolerated tx well. Power remained at 120% for the duration of tx.Pt with no complaints post-tx. Pt departed from clinic without issue

## 2014-11-15 ENCOUNTER — Other Ambulatory Visit (HOSPITAL_COMMUNITY): Payer: BLUE CROSS/BLUE SHIELD

## 2014-11-15 ENCOUNTER — Other Ambulatory Visit (INDEPENDENT_AMBULATORY_CARE_PROVIDER_SITE_OTHER): Payer: BLUE CROSS/BLUE SHIELD | Admitting: Emergency Medicine

## 2014-11-15 VITALS — BP 109/72 | HR 60

## 2014-11-15 DIAGNOSIS — F332 Major depressive disorder, recurrent severe without psychotic features: Secondary | ICD-10-CM | POA: Diagnosis not present

## 2014-11-15 DIAGNOSIS — F329 Major depressive disorder, single episode, unspecified: Secondary | ICD-10-CM | POA: Diagnosis not present

## 2014-11-15 NOTE — Progress Notes (Signed)
Pt reported to [facility] for Repetitive Transcranial Magnetic Stimulation treatment for Major Depressive Disorder. Pt presented with pleasant affect. Pt reported no change in medication, alcohol/substance use, caffeine consumption, sleep pattern, or metal implant status since previous tx. Pt brought noise-canceling headphones to tx. Pt tolerated tx well. Power remained at 120% for the duration of tx.Pt with no complaints post-tx. Pt departed from clinic without issue

## 2014-11-18 ENCOUNTER — Other Ambulatory Visit (INDEPENDENT_AMBULATORY_CARE_PROVIDER_SITE_OTHER): Payer: BLUE CROSS/BLUE SHIELD | Admitting: Emergency Medicine

## 2014-11-18 VITALS — BP 123/71 | HR 64

## 2014-11-18 DIAGNOSIS — F332 Major depressive disorder, recurrent severe without psychotic features: Secondary | ICD-10-CM

## 2014-11-18 DIAGNOSIS — F329 Major depressive disorder, single episode, unspecified: Secondary | ICD-10-CM | POA: Diagnosis not present

## 2014-11-18 NOTE — Progress Notes (Signed)
Pt reported to [facility] for Repetitive Transcranial Magnetic Stimulation treatment for Major Depressive Disorder. Pt presented with pleasant affect. Pt reported no change in medication, alcohol/substance use, caffeine consumption, sleep pattern, or metal implant status since previous tx. Pt stated she had a good weekend - her and her husband went house shopping and fell in love with a home. Pt stated they potentially maybe moving. Pt seemed excited. Pt brought noise-canceling headphones to tx. Pt tolerated tx well. Power remained at 120% for the duration of tx.Pt with no complaints post-tx. Pt departed from clinic without issue

## 2014-11-18 NOTE — Addendum Note (Signed)
Addended by: Dionne Bucy on: 11/18/2014 03:53 PM   Modules accepted: Medications

## 2014-11-19 ENCOUNTER — Other Ambulatory Visit (INDEPENDENT_AMBULATORY_CARE_PROVIDER_SITE_OTHER): Payer: BLUE CROSS/BLUE SHIELD | Admitting: Emergency Medicine

## 2014-11-19 VITALS — BP 106/63 | HR 60

## 2014-11-19 DIAGNOSIS — F332 Major depressive disorder, recurrent severe without psychotic features: Secondary | ICD-10-CM

## 2014-11-19 DIAGNOSIS — F329 Major depressive disorder, single episode, unspecified: Secondary | ICD-10-CM | POA: Diagnosis not present

## 2014-11-19 NOTE — Progress Notes (Signed)
Pt reported to [facility] for Repetitive Transcranial Magnetic Stimulation treatment for Major Depressive Disorder. Pt presented withpleasant affect. Pt reported no change in medications, alcohol/substance use, caffeine consumption, sleep pattern, or metal implant status since previous tx. Pt brought noise canceling headphones - listened to music. Pt tolerated tx well. Power remained at 120% for the duration of tx.Pt with no complaints post-tx.

## 2014-11-20 ENCOUNTER — Other Ambulatory Visit (INDEPENDENT_AMBULATORY_CARE_PROVIDER_SITE_OTHER): Payer: BLUE CROSS/BLUE SHIELD | Admitting: Emergency Medicine

## 2014-11-20 VITALS — BP 111/64 | HR 98

## 2014-11-20 DIAGNOSIS — F329 Major depressive disorder, single episode, unspecified: Secondary | ICD-10-CM | POA: Diagnosis not present

## 2014-11-20 DIAGNOSIS — F332 Major depressive disorder, recurrent severe without psychotic features: Secondary | ICD-10-CM | POA: Diagnosis not present

## 2014-11-20 NOTE — Progress Notes (Signed)
Pt reported to [facility] for Repetitive Transcranial Magnetic Stimulation treatment for Major Depressive Disorder. Pt presented with pleasant affect. Pt reported no change in medication, alcohol/substance use, caffeine consumption, sleep pattern, or metal implant status since previous tx. Pt brought noise-canceling headphones to tx. Pt completed a PHQ-9 with a score of 4. This was a 8 point decrease to last weeks score of 12. Pt has been doing extremely better after medication set back. Pt tolerated tx well. Power remained at 120% for the duration of tx.Pt with no complaints post-tx. Pt departed from clinic without issue

## 2014-11-21 ENCOUNTER — Other Ambulatory Visit (INDEPENDENT_AMBULATORY_CARE_PROVIDER_SITE_OTHER): Payer: BLUE CROSS/BLUE SHIELD | Admitting: Emergency Medicine

## 2014-11-21 DIAGNOSIS — F332 Major depressive disorder, recurrent severe without psychotic features: Secondary | ICD-10-CM

## 2014-11-21 DIAGNOSIS — F329 Major depressive disorder, single episode, unspecified: Secondary | ICD-10-CM | POA: Diagnosis not present

## 2014-11-21 NOTE — Progress Notes (Signed)
Pt reported to [facility] for Repetitive Transcranial Magnetic Stimulation treatment for Major Depressive Disorder. Pt presented withpleasant affect. Pt reported no change in medications, alcohol/substance use, caffeine consumption, sleep pattern, or metal implant status since previous tx. Pt brought noise canceling headphones - listened to music. Pt tolerated tx well. Power remained at 120% for the duration of tx.Pt with no complaints post-tx.

## 2014-11-22 ENCOUNTER — Other Ambulatory Visit (INDEPENDENT_AMBULATORY_CARE_PROVIDER_SITE_OTHER): Payer: BLUE CROSS/BLUE SHIELD | Admitting: Emergency Medicine

## 2014-11-22 VITALS — BP 104/67 | HR 98

## 2014-11-22 DIAGNOSIS — F332 Major depressive disorder, recurrent severe without psychotic features: Secondary | ICD-10-CM

## 2014-11-22 DIAGNOSIS — F329 Major depressive disorder, single episode, unspecified: Secondary | ICD-10-CM | POA: Diagnosis not present

## 2014-11-22 NOTE — Progress Notes (Signed)
Pt reported to [facility] for Repetitive Transcranial Magnetic Stimulation treatment for Major Depressive Disorder. Pt presented withpleasant affect. Pt reported no change in medications, alcohol/substance use, caffeine consumption, sleep pattern, or metal implant status since previous tx. Pt brought noise canceling headphones - listened to music. Pt tolerated tx well. Power remained at 120% for the duration of tx.Pt with no complaints post-tx.

## 2014-11-25 ENCOUNTER — Other Ambulatory Visit (INDEPENDENT_AMBULATORY_CARE_PROVIDER_SITE_OTHER): Payer: BLUE CROSS/BLUE SHIELD

## 2014-11-25 VITALS — BP 130/84 | HR 67

## 2014-11-25 DIAGNOSIS — F332 Major depressive disorder, recurrent severe without psychotic features: Secondary | ICD-10-CM

## 2014-11-25 DIAGNOSIS — F329 Major depressive disorder, single episode, unspecified: Secondary | ICD-10-CM | POA: Diagnosis not present

## 2014-11-25 NOTE — Progress Notes (Signed)
Patient ID: Lorraine May, female   DOB: 1955/10/26, 59 y.o.   MRN: [id number] Patient reported to [facility] for Repetitive Transcranial Magnetic Stimulation treatment for Major Depressive Disorder. Patient presented withpleasant affect and reported less depressive symptoms. Patient reported no change in medications, alcohol/substance use, caffeine consumption, sleep pattern, or metal implant status since previous tx. Patient brought noise canceling headphones - listened to music. Patient tolerated tx without any complaint of pain or discomfort.  Power remained at 120% for the duration of tx.Patient stable and with no complaints post-tx

## 2014-11-26 ENCOUNTER — Other Ambulatory Visit (INDEPENDENT_AMBULATORY_CARE_PROVIDER_SITE_OTHER): Payer: BLUE CROSS/BLUE SHIELD | Admitting: *Deleted

## 2014-11-26 VITALS — BP 127/71 | HR 68

## 2014-11-26 DIAGNOSIS — F329 Major depressive disorder, single episode, unspecified: Secondary | ICD-10-CM | POA: Diagnosis not present

## 2014-11-26 DIAGNOSIS — F332 Major depressive disorder, recurrent severe without psychotic features: Secondary | ICD-10-CM | POA: Diagnosis not present

## 2014-11-26 NOTE — Progress Notes (Signed)
Patient ID: Lorraine May, female   DOB: 01/15/1956, 59 y.o.   MRN: [id number]  Pt reported to [place] [facility] this afternoon forTranscranial Magnetic Stimulation for Major Depressive Disorder. Pt reported no change in metal implant status, sleep pattern, appetite/intake, caffeine consumption or alcohol/substance use. Pt is still taking her current medications as prescribed.  Pt tolerated treatment. %MT remained at 120% for duration of treatment. Pt with no complaints post treatment. PHQ-9 score was a 6 this afternoon. Pt departed from clinic without an issue.

## 2014-11-27 ENCOUNTER — Other Ambulatory Visit (INDEPENDENT_AMBULATORY_CARE_PROVIDER_SITE_OTHER): Payer: BLUE CROSS/BLUE SHIELD | Admitting: *Deleted

## 2014-11-27 VITALS — BP 111/71 | HR 61

## 2014-11-27 DIAGNOSIS — F332 Major depressive disorder, recurrent severe without psychotic features: Secondary | ICD-10-CM | POA: Diagnosis not present

## 2014-11-27 DIAGNOSIS — F329 Major depressive disorder, single episode, unspecified: Secondary | ICD-10-CM | POA: Diagnosis not present

## 2014-11-27 NOTE — Progress Notes (Signed)
Patient ID: Lorraine May, female   DOB: [birth year], 59 y.o.   MRN: [id number]  Pt reported to [place] [facility] this afternoon forTranscranial Magnetic Stimulation for Major Depressive Disorder. Pt reported no change in her metal implant status, sleep pattern, appetite/intake, caffeine consumption or alcohol/substance use. Pt tolerated treatment and had concerns or questions. %MT remained at 120% for duration of treatment. Pt with no complaints post treatment. Pt departed from clinic without any issues.  I agree with assessment and plan Geralyn Flash A. Sabra Heck, M.D.

## 2014-11-28 ENCOUNTER — Other Ambulatory Visit (INDEPENDENT_AMBULATORY_CARE_PROVIDER_SITE_OTHER): Payer: BLUE CROSS/BLUE SHIELD

## 2014-11-28 VITALS — BP 118/78 | HR 78

## 2014-11-28 DIAGNOSIS — F332 Major depressive disorder, recurrent severe without psychotic features: Secondary | ICD-10-CM

## 2014-11-28 DIAGNOSIS — F329 Major depressive disorder, single episode, unspecified: Secondary | ICD-10-CM | POA: Diagnosis not present

## 2014-11-28 NOTE — Progress Notes (Signed)
Patient ID: Lorraine May, female   DOB: [birth year], 59 y.o.   MRN: [id number] Patient reported to [facility] for Repetitive Transcranial Magnetic Stimulation treatment for Major Depressive Disorder. Patient presented with cheerful affect, level mood and denied any problems or symptoms today. Patient reported no change in medications, alcohol/substance use, caffeine consumption, sleep pattern, or metal implant status since previous tx. Patient brought noise canceling headphones - listened to music throughout session. Patient tolerated tx without any complaint of pain or discomfort. Power remained at 120% for the duration of tx.Patient stable and with no complaints post-tx and left facility without any concerns noted.

## 2014-11-29 ENCOUNTER — Other Ambulatory Visit (INDEPENDENT_AMBULATORY_CARE_PROVIDER_SITE_OTHER): Payer: BLUE CROSS/BLUE SHIELD

## 2014-11-29 VITALS — BP 124/78 | HR 65

## 2014-11-29 DIAGNOSIS — F332 Major depressive disorder, recurrent severe without psychotic features: Secondary | ICD-10-CM

## 2014-11-29 DIAGNOSIS — F329 Major depressive disorder, single episode, unspecified: Secondary | ICD-10-CM | POA: Diagnosis not present

## 2014-11-29 NOTE — Progress Notes (Signed)
Patient ID: Lorraine May, female   DOB: [birth year], 59 y.o.   MRN: [id number] Patient reported to [facility] for Repetitive Transcranial Magnetic Stimulation treatment for Major Depressive Disorder. Patient presented with appropriate affect, level mood and denied any current symptoms of depression or other concerns. Patient reported no change in medications, alcohol/substance use, caffeine consumption, sleep pattern, or metal implant status since previous tx. Patient reported plans for a family trip to [place], [place] after session and not looking forward to 5 hour drive. Patient brought noise canceling headphones - listened to music throughout session. Patient tolerated tx without any complaint of pain or discomfort. Power remained at 120% for the duration of tx.Patient stable and with no complaints post-tx as patient left facility without any concerns noted.

## 2014-12-02 ENCOUNTER — Other Ambulatory Visit (INDEPENDENT_AMBULATORY_CARE_PROVIDER_SITE_OTHER): Payer: BLUE CROSS/BLUE SHIELD | Admitting: Emergency Medicine

## 2014-12-02 VITALS — BP 112/68 | HR 98

## 2014-12-02 DIAGNOSIS — F332 Major depressive disorder, recurrent severe without psychotic features: Secondary | ICD-10-CM

## 2014-12-02 DIAGNOSIS — F329 Major depressive disorder, single episode, unspecified: Secondary | ICD-10-CM | POA: Diagnosis not present

## 2014-12-02 NOTE — Progress Notes (Signed)
Pt reported to [facility] for Repetitive Transcranial Magnetic Stimulation treatment for Major Depressive Disorder. Pt presented withpleasant affect. Pt reported no change in medications, alcohol/substance use, caffeine consumption, sleep pattern, or metal implant status since previous tx. Pt brought noise canceling headphones - listened to music. Pt tolerated tx well. Power remained at 120% for the duration of tx.Pt with no complaints post-tx.

## 2014-12-03 ENCOUNTER — Other Ambulatory Visit (HOSPITAL_COMMUNITY): Payer: BLUE CROSS/BLUE SHIELD | Attending: Psychiatry | Admitting: Emergency Medicine

## 2014-12-03 VITALS — BP 121/86 | HR 98

## 2014-12-03 DIAGNOSIS — F332 Major depressive disorder, recurrent severe without psychotic features: Secondary | ICD-10-CM

## 2014-12-03 DIAGNOSIS — F329 Major depressive disorder, single episode, unspecified: Secondary | ICD-10-CM | POA: Insufficient documentation

## 2014-12-03 NOTE — Progress Notes (Signed)
Pt reported to [facility] for Repetitive Transcranial Magnetic Stimulation treatment for Major Depressive Disorder. Pt presented withpleasant affect. Pt reported no change in medications, alcohol/substance use, caffeine consumption, sleep pattern, or metal implant status since previous tx. Pt brought noise canceling headphones - listened to music. Pt tolerated tx well. Power remained at 120% for the duration of tx.Pt with no complaints post-tx.

## 2014-12-04 ENCOUNTER — Other Ambulatory Visit (INDEPENDENT_AMBULATORY_CARE_PROVIDER_SITE_OTHER): Payer: BLUE CROSS/BLUE SHIELD | Admitting: Emergency Medicine

## 2014-12-04 VITALS — BP 112/68 | HR 78

## 2014-12-04 DIAGNOSIS — F332 Major depressive disorder, recurrent severe without psychotic features: Secondary | ICD-10-CM

## 2014-12-04 DIAGNOSIS — F329 Major depressive disorder, single episode, unspecified: Secondary | ICD-10-CM | POA: Diagnosis not present

## 2014-12-04 NOTE — Progress Notes (Signed)
Pt reported to [facility] for Repetitive Transcranial Magnetic Stimulation treatment for Major Depressive Disorder. Pt presented withpleasant affect. Pt reported no change in medications, alcohol/substance use, caffeine consumption, sleep pattern, or metal implant status since previous tx. Pt filled out PHQ-9 questionnaire scoring 4. This was a 2 point decrease from last week score of 6.   Pt brought noise canceling headphones - listened to music. Pt tolerated tx well. Power remained at 120% for the duration of tx.Pt with no complaints post-tx.

## 2014-12-05 ENCOUNTER — Other Ambulatory Visit (HOSPITAL_COMMUNITY): Payer: BLUE CROSS/BLUE SHIELD

## 2014-12-09 ENCOUNTER — Other Ambulatory Visit (INDEPENDENT_AMBULATORY_CARE_PROVIDER_SITE_OTHER): Payer: BLUE CROSS/BLUE SHIELD | Admitting: Emergency Medicine

## 2014-12-09 VITALS — BP 118/79 | HR 80

## 2014-12-09 DIAGNOSIS — F332 Major depressive disorder, recurrent severe without psychotic features: Secondary | ICD-10-CM | POA: Diagnosis not present

## 2014-12-09 DIAGNOSIS — F329 Major depressive disorder, single episode, unspecified: Secondary | ICD-10-CM | POA: Diagnosis not present

## 2014-12-09 NOTE — Progress Notes (Signed)
Pt reported to [facility] for Repetitive Transcranial Magnetic Stimulation treatment for Major Depressive Disorder. Pt presented withpleasant affect. Pt reported no change in medications, alcohol/substance use, caffeine consumption, sleep pattern, or metal implant status since previous tx. Pt brought noise canceling headphones - listened to music. Pt tolerated tx well. Power remained at 120% for the duration of tx.Pt with no complaints post-tx.

## 2014-12-10 ENCOUNTER — Other Ambulatory Visit: Payer: Self-pay

## 2014-12-10 DIAGNOSIS — Z1231 Encounter for screening mammogram for malignant neoplasm of breast: Secondary | ICD-10-CM

## 2014-12-11 ENCOUNTER — Other Ambulatory Visit (INDEPENDENT_AMBULATORY_CARE_PROVIDER_SITE_OTHER): Payer: BLUE CROSS/BLUE SHIELD | Admitting: Emergency Medicine

## 2014-12-11 VITALS — BP 112/70 | HR 68

## 2014-12-11 DIAGNOSIS — F329 Major depressive disorder, single episode, unspecified: Secondary | ICD-10-CM | POA: Diagnosis not present

## 2014-12-11 DIAGNOSIS — F332 Major depressive disorder, recurrent severe without psychotic features: Secondary | ICD-10-CM | POA: Diagnosis not present

## 2014-12-11 NOTE — Progress Notes (Signed)
Pt reported to [facility] for Repetitive Transcranial Magnetic Stimulation treatment for Major Depressive Disorder. Pt presented withpleasant affect. Pt reported no change in medications, alcohol/substance use, caffeine consumption, sleep pattern, or metal implant status since previous tx. Pt brought noise canceling headphones - listened to music. Pt tolerated tx well. Power remained at 120% for the duration of tx.Pt with no complaints post-tx.

## 2014-12-13 ENCOUNTER — Other Ambulatory Visit (INDEPENDENT_AMBULATORY_CARE_PROVIDER_SITE_OTHER): Payer: BLUE CROSS/BLUE SHIELD | Admitting: Emergency Medicine

## 2014-12-13 VITALS — BP 111/73 | HR 68

## 2014-12-13 DIAGNOSIS — F332 Major depressive disorder, recurrent severe without psychotic features: Secondary | ICD-10-CM | POA: Diagnosis not present

## 2014-12-13 DIAGNOSIS — F329 Major depressive disorder, single episode, unspecified: Secondary | ICD-10-CM | POA: Diagnosis not present

## 2014-12-13 NOTE — Progress Notes (Signed)
Pt reported to [facility] for Repetitive Transcranial Magnetic Stimulation treatment for Major Depressive Disorder. Pt presented withpleasant affect. Pt reported no change in medications, alcohol/substance use, caffeine consumption, sleep pattern, or metal implant status since previous tx. Pt expressed concenPt brought noise canceling headphones - listened to music. Pt tolerated tx well. Power remained at 120% for the duration of tx.Pt with no complaints post-tx.

## 2014-12-17 ENCOUNTER — Other Ambulatory Visit (INDEPENDENT_AMBULATORY_CARE_PROVIDER_SITE_OTHER): Payer: BLUE CROSS/BLUE SHIELD | Admitting: Emergency Medicine

## 2014-12-17 VITALS — BP 120/80 | HR 89

## 2014-12-17 DIAGNOSIS — F332 Major depressive disorder, recurrent severe without psychotic features: Secondary | ICD-10-CM

## 2014-12-17 DIAGNOSIS — F329 Major depressive disorder, single episode, unspecified: Secondary | ICD-10-CM | POA: Diagnosis not present

## 2014-12-17 NOTE — Progress Notes (Signed)
Pt reported to [facility] for Repetitive Transcranial Magnetic Stimulation treatment for Major Depressive Disorder. Pt presented withpleasant affect. Pt reported no change in medications, alcohol/substance use, caffeine consumption, sleep pattern, or metal implant status since previous tx. Pt expressed concenPt brought noise canceling headphones - listened to music. Pt tolerated tx well. Power remained at 120% for the duration of tx.Pt with no complaints post-tx.

## 2014-12-19 ENCOUNTER — Other Ambulatory Visit (INDEPENDENT_AMBULATORY_CARE_PROVIDER_SITE_OTHER): Payer: BLUE CROSS/BLUE SHIELD | Admitting: Emergency Medicine

## 2014-12-19 VITALS — BP 104/65 | HR 98

## 2014-12-19 DIAGNOSIS — F332 Major depressive disorder, recurrent severe without psychotic features: Secondary | ICD-10-CM

## 2014-12-19 DIAGNOSIS — F329 Major depressive disorder, single episode, unspecified: Secondary | ICD-10-CM | POA: Diagnosis not present

## 2014-12-19 NOTE — Progress Notes (Signed)
Pt reported to [facility] for Repetitive Transcranial Magnetic Stimulation treatment for Major Depressive Disorder. Pt presented withpleasant affect. Pt reported no change in medications, alcohol/substance use, caffeine consumption, sleep pattern, or metal implant status since previous tx. Pt expressed concenPt brought noise canceling headphones - listened to music. Pt tolerated tx well. Power remained at 120% for the duration of tx.Pt with no complaints post-tx.

## 2014-12-25 ENCOUNTER — Other Ambulatory Visit (INDEPENDENT_AMBULATORY_CARE_PROVIDER_SITE_OTHER): Payer: BLUE CROSS/BLUE SHIELD | Admitting: Emergency Medicine

## 2014-12-25 VITALS — BP 110/72 | HR 98

## 2014-12-25 DIAGNOSIS — F332 Major depressive disorder, recurrent severe without psychotic features: Secondary | ICD-10-CM

## 2014-12-25 DIAGNOSIS — F329 Major depressive disorder, single episode, unspecified: Secondary | ICD-10-CM | POA: Diagnosis not present

## 2014-12-25 NOTE — Progress Notes (Signed)
Pt reported to [facility] for Repetitive Transcranial Magnetic Stimulation treatment for Major Depressive Disorder. Pt presented withpleasant affect. Pt reported no change in medications, alcohol/substance use, caffeine consumption, sleep pattern, or metal implant status since previous tx. Pt expressed overall this tx has been effective and beneficial. Pt has been pleased with her positive results. Pt filled out PHQ-9 scoring a 3, which was a decrease from a score of 4 the week before. Pt tolerated tx well. Power remained at 120% for the duration of tx.Pt with no complaints post-tx.

## 2015-01-29 ENCOUNTER — Ambulatory Visit
Admission: RE | Admit: 2015-01-29 | Discharge: 2015-01-29 | Disposition: A | Discharge status: Home / Self Care | Payer: BLUE CROSS/BLUE SHIELD | Source: Ambulatory Visit

## 2015-01-29 DIAGNOSIS — Z1231 Encounter for screening mammogram for malignant neoplasm of breast: Secondary | ICD-10-CM

## 2015-01-31 ENCOUNTER — Other Ambulatory Visit: Payer: Self-pay | Admitting: Family Medicine

## 2015-01-31 DIAGNOSIS — R928 Other abnormal and inconclusive findings on diagnostic imaging of breast: Secondary | ICD-10-CM

## 2015-02-05 ENCOUNTER — Ambulatory Visit (HOSPITAL_COMMUNITY): Payer: BLUE CROSS/BLUE SHIELD | Admitting: Psychiatry

## 2015-02-05 DIAGNOSIS — F322 Major depressive disorder, single episode, severe without psychotic features: Secondary | ICD-10-CM

## 2015-02-05 NOTE — Progress Notes (Signed)
Ms Raczka came for follow up evaluation after [place] accompanied by her husband.  Says she did well during the treatment but has slipped back into depression.  Cannot differentiate whether this is the usual depression returning or just situationally a response to the holidays, stress with her family, her brother getting a DUI and a lump being detected in her breast.  Medications are being adjusted by Dr Clovis Pu as well as continuing to work with her therapist. The other big stress is that her insurance has refused to pay the $18,000 bill for the [place] and the hospital is sending her overdue notices..  She also has not been sleeping well at all.  Mental Status today revealed an alert oriented woman who was appropriately dressed and groomed.  She was anxious and reported anxiety and depression without suicidal thoughts or psychosis.  We discussed the expected response from [place] whether further treatment would be helpful.  Recommended therapy is likely most useful at the moment.  She met with the billing person here and the effort is ongoing to resolve the billing problems.  No further follow up needed here. She will follow up with Dr Clovis Pu and her therapist.

## 2015-02-07 ENCOUNTER — Ambulatory Visit
Admission: RE | Admit: 2015-02-07 | Discharge: 2015-02-07 | Disposition: A | Discharge status: Home / Self Care | Payer: BLUE CROSS/BLUE SHIELD | Source: Ambulatory Visit | Attending: Family Medicine | Admitting: Family Medicine

## 2015-02-07 DIAGNOSIS — R928 Other abnormal and inconclusive findings on diagnostic imaging of breast: Secondary | ICD-10-CM

## 2015-04-25 ENCOUNTER — Ambulatory Visit (INDEPENDENT_AMBULATORY_CARE_PROVIDER_SITE_OTHER): Payer: BLUE CROSS/BLUE SHIELD | Admitting: Neurology

## 2015-04-25 ENCOUNTER — Encounter: Payer: Self-pay | Admitting: Neurology

## 2015-04-25 ENCOUNTER — Other Ambulatory Visit (INDEPENDENT_AMBULATORY_CARE_PROVIDER_SITE_OTHER): Payer: BLUE CROSS/BLUE SHIELD

## 2015-04-25 VITALS — BP 118/80 | HR 71 | Wt 164.1 lb

## 2015-04-25 DIAGNOSIS — R4189 Other symptoms and signs involving cognitive functions and awareness: Secondary | ICD-10-CM | POA: Diagnosis not present

## 2015-04-25 DIAGNOSIS — F332 Major depressive disorder, recurrent severe without psychotic features: Secondary | ICD-10-CM

## 2015-04-25 LAB — T4, FREE: Free T4: 0.95 ng/dL (ref 0.60–1.60)

## 2015-04-25 LAB — TSH: TSH: 1.48 u[IU]/mL (ref 0.35–4.50)

## 2015-04-25 NOTE — Patient Instructions (Signed)
1.  We will check TSH and free T4 2.  We will refer you for neuropsychological testing 3.  Follow up after testing

## 2015-04-25 NOTE — Progress Notes (Addendum)
NEUROLOGY CONSULTATION NOTE  Lorraine May MRN: [id number] DOB: 03-04-1955  Referring provider: Dr. Inda Merlin Primary care provider: Dr. Inda Merlin  Reason for consult:  Mild cognitive impairment  HISTORY OF PRESENT ILLNESS: Lorraine May is a 60 year old right-handed female with severe major depression who presents for mild cognitive impairment.  History obtained by patient and PCP note.  Labs and imaging of brain MRI reviewed.  [name] has history of major depression.  She has PTSD following a traumatic childhood.  She was diagnosed and treated for colon cancer in 2006.  Over the past few years, her mother was ill and in a nursing home.  She passed away in January 20, 2014.  She was also diagnosed recently with CKD stage 3.  Last year, she was hospitalized for suicidal ideation.  She has been on multiple antidepressants and psychotropic medications over the years.  She has been on Ambien for 10 years.  She takes a small dose of Xanax.  Over the past 3 years, she has had problems with recall.  She often has word-finding difficulty.  Sometimes, she says a completely different word in a sentence.  She will trail off and stop speaking mid-sentence.  She has made these mistakes when writing as well as speaking.  She denies difficulty reading or with receptive language.  She has had increased problems with reviewing her bank statements, although she is till able to pay her bills, although she will sometimes have her husband look them over.  She reports difficulty reading a clock.  Sometimes, she will take a wrong turn on familiar routes but will be able to reorient herself.  She has trouble remembering details of book chapters that she just read.  She denies difficulty with performing ADLs, or remembering names or faces of people she knows.  There was a house that she and her husband were looking at buying.  She had visited the house 6 times but kept getting lost inside.  She graduated valedictorian.  She was [employer] in college.  She worked as a Engineer, maintenance (IT).  There is no known family history of dementia, however her parents passed away early.  Her father passed away at 24 due to alcoholism.  Her paternal grandmother passed away due to alcoholism in her early 50s.  Her mother passed away at 79 but she was diagnosed with dementia late in life after several strokes.  Her psychiatrist diagnosed her with mild cognitive impairment and started her on Cerefolin NAC.  She discontinued it due to expense.  Her medications include Ambien CR, alprazolam ER 0.5mg , trazodone 25mg , Lamictal 200mg .  MRI of brain from 05/10/14 was normal. Recent labs include B12 467, folate 17.1, vitamin D 50.9.    PAST MEDICAL HISTORY: Past Medical History  Diagnosis Date  . Hx of colon cancer, stage II 08/24/2011    T3N0  adenoca cecum resected 04/2004  Xeloda adjuvant chemo  . Hyperlipidemia 08/24/2011  . Cancer ([place])     colon  . Arthritis   . Anxiety and depression 08/24/2011  . Anxiety   . Depression   . Joint pain   . PONV (postoperative nausea and vomiting)   . Nausea & vomiting     PAST SURGICAL HISTORY: Past Surgical History  Procedure Laterality Date  . Knee arthroscopy    . Hernia repair    . Port-a-cath removal      insertion and removal  . Hemicolectomy    . Abdominal hysterectomy    .  Ectopic pregnancy surgery    . Breast surgery      reduction  . Cholecystectomy  10/05/2011    Procedure: LAPAROSCOPIC CHOLECYSTECTOMY WITH INTRAOPERATIVE CHOLANGIOGRAM;  Surgeon: Stark Klein, MD;  Location: [place];  Service: General;  Laterality: N/A;    MEDICATIONS: Current Outpatient Prescriptions on File Prior to Visit  Medication Sig Dispense Refill  . ALPRAZolam (XANAX XR) 0.5 MG 24 hr tablet Take 0.5 mg by mouth daily.    . cholestyramine (QUESTRAN) 4 G packet Take 1 packet by mouth daily.    . cloNIDine (CATAPRES) 0.1 MG tablet Take 0.1 mg by mouth at bedtime.    . dicyclomine (BENTYL) 20 MG tablet Take 20 mg by  mouth daily as needed for spasms.     . ergocalciferol (VITAMIN D2) 50000 UNITS capsule Take 50,000 Units by mouth once a week. Take on sundays    . ESTRACE VAGINAL 0.1 MG/GM vaginal cream Place 1 Applicatorful vaginally every Monday, Wednesday, and Friday.    . lamoTRIgine (LAMICTAL) 200 MG tablet Take 200 mg by mouth daily.     . simvastatin (ZOCOR) 40 MG tablet Take 40 mg by mouth every evening.    . zolpidem (AMBIEN CR) 12.5 MG CR tablet Take 12.5 mg by mouth at bedtime as needed for sleep.     No current facility-administered medications on file prior to visit.    ALLERGIES: Allergies  Allergen Reactions  . Dilaudid [Hydromorphone Hcl] Itching  . Fentanyl Itching  . Morphine And Related Itching  . Percocet [Oxycodone-Acetaminophen]     itching  . Tetracyclines & Related Nausea Only  . Sulfa Antibiotics Rash    FAMILY HISTORY: Family History  Problem Relation Age of Onset  . Diabetes Mother     type 1  . Stroke Mother   . Dementia Mother   . Alcohol abuse Father   . Cancer Paternal Aunt     germ cell  . Cancer Maternal Grandmother     colon  . Cancer Maternal Uncle     throat    SOCIAL HISTORY: Social History   Social History  . Marital Status: Married    Spouse Name: N/A  . Number of Children: N/A  . Years of Education: N/A   Occupational History  . Not on file.   Social History Main Topics  . Smoking status: Never Smoker   . Smokeless tobacco: Never Used  . Alcohol Use: Yes     Comment: occ  . Drug Use: No  . Sexual Activity: Not on file   Other Topics Concern  . Not on file   Social History Narrative    REVIEW OF SYSTEMS: Constitutional: No fevers, chills, or sweats, no generalized fatigue, change in appetite Eyes: No visual changes, double vision, eye pain Ear, nose and throat: No hearing loss, ear pain, nasal congestion, sore throat Cardiovascular: No chest pain, palpitations Respiratory:  No shortness of breath at rest or with exertion,  wheezes GastrointestinaI: No nausea, vomiting, diarrhea, abdominal pain, fecal incontinence Genitourinary:  No dysuria, urinary retention or frequency Musculoskeletal:  No neck pain, back pain Integumentary: No rash, pruritus, skin lesions Neurological: as above Psychiatric: depression, insomnia, anxiety Endocrine: No palpitations, fatigue, diaphoresis, mood swings, change in appetite, change in weight, increased thirst Hematologic/Lymphatic:  No anemia, purpura, petechiae. Allergic/Immunologic: no itchy/runny eyes, nasal congestion, recent allergic reactions, rashes  PHYSICAL EXAM: Filed Vitals:   04/25/15 1012  BP: 118/80  Pulse: 71   General: No acute distress.  Patient appears  well-groomed.  Head:  Normocephalic/atraumatic Eyes:  fundi examined but not visualized Neck: supple, no paraspinal tenderness, full range of motion Back: No paraspinal tenderness Heart: regular rate and rhythm Lungs: Clear to auscultation bilaterally. Vascular: No carotid bruits. Neurological Exam: Mental status: alert and oriented to person, place, and time, delayed recall poor, remote memory intact, fund of knowledge intact, attention and concentration intact, speech fluent and not dysarthric, language intact except poor naming fluency Montreal Cognitive Assessment  04/25/2015  Visuospatial/ Executive (0/5) 5  Naming (0/3) 3  Attention: Read list of digits (0/2) 2  Attention: Read list of letters (0/1) 1  Attention: Serial 7 subtraction starting at 100 (0/3) 3  Language: Repeat phrase (0/2) 2  Language : Fluency (0/1) 0  Abstraction (0/2) 2  Delayed Recall (0/5) 1  Orientation (0/6) 6  Total 25  Adjusted Score (based on education) 25   Cranial nerves: CN I: not tested CN II: pupils equal, round and reactive to light, blind in right eye, fundi unremarkable, without vessel changes, exudates, hemorrhages or papilledema. CN III, IV, VI:  Esotropia of right eye on primary gaze,  full range of motion  with saccadic movements on tracking, no nystagmus, no ptosis CN V: facial sensation intact CN VII: upper and lower face symmetric CN VIII: hearing intact CN IX, X: gag intact, uvula midline CN XI: sternocleidomastoid and trapezius muscles intact CN XII: tongue midline Bulk & Tone: normal, no fasciculations. Motor:  5/5 throughout  Sensation: temperature and vibration sensation intact. Deep Tendon Reflexes:  3+ throughout, toes downgoing.  Finger to nose testing:  Without dysmetria.  Heel to shin:  Without dysmetria.  Gait:  Normal station and stride.  Able to turn, some trouble with tandem walk. Romberg negative.  IMPRESSION: Cognitive difficulties.  Mild cognitive impairment possible, but difficult to make diagnosis given her significant psychiatric history.  PLAN: 1.  We will refer her for formal neuropsychological testing. 2.  Due to the costs, I don't think she needs to continue Cerefolin NAC. 3.  Follow up after testing  Thank you for allowing me to take part in the care of this patient.  Metta Clines, DO  CC:  Darcus Austin, MD

## 2015-04-25 NOTE — Progress Notes (Signed)
Chart forwarded.  

## 2015-04-28 ENCOUNTER — Telehealth: Payer: Self-pay

## 2015-04-28 NOTE — Telephone Encounter (Signed)
Released via mychart

## 2015-04-28 NOTE — Telephone Encounter (Signed)
-----   Message from Pieter Partridge, DO sent at 04/28/2015  6:52 AM EDT ----- Thyroid looks okay

## 2015-05-06 DIAGNOSIS — K580 Irritable bowel syndrome with diarrhea: Secondary | ICD-10-CM | POA: Diagnosis not present

## 2015-05-06 DIAGNOSIS — F4310 Post-traumatic stress disorder, unspecified: Secondary | ICD-10-CM | POA: Diagnosis not present

## 2015-05-06 DIAGNOSIS — F606 Avoidant personality disorder: Secondary | ICD-10-CM | POA: Diagnosis not present

## 2015-05-06 DIAGNOSIS — K915 Postcholecystectomy syndrome: Secondary | ICD-10-CM | POA: Diagnosis not present

## 2015-05-06 DIAGNOSIS — F341 Dysthymic disorder: Secondary | ICD-10-CM | POA: Diagnosis not present

## 2015-05-06 DIAGNOSIS — F411 Generalized anxiety disorder: Secondary | ICD-10-CM | POA: Diagnosis not present

## 2015-05-13 DIAGNOSIS — F606 Avoidant personality disorder: Secondary | ICD-10-CM | POA: Diagnosis not present

## 2015-05-13 DIAGNOSIS — F411 Generalized anxiety disorder: Secondary | ICD-10-CM | POA: Diagnosis not present

## 2015-05-13 DIAGNOSIS — F4310 Post-traumatic stress disorder, unspecified: Secondary | ICD-10-CM | POA: Diagnosis not present

## 2015-05-13 DIAGNOSIS — F341 Dysthymic disorder: Secondary | ICD-10-CM | POA: Diagnosis not present

## 2015-05-20 DIAGNOSIS — F606 Avoidant personality disorder: Secondary | ICD-10-CM | POA: Diagnosis not present

## 2015-05-20 DIAGNOSIS — F341 Dysthymic disorder: Secondary | ICD-10-CM | POA: Diagnosis not present

## 2015-05-20 DIAGNOSIS — F411 Generalized anxiety disorder: Secondary | ICD-10-CM | POA: Diagnosis not present

## 2015-05-20 DIAGNOSIS — F4310 Post-traumatic stress disorder, unspecified: Secondary | ICD-10-CM | POA: Diagnosis not present

## 2015-05-21 DIAGNOSIS — R6884 Jaw pain: Secondary | ICD-10-CM | POA: Diagnosis not present

## 2015-05-27 DIAGNOSIS — F411 Generalized anxiety disorder: Secondary | ICD-10-CM | POA: Diagnosis not present

## 2015-05-27 DIAGNOSIS — F4310 Post-traumatic stress disorder, unspecified: Secondary | ICD-10-CM | POA: Diagnosis not present

## 2015-05-27 DIAGNOSIS — F341 Dysthymic disorder: Secondary | ICD-10-CM | POA: Diagnosis not present

## 2015-05-27 DIAGNOSIS — F606 Avoidant personality disorder: Secondary | ICD-10-CM | POA: Diagnosis not present

## 2015-06-03 DIAGNOSIS — F606 Avoidant personality disorder: Secondary | ICD-10-CM | POA: Diagnosis not present

## 2015-06-03 DIAGNOSIS — F411 Generalized anxiety disorder: Secondary | ICD-10-CM | POA: Diagnosis not present

## 2015-06-03 DIAGNOSIS — F4310 Post-traumatic stress disorder, unspecified: Secondary | ICD-10-CM | POA: Diagnosis not present

## 2015-06-03 DIAGNOSIS — F341 Dysthymic disorder: Secondary | ICD-10-CM | POA: Diagnosis not present

## 2015-06-04 DIAGNOSIS — F332 Major depressive disorder, recurrent severe without psychotic features: Secondary | ICD-10-CM | POA: Diagnosis not present

## 2015-06-04 DIAGNOSIS — N183 Chronic kidney disease, stage 3 (moderate): Secondary | ICD-10-CM | POA: Diagnosis not present

## 2015-06-05 ENCOUNTER — Encounter: Payer: Self-pay | Admitting: Psychology

## 2015-06-05 ENCOUNTER — Ambulatory Visit (INDEPENDENT_AMBULATORY_CARE_PROVIDER_SITE_OTHER): Payer: BLUE CROSS/BLUE SHIELD | Admitting: Psychology

## 2015-06-05 DIAGNOSIS — F332 Major depressive disorder, recurrent severe without psychotic features: Secondary | ICD-10-CM

## 2015-06-05 DIAGNOSIS — R51 Headache: Secondary | ICD-10-CM | POA: Diagnosis not present

## 2015-06-05 DIAGNOSIS — R4189 Other symptoms and signs involving cognitive functions and awareness: Secondary | ICD-10-CM

## 2015-06-05 NOTE — Progress Notes (Signed)
NEUROPSYCHOLOGICAL INTERVIEW (CPT: [id number])  Name: Lorraine May Date of Birth: 1955/05/04 Date of Interview: 06/05/2015  Reason for Referral:  Lorraine May is a 59 y.o., right-handed, married female who is referred for neuropsychological evaluation by Dr. Metta Clines of [facility] due to memory concerns.  History of Presenting Problem:   Lorraine May reported gradual onset and progressive worsening of cognitive complaints over the past 2-3 years. Cognitive concerns reported include: word finding, trailing off mid-sentence when speaking, having to think about her route as she is driving, reduced sense of direction when navigating her way through buildings, having to check her calendar quite a few times a day, having to leave reminders for herself. Upon direct questioning, the patient also endorsed mild forgetfulness for recent events (takes more effort to retrieve what she did a few nights ago), making wrong turns while driving (even in familiar locations; doesn't feel like this is due to distraction when driving), and occasional/sporadic confusion (e.g., one time she was unsure how to balance her bank statement and had to ask her husband to help her). She denied forgetfulness for recent conversations.  Lorraine May reported that she feels very uncomfortable secondary to these cognitive symptoms. She noted that her high intelligence and mental abilities (she was valedictorian of her class, worked as a Engineer, maintenance (IT)) have "grounded" her throughout her life, so these changes are particularly concerning to her.   The patient noted that the onset of her cognitive difficulties did possibly coincide with increased psychosocial stress (e.g., when her mother became very ill in 05-14-2013 and eventually passed away). However, she also experiences cognitive symptoms even in times when she is not anxious or stressed.  Currently, the patient is stressed about several medical issues. She reported onset of headaches several  months ago which are occuring daily. (These started after undergoing [place].) She went to her eye doctor to make sure it wasn't an issue with her eyeglasses prescription, and they ended up doing more testing to rule out more concerning problems. Additionally, she has kidney disease which is somewhat stressful to her.   Of note, Lorraine May does have a history of major depression and she has been diagnosed with PTSD related to childhood trauma.  She first started seeing a psychiatrist in 05-15-02. She saw the same psychiatrist for about 10 years, and was treated with Cymbalta and Wellbutrin pretty much that whole time. Then, she was diagnosed and treated for colon cancer in 2004-05-14. This was very stressful for her, and Ambien was started. She also started taking Xanax as needed at that time. Subsequently, she changed psychiatrists and was incorrectly diagnosed with bipolar disorder and put on multiple new psychotropic medications including Geodon. After about 1-2 years, she went to [facility] in [place], [place], for thorough psychiatric assessment and genetic testing. She was told she did NOT have bipolar disorder, and her genetic testing revealed that she does not metabolize many psychotropic medications properly. She began seeing Dr. Clovis Pu and continues to see him for ongoing care. She reported history of one episode of suicidal ideation in 2013/05/14 when her mother was ill and she was very stressed about end-of-life care planning for her mother. She was voluntarily admitted for psychiatric hospitalization. She denied any suicidal ideation since that time. She is currently taking Lamictal, Xanax XR (0.5 mg in the morning only), and Ambien (nightly). She is prescribed trazodone but has stopped taking it. She wonders if the Xanax (despite it being such a low dose) and the  Ambien are affecting her cognitive function. She underwent [place] for depression from August to November 2016. She reported this was effective, and her  depression has been relatively mild since then.  She continues to see a therapist every week (she has seen her for 8 years at [facility]), which she finds helpful. Lorraine May denied any history of substance dependence/treatment.  There is no history of head injury/concussion.   There is no known family history of dementia, although she does not know any medical history of two grandparents, and her father died at age 52 secondary to alcoholism.  Current Functioning: Lorraine May lives with her husband in their own home. 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   The patient continues to manage all instrumental ADLs, including driving (difficulties noted above; has never gotten lost), medication management (reports compliance and use of pillbox), medication of finances (no issues aside from a couple of instances of confusion/memory lapses as described above), and appointment tracking (have to rely on calendar).  Lorraine May described herself as "worried" about her cognitive and medical issues. No imminent risk of self-harm was identified. She reported relatively good sleep with nightly Ambien. She is contemplating reducing/going off Ambien to see if it helps with cognitive function.  She reported increased appetite recently. She denied any hallucinations.  Social History: Born/Raised: [place] Education: bachelor's degree in accounting from [employer] Occupational history: Retired Engineer, maintenance (IT) (retired at age 12) Marital history: Married x2. 69 years with current husband. One child (son), age 52, lives in [place]. Alcohol/Tobacco/Substances: Was a social drinker in the past. Totally gave up alcohol after psych hospitalization. No history of tobacco use. No illicit substances.  Medical History: Past Medical History  Diagnosis Date  . Hx of colon cancer, stage II 08/24/2011    T3N0  adenoca cecum resected 04/2004  Xeloda adjuvant chemo   . Hyperlipidemia 08/24/2011  . Cancer ([place])     colon  . Arthritis   . Anxiety and depression 08/24/2011  . Anxiety   . Depression   . Joint pain   . PONV (postoperative nausea and vomiting)   . Nausea & vomiting     Current Medications:  Outpatient Encounter Prescriptions as of 06/05/2015  Medication Sig  . ALPRAZolam (XANAX XR) 0.5 MG 24 hr tablet Take 0.5 mg by mouth daily.  . cholecalciferol (VITAMIN D) 1000 units tablet Take 1,000 Units by mouth daily.  . cholestyramine (QUESTRAN) 4 G packet Take 1 packet by mouth daily.  . cloNIDine (CATAPRES) 0.1 MG tablet Take 0.1 mg by mouth at bedtime.  . dicyclomine (BENTYL) 20 MG tablet Take 20 mg by mouth daily as needed for spasms.   . ergocalciferol (VITAMIN D2) 50000 UNITS capsule Take 50,000 Units by mouth once a week. Take on sundays  . ESTRACE VAGINAL 0.1 MG/GM vaginal cream Place 1 Applicatorful vaginally every Monday, Wednesday, and Friday.  . lamoTRIgine (LAMICTAL) 200 MG tablet Take 200 mg by mouth daily.   . simvastatin (ZOCOR) 40 MG tablet Take 40 mg by mouth every evening.  . traZODone (DESYREL) 50 MG tablet Take 12.5 mg by mouth at bedtime.  Marland Kitchen zolpidem (AMBIEN CR) 12.5 MG CR tablet Take 12.5 mg by mouth at bedtime as needed for sleep.   No facility-administered encounter medications on file as of 06/05/2015.   The patient reported that she is not currently taking trazodone secondary to disturbing  dreams.   Behavioral Observations:   Appearance: Neat, appropriately dressed and groomed Gait: Ambulated independently, no gait abnormalities observed Speech: Fluent; normal rate, rhythm and volume Thought process: Linear, goal directed Affect: Full, appropriate to situation Task persistence: Good Interpersonal: Appropriate, very pleasant Orientation: Oriented to all spheres   TESTING: There was medical necessity to proceed with neuropsychological assessment as the results will be used to aid in differential diagnosis and  clinical decision-making and to inform specific treatment recommendations. Per the patient and medical records reviewed, there has been a change in cognitive functioning and a reasonable suspicion of mild cognitive impairment or underlying dementia. There is a need for objective assessment of subjective cognitive complaints in order to differentiate organic from psychogenic etiology, given her significant psychiatric history.  Following the clinical interview, the patient completed 2 hours of neuropsychological testing.   Total face to face time spent in clinical interview: 60 minutes (CPT: [id number]) Total face to face time spent administering neuropsychological tests: 120 minutes  PLAN: The patient will be scheduled for a follow-up session with this provider at which time her test performances and my impressions and treatment recommendations will be reviewed in detail.   Full neuropsychological evaluation report to follow.

## 2015-06-09 DIAGNOSIS — R6884 Jaw pain: Secondary | ICD-10-CM | POA: Diagnosis not present

## 2015-06-10 DIAGNOSIS — F4310 Post-traumatic stress disorder, unspecified: Secondary | ICD-10-CM | POA: Diagnosis not present

## 2015-06-10 DIAGNOSIS — F341 Dysthymic disorder: Secondary | ICD-10-CM | POA: Diagnosis not present

## 2015-06-10 DIAGNOSIS — F411 Generalized anxiety disorder: Secondary | ICD-10-CM | POA: Diagnosis not present

## 2015-06-10 DIAGNOSIS — F606 Avoidant personality disorder: Secondary | ICD-10-CM | POA: Diagnosis not present

## 2015-06-11 DIAGNOSIS — N183 Chronic kidney disease, stage 3 (moderate): Secondary | ICD-10-CM | POA: Diagnosis not present

## 2015-06-11 DIAGNOSIS — M545 Low back pain: Secondary | ICD-10-CM | POA: Diagnosis not present

## 2015-06-17 ENCOUNTER — Ambulatory Visit (INDEPENDENT_AMBULATORY_CARE_PROVIDER_SITE_OTHER): Payer: BLUE CROSS/BLUE SHIELD | Admitting: Psychology

## 2015-06-17 DIAGNOSIS — F332 Major depressive disorder, recurrent severe without psychotic features: Secondary | ICD-10-CM | POA: Diagnosis not present

## 2015-06-17 DIAGNOSIS — F4312 Post-traumatic stress disorder, chronic: Secondary | ICD-10-CM | POA: Diagnosis not present

## 2015-06-17 DIAGNOSIS — F341 Dysthymic disorder: Secondary | ICD-10-CM | POA: Diagnosis not present

## 2015-06-17 DIAGNOSIS — R4189 Other symptoms and signs involving cognitive functions and awareness: Secondary | ICD-10-CM | POA: Diagnosis not present

## 2015-06-17 DIAGNOSIS — F411 Generalized anxiety disorder: Secondary | ICD-10-CM | POA: Diagnosis not present

## 2015-06-17 DIAGNOSIS — F4310 Post-traumatic stress disorder, unspecified: Secondary | ICD-10-CM | POA: Diagnosis not present

## 2015-06-17 DIAGNOSIS — F606 Avoidant personality disorder: Secondary | ICD-10-CM | POA: Diagnosis not present

## 2015-06-17 NOTE — Progress Notes (Signed)
NEUROPSYCHOLOGICAL EVALUATION   Name:    Lorraine May  Date of Birth:   [birth year] Date of Evaluation:  06/05/2015        Background Information:  Reason for Referral:  Lorraine May is a 60 y.o., right-handed, married female referred by Dr. Tomi Likens to assess her current level of cognitive functioning and assist in differential diagnosis. The current evaluation consisted of a review of available medical records, an interview with the patient, and the completion of a neuropsychological testing battery. Informed consent was obtained.  History of Presenting Problem:  Lorraine May reported gradual onset and progressive worsening of cognitive complaints over the past 2-3 years. Cognitive concerns reported include: word finding, trailing off mid-sentence when speaking, having to think about her route as she is driving, reduced sense of direction when navigating her way through buildings, having to check her calendar quite a few times a day, having to leave reminders for herself. Upon direct questioning, the patient also endorsed mild forgetfulness for recent events (takes more effort to retrieve what she did a few nights ago), making wrong turns while driving (even in familiar locations; doesn't feel like this is due to distraction when driving), and occasional/sporadic confusion (e.g., one time she was unsure how to balance her bank statement and had to ask her husband to help her). She denied forgetfulness for recent conversations.  Lorraine May reported that she feels very uncomfortable secondary to these cognitive symptoms. She noted that her high intelligence and mental abilities (she was valedictorian of her class, worked as a Engineer, maintenance (IT)) have "grounded" her throughout her life, so these changes are particularly concerning to her.   The patient noted that the onset of her cognitive difficulties did possibly coincide with increased psychosocial stress (e.g., when her mother became very ill in May 03, 2013 and  eventually passed away). However, she also experiences cognitive symptoms even in times when she is not anxious or stressed.  Currently, the patient is stressed about several medical issues. She reported onset of headaches several months ago which are occuring daily. (These started after undergoing [place].) She went to her eye doctor to make sure it wasn't an issue with her eyeglasses prescription, and they ended up doing more testing to rule out more concerning problems. Additionally, she has kidney disease which is somewhat stressful to her.   Of note, Lorraine May does have a history of major depression and she has been diagnosed with PTSD related to childhood trauma.  She first started seeing a psychiatrist in May 04, 2002. She saw the same psychiatrist for about 10 years, and was treated with Cymbalta and Wellbutrin pretty much that whole time. Then, she was diagnosed and treated for colon cancer in 2004/05/03. This was very stressful for her, and Ambien was started. She also started taking Xanax as needed at that time. Subsequently, she changed psychiatrists and was incorrectly diagnosed with bipolar disorder and put on multiple new psychotropic medications including Geodon. After about 1-2 years, she went to [facility] in [place], [place], for thorough psychiatric assessment and genetic testing. She was told she did NOT have bipolar disorder, and her genetic testing revealed that she does not metabolize many psychotropic medications properly. She began seeing Dr. Clovis Pu and continues to see him for ongoing care. She reported history of one episode of suicidal ideation in 2013-05-03 when her mother was ill and she was very stressed about end-of-life care planning for her mother. She was voluntarily admitted for psychiatric hospitalization. She denied any suicidal  ideation since that time. She is currently taking Lamictal, Xanax XR (0.5 mg in the morning only), and Ambien (nightly). She is prescribed trazodone but has stopped  taking it. She wonders if the Xanax (despite it being such a low dose) and the Ambien are affecting her cognitive function. She underwent [place] for depression from August to November 2016. She reported this was effective, and her depression has been relatively mild since then. She continues to see a therapist every week (she has seen her for 8 years at [facility]), which she finds helpful. Lorraine May denied any history of substance dependence/treatment.  There is no history of head injury/concussion.   There is no known family history of dementia, although she does not know any medical history of two grandparents, and her father died at age 44 secondary to alcoholism.  Current Functioning: Lorraine May lives with her husband in their own home. 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   The patient continues to manage all instrumental ADLs, including driving (difficulties noted above; has never gotten lost), medication management (reports compliance and use of pillbox), medication of finances (no issues aside from a couple of instances of confusion/memory lapses as described above), and appointment tracking (have to rely on calendar).  Lorraine May described herself as "worried" about her cognitive and medical issues. No imminent risk of self-harm was identified. She reported relatively good sleep with nightly Ambien. She is contemplating reducing/going off Ambien to see if it helps with cognitive function.  She reported increased appetite recently. She denied any hallucinations.  Social History: Born/Raised: [place] Education: bachelor's degree in accounting from [employer] Occupational history: Retired Engineer, maintenance (IT) (retired at age 10) Marital history: Married x2. 75 years with current husband. One child (son), age 79, lives in [place]. Alcohol/Tobacco/Substances: Was a social drinker in the past. Totally gave up alcohol after psych  hospitalization. No history of tobacco use. No illicit substances.   Medical History:  Past Medical History  Diagnosis Date  . Hx of colon cancer, stage II 08/24/2011    T3N0  adenoca cecum resected 04/2004  Xeloda adjuvant chemo  . Hyperlipidemia 08/24/2011  . Cancer ([place])     colon  . Arthritis   . Anxiety and depression 08/24/2011  . Anxiety   . Depression   . Joint pain   . PONV (postoperative nausea and vomiting)   . Nausea & vomiting     Current medications:  Outpatient Encounter Prescriptions as of 06/17/2015  Medication Sig  . ALPRAZolam (XANAX XR) 0.5 MG 24 hr tablet Take 0.5 mg by mouth daily.  . cholecalciferol (VITAMIN D) 1000 units tablet Take 1,000 Units by mouth daily.  . cholestyramine (QUESTRAN) 4 G packet Take 1 packet by mouth daily.  . cloNIDine (CATAPRES) 0.1 MG tablet Take 0.1 mg by mouth at bedtime.  . dicyclomine (BENTYL) 20 MG tablet Take 20 mg by mouth daily as needed for spasms.   . ergocalciferol (VITAMIN D2) 50000 UNITS capsule Take 50,000 Units by mouth once a week. Take on sundays  . ESTRACE VAGINAL 0.1 MG/GM vaginal cream Place 1 Applicatorful vaginally every Monday, Wednesday, and Friday.  . lamoTRIgine (LAMICTAL) 200 MG tablet Take 200 mg by mouth daily.   . simvastatin (ZOCOR) 40 MG tablet Take 40 mg by mouth every evening.  . traZODone (DESYREL) 50 MG tablet Take 12.5 mg by mouth at bedtime.  Marland Kitchen  zolpidem (AMBIEN CR) 12.5 MG CR tablet Take 12.5 mg by mouth at bedtime as needed for sleep.   No facility-administered encounter medications on file as of 06/17/2015.   The patient reported that she is not currently taking trazodone secondary to disturbing dreams.   Current Examination:  Behavioral Observations:  Appearance: Neat, appropriately dressed and groomed Gait: Ambulated independently, no gait abnormalities observed Speech: Fluent; normal rate, rhythm and volume Thought process: Linear, goal directed Affect: Full, appropriate to  situation Task persistence: Good Interpersonal: Appropriate, very pleasant Orientation: Oriented to all spheres  Tests Administered: . Test of Premorbid Functioning (TOPF) . Wechsler Adult Intelligence Scale-Fourth Edition (WAIS-IV): Similarities, Matrix Reasoning, Arithmetic, Symbol Search, Coding and Digit Span subtests . Wechsler Memory Scale-Fourth Edition (WMS-IV): Logical Memory I, II and Recognition subtests . Repeatable Battery for the Assessment of Neuropsychological Status (RBANS) - Form A: Figure Copy and Figure Recall subtests . Neuropsychological Assessment Battery (NAB) Language Module, Form 1: Naming Subtest . Controlled Oral Word Association Test (COWAT) . Trail Making Test A and B . [place] Verbal Learning Test - 2nd Edition (CVLT-2) . Clock Drawing Test . Beck Depression Inventory-Second Edition (BDI-II) . Personality Assessment Inventory (PAI)  Test Results: Note: Standardized scores are presented only for use by appropriately trained professionals and to allow for any future test-retest comparison. These scores should not be interpreted without consideration of all the information that is contained in the rest of the report. The most recent standardization samples from the test publisher or other sources were used whenever possible to derive standard scores; scores were corrected for age, gender, ethnicity and education when available.   Test Scores:  Test Name Standardized Score Descriptor  TOPF SS=114 High average  WAIS-IV Subtests    Similarities ss=9 Average  Matrix Reasoning ss=11 Average  Arithmetic ss=14 Superior  Digit Span  ss=14 Superior  Coding ss=8 Average  Symbol Search ss=8 Average  WAIS-IV Index Scores    Working Memory Index SS=136 Superior  Processing Speed Index SS=89 Low Average  WMS-IV Subtests    Logical Memory I ss=8 Average  Logical Memory II ss=9 Average  Logical Memory Recognition 75cum%ile WNL  CVLT-II Scores    Trial 1 Z=-1.0  Low average  Trial 5 Z=-1.0 Low average  Trials 1-5 total T=43 Low average  Trial B Z=-1.5 Borderline impaired  SD Free Recall Z=-0.5 Average  SD Cued Recall Z=-0.5 Average  LD Free Recall Z=0 Average  LD Cued Recall Z=0 Average  Recognition Discriminability Z=-0.5 Average  Forced Choice Recognition  WNL (16/16)  RBANS subtests    Figure Copy Z=1.12 High average  Figure Recall Z=0.35 Average  NAB Naming T=55 Average  COWAT-FAS T=54 Average  COWAT-Animals T=64 Superior  Trail Making Test A 1 error T=55 Average  Trail Making Test B 0 errors T=48 Average  Clock Drawing  WNL  BDI-II  Mild (18/63)  PAI    Only clinically elevated scales are shown here    Anxiety Related Disorders T=85   Anxiety T=77   Somatization T=73   Depression T=70    Summary of Data:  Premorbid verbal intellectual abilities were estimated to have been within the high average range based on a test of word reading. Psychomotor processing speed was low average overall. Meanwhile, auditory attention and working memory were superior. There was a statistically significant difference between psychomotor processing speed and working memory on [employer]. Visual-spatial construction was high average. Language abilities were within normal limits. Specifically, confrontation naming was intact and  semantic verbal fluency was superior. With regard to verbal memory, encoding and acquisition of non-contextual information (i.e., word list) was low average. After a brief distracter task, free recall was average. After a delay, free recall remained in the average range, demonstrating good consolidation of previously encoded information. Performance on a yes/no recognition task was average overall. On another verbal memory test, encoding and acquisition of contextual auditory information (i.e., short stories) was average. After a delay, free recall was average. Performance on a yes/no recognition task was intact. With regard to  non-verbal memory, delayed free recall of visual information was average. There was no evidence of executive dysfunction. Mental flexibility and set-shifting were average on Trails [place]. Both verbal and nonverbal abstract reasoning were average. Verbal fluency with phonemic search restrictions was average. Performance on a clock drawing task was intact.  On a self-report questionnaire, the patient's responses were indicative of clinically significant depression in the mild range, characterized by mild pessimism, feelings of failure, anhedonia, guilty feelings, feelings of being punished, restlessness, loss of interest, feelings of worthlessness, lowered energy, increased sleep, increased appetite, concentration difficulty, and fatigue. She reported more moderate self-criticalness, and indecisiveness. She denied suicidal ideation or intention.   On a more extensive measure of psychopathology and personality functioning, the patient's responses suggested that she is experiencing a significant level of anxiety, and that she is likely to display a variety of maladaptive behavior patterns aimed at controlling anxiety. She does not appear to be suffering from specific phobias. However, she is probably seen by others as being somewhat of a perfectionist. She is likely to be a fairly rigid individual who follows her personal guidelines for conduct in an inflexible and unyielding manner. She ruminates about matters to the degree that she often has difficulty making decisions and perceiving the larger significance of decisions that are made. Changes in routine, unexpected events, and contradictory information are likely to generate untoward stress. She may fear her own impulses and doubt her ability to control them. In addition, and perhaps related to the aforementioned problems, the patient has likely experienced a disturbing traumatic event in the past, which continues to distress her and produce recurrent episodes of  anxiety. (This is consistent with self-reported childhood trauma.) Her responses suggest that she is experiencing a discomforting level of anxiety and tension. She is likely to be plagued by worry to the degree that her ability to concentrate and attend are significantly compromised. Those who associate with her are likely to notice her overconcern regarding issues and events over which she has no control. Affectively, she feels a great deal of tension, has difficulty relaxing, and likely experiences fatigue as a result of high perceived stress. In contrast to these cognitive and affective signs of anxiety, the physical signs of tension and stress (such as sweaty palms, trembling hands, complaints of irregular heartbeats, and shortness of breath) do not appear to be a major feature of the clinical picture. Meanwhile, the patient does demonstrate an unusual degree of concern about physical functioning and health matters and probable impairment arising from somatic symptoms. She is likely to report that her daily functioning has been compromised by numerous and varied physical problems. She feels that her health is not as good as that of her same age peers, and she likely believes that her health problems are complex and difficult to treat successfully. She reports particular problems with various minor physical symptoms (such as headaches, pain or gastrointestinal problems) and has vague complaints of ill health and  fatigue. She is likely to be continuously concerned with her health status and physical problems. Her social interactions and conversations tend to focus on her health problems, and her self-image may be largely influenced by a belief that she is handicapped by her poor health. Additionally, the patient reports a number of difficulties associated with a significant depressive experience. The quality of her depression seems primarily marked by cognitive features such as negative expectancies and low  self-esteem. She is likely to be quite pessimistic and plagued by thoughts of worthlessness, hopelessness, and personal failure. Experienced sadness and physiological disturbances, however, appear to play only a minimal to moderate role in the clinical picture. Finally, the patient describes her thought processes as marked by confusion, distractibility, and difficulty concentrating. According to her self-report, she describes NO significant problems with antisocial behavior, problems with empathy, undue suspiciousness or hostility, unusually elevated mood or heightened activity. She reports NO significant problems with alcohol or drug abuse or dependence. With respect to suicidal ideation, the patient does report a history of experiencing periodic and perhaps transient thoughts of self-harm. In clinical interview with the patient, she denied any current thoughts of self-harm.  Clinical Impression: Posttraumatic stress disorder; Major depressive disorder, mild. Results of cognitive testing were not indicative of a cognitive disorder, and all areas of performance fell within normal limits. Meanwhile, the patient reported symptoms of anxiety consistent with PTSD, and she also reported mild symptoms of depression. Overall, results of this evaluation suggest that her cognitive complaints are likely due to psychological distress rather than a developing dementia. It is certainly possible that she is experiencing very mild and normal changes in cognitive related to normal aging, and that her awareness of these changes has increased her anxiety and attention toward them, thereby increasing the frequency of mild attention/memory lapses in daily life. Education regarding the link between anxiety and cognitive functioning will be provided to the patient.  Recommendations: Based on the findings of the present evaluation, the following recommendations are offered:  1. Continue ongoing behavioral health treatment for  depression and anxiety.  2. The patient will be reassured that her cognitive testing results are within normal limits and not indicative of a developing dementia or other cognitive disorder at this time. 3. The current test results will serve as a nice baseline for future comparison, if clinically indicated at any point.   Feedback to Patient: Lorraine May returned for a feedback appointment on 06/17/2015 to review the results of her neuropsychological evaluation with this provider. 30 minutes face-to-face time was spent reviewing her test results, my impressions and my recommendations as detailed above.   A total of six hours (6 units of CPT [phone]) were spent reviewing medical records, administering and scoring neuropsychological tests, interpreting test results, preparing this report and providing results to the patient.    Thank you for your referral of [facility] Crisp. Please feel free to contact me if you have any questions or concerns regarding this report.

## 2015-06-18 DIAGNOSIS — M545 Low back pain: Secondary | ICD-10-CM | POA: Diagnosis not present

## 2015-06-18 DIAGNOSIS — R6884 Jaw pain: Secondary | ICD-10-CM | POA: Diagnosis not present

## 2015-06-24 DIAGNOSIS — F411 Generalized anxiety disorder: Secondary | ICD-10-CM | POA: Diagnosis not present

## 2015-06-24 DIAGNOSIS — F4310 Post-traumatic stress disorder, unspecified: Secondary | ICD-10-CM | POA: Diagnosis not present

## 2015-06-24 DIAGNOSIS — R6884 Jaw pain: Secondary | ICD-10-CM | POA: Diagnosis not present

## 2015-06-24 DIAGNOSIS — M545 Low back pain: Secondary | ICD-10-CM | POA: Diagnosis not present

## 2015-06-24 DIAGNOSIS — F606 Avoidant personality disorder: Secondary | ICD-10-CM | POA: Diagnosis not present

## 2015-06-24 DIAGNOSIS — F341 Dysthymic disorder: Secondary | ICD-10-CM | POA: Diagnosis not present

## 2015-06-25 DIAGNOSIS — M7061 Trochanteric bursitis, right hip: Secondary | ICD-10-CM | POA: Diagnosis not present

## 2015-06-25 DIAGNOSIS — M542 Cervicalgia: Secondary | ICD-10-CM | POA: Diagnosis not present

## 2015-06-25 DIAGNOSIS — M4712 Other spondylosis with myelopathy, cervical region: Secondary | ICD-10-CM | POA: Diagnosis not present

## 2015-06-26 DIAGNOSIS — M542 Cervicalgia: Secondary | ICD-10-CM | POA: Diagnosis not present

## 2015-07-02 DIAGNOSIS — M545 Low back pain: Secondary | ICD-10-CM | POA: Diagnosis not present

## 2015-07-02 DIAGNOSIS — M542 Cervicalgia: Secondary | ICD-10-CM | POA: Diagnosis not present

## 2015-07-03 DIAGNOSIS — Z01419 Encounter for gynecological examination (general) (routine) without abnormal findings: Secondary | ICD-10-CM | POA: Diagnosis not present

## 2015-07-03 DIAGNOSIS — Z13820 Encounter for screening for osteoporosis: Secondary | ICD-10-CM | POA: Diagnosis not present

## 2015-07-03 DIAGNOSIS — Z6827 Body mass index (BMI) 27.0-27.9, adult: Secondary | ICD-10-CM | POA: Diagnosis not present

## 2015-07-08 DIAGNOSIS — F606 Avoidant personality disorder: Secondary | ICD-10-CM | POA: Diagnosis not present

## 2015-07-08 DIAGNOSIS — F4310 Post-traumatic stress disorder, unspecified: Secondary | ICD-10-CM | POA: Diagnosis not present

## 2015-07-08 DIAGNOSIS — F411 Generalized anxiety disorder: Secondary | ICD-10-CM | POA: Diagnosis not present

## 2015-07-08 DIAGNOSIS — F341 Dysthymic disorder: Secondary | ICD-10-CM | POA: Diagnosis not present

## 2015-07-24 DIAGNOSIS — M545 Low back pain: Secondary | ICD-10-CM | POA: Diagnosis not present

## 2015-07-24 DIAGNOSIS — G8929 Other chronic pain: Secondary | ICD-10-CM | POA: Diagnosis not present

## 2015-07-25 DIAGNOSIS — F332 Major depressive disorder, recurrent severe without psychotic features: Secondary | ICD-10-CM | POA: Diagnosis not present

## 2015-07-28 ENCOUNTER — Ambulatory Visit (INDEPENDENT_AMBULATORY_CARE_PROVIDER_SITE_OTHER): Payer: BLUE CROSS/BLUE SHIELD | Admitting: Neurology

## 2015-07-28 ENCOUNTER — Encounter: Payer: Self-pay | Admitting: Neurology

## 2015-07-28 VITALS — BP 116/72 | HR 75 | Ht 64.5 in | Wt 168.6 lb

## 2015-07-28 DIAGNOSIS — R258 Other abnormal involuntary movements: Secondary | ICD-10-CM

## 2015-07-28 DIAGNOSIS — R51 Headache: Secondary | ICD-10-CM | POA: Diagnosis not present

## 2015-07-28 DIAGNOSIS — R519 Headache, unspecified: Secondary | ICD-10-CM

## 2015-07-28 DIAGNOSIS — G1229 Other motor neuron disease: Secondary | ICD-10-CM

## 2015-07-28 DIAGNOSIS — G1221 Amyotrophic lateral sclerosis: Secondary | ICD-10-CM

## 2015-07-28 NOTE — Progress Notes (Signed)
[facility]    Provider:  Dr Jaynee Eagles Referring Provider: Melina Schools Primary Care Physician:  Marjorie Smolder, MD  CC:  Chronic headaches  HPI:  Lorraine May is a very nice 60 y.o. female here as a referral from Dr. Inda Merlin for chronic headaches. PMHx major depression, generalized anxiety, headaches, PTSD, Stage 3 kidney disease, Osteopenia, colon caner s/p right hemicolectomy 2006. She has headaches for a year, no inciting events or head trauma but she was trying all kinds "loads" of medications for her mood disorder. Never had headaches int he past or any history of migraines. She has multiple types of headaches, occasionally will get ice pick in the right temporal area which occurs every several weeks and is brief, no eye watering or nose running but very painful, no time of day preference. She has a headache right now on the top of her head and behind the eyes, she saw an ophthalmologist who sent her stat for temporal arteritis but labwork was negative. 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 The headache is a pressure headache, more pressure and pain, consistent and continuous over 4-5 hours and a few tylenol help, she takes tylenol 3-4 times a week. She had TMS therapy for depression last year and this resulted in headaches. Headaches most days of the week,she wakes up with headaches. She snores mildly. She had an MRI of the brain but may have started after the MRI last April. The clonidine was started about the same time as the headaches. She has no significant daytime somnolence and no other signs of obstructive sleep apnea. No other focal neurologic deficits or complaints. No double vision, no aphasia, no dysarthria, no weakness, no sensory changes. No bowel or bladder changes. No fever or systemic symptoms. She also has a lot of tightness in the neck associated with headaches.  Reviewed notes, labs and imaging from outside physicians, which showed: Patient brings in a CD with  the images, personally reviewed. MRI of the cervical spine was largely unremarkable, at C4-C5 minimal central canal and moderate right foraminal stenosis. At C5-C6 minimal central canal and moderate right foraminal stenosis.  Patient was referred by [facility] orthopedics. She is seeing them for neck pain. Neck Stiffness and headaches. Symptoms exacerbated by turning the head to the right and turning the head to the left. Treated with non-opioid analgesics. Patient also reported low back pain. Mild neck discomfort, worse with range of motion and deep palpation. Exam significant for 10 beats of clonus in the lower extremities bilaterally, 3+ brisk deep tendon reflexes at the knees, Achilles, biceps, triceps and brachial radialis. Toes are flexor. Negative Hoffman sign. Normal gait pattern. Slight limp when she walks because of right-sided gluteal and lateral hip pain that she is not ataxic and she has no significant imbalance. Negative Romberg. MRI reviewed showed no cord signal changes. Mild multilevel degenerative disease no significant high-grade neural compression. No evidence to suggest cervical spondylitic myelopathy.  BMP unremarkable creatinine slightly elevated 1.02, CBC normal  Review of Systems: Patient complains of symptoms per HPI as well as the following symptoms: blind in right eye, swelling in legs, snoring, diarrhea, urination problems, feeling cold, joint pain, aching muscles, allergies, skin sensitivity, memory loss, headache, insommnia, depression, anxiety. Pertinent negatives per HPI. All others negative.   Social History   Social History  . Marital Status: Married    Spouse Name: Herbie Baltimore  . Number of Children: 1  . Years of Education: 16   Occupational History  .  Accounting- Retired    Social History Main Topics  . Smoking status: Never Smoker   . Smokeless tobacco: Never Used  . Alcohol Use: Yes     Comment: occ  . Drug Use: No  . Sexual Activity: Not on file   Other  Topics Concern  . Not on file   Social History Narrative   Lives with husband   Caffeine use: 2-3 cups per day    Family History  Problem Relation Age of Onset  . Diabetes Mother     type 1  . Stroke Mother   . Dementia Mother   . Alcohol abuse Father   . Cancer Paternal Aunt     germ cell  . Cancer Maternal Grandmother     colon  . Cancer Maternal Uncle     throat  . Thyroid disease Mother   . Thyroid disease Sister   . Alcohol abuse Mother   . Alcohol abuse Brother   . Psychiatric Illness Brother   . Psychiatric Illness Sister   . Diabetes Father   . Asthma Mother   . Asthma Sister     Past Medical History  Diagnosis Date  . Hx of colon cancer, stage II 08/24/2011    T3N0  adenoca cecum resected 04/2004  Xeloda adjuvant chemo  . Hyperlipidemia 08/24/2011  . Cancer ([place])     colon  . Arthritis   . Anxiety and depression 08/24/2011  . Anxiety   . Depression   . Joint pain   . PONV (postoperative nausea and vomiting)   . Nausea & vomiting     Past Surgical History  Procedure Laterality Date  . Knee arthroscopy  2010  . Hernia repair  2007  . Port-a-cath removal  2006    insertion and removal  . Abdominal hysterectomy  2004  . Ectopic pregnancy surgery  1990  . Breast surgery  2011    reduction  . Cholecystectomy  10/05/2011    Procedure: LAPAROSCOPIC CHOLECYSTECTOMY WITH INTRAOPERATIVE CHOLANGIOGRAM;  Surgeon: Stark Klein, MD;  Location: [facility];  Service: General;  Laterality: N/A;  . Strabismus surgery  1962  . Nasal septum surgery  1983  . Hemicolectomy Right 2006  . Other surgical history  2016    Transcranial magnetic stimulation    Current Outpatient Prescriptions  Medication Sig Dispense Refill  . ALPRAZolam (XANAX XR) 0.5 MG 24 hr tablet Take 0.5 mg by mouth daily.    . cholestyramine (QUESTRAN) 4 G packet Take 1 packet by mouth daily.    . cloNIDine (CATAPRES) 0.1 MG tablet Take 0.1 mg by mouth at bedtime.    . dicyclomine (BENTYL) 20 MG tablet  Take 20 mg by mouth daily as needed for spasms.     . ergocalciferol (VITAMIN D2) 50000 UNITS capsule Take 50,000 Units by mouth once a week. Take on sundays    . ESTRACE VAGINAL 0.1 MG/GM vaginal cream Place 1 Applicatorful vaginally once a week.     . lamoTRIgine (LAMICTAL) 200 MG tablet Take 200 mg by mouth daily.     . simvastatin (ZOCOR) 40 MG tablet Take 40 mg by mouth every evening.    . traZODone (DESYREL) 50 MG tablet Take 12.5 mg by mouth at bedtime.    Marland Kitchen zolpidem (AMBIEN CR) 6.25 MG CR tablet Take 6.25 mg by mouth daily.  0  . methocarbamol (ROBAXIN) 500 MG tablet Take 500 mg by mouth 3 (three) times daily. Reported on 07/28/2015  0  . valACYclovir (  VALTREX) 500 MG tablet Take 500 mg by mouth as needed. Reported on 07/28/2015     No current facility-administered medications for this visit.    Allergies as of 07/28/2015 - Review Complete 07/28/2015  Allergen Reaction Noted  . Dilaudid [hydromorphone hcl] Itching 08/24/2011  . Fentanyl Itching 08/22/2012  . Morphine and related Itching 08/24/2011  . Percocet [oxycodone-acetaminophen]  08/21/2013  . Tetracyclines & related Nausea Only 08/24/2011  . Sulfa antibiotics Rash 08/24/2011    Vitals: BP 116/72 mmHg  Pulse 75  Ht 5' 4.5" (1.638 m)  Wt 168 lb 9.6 oz (76.476 kg)  BMI 28.50 kg/m2 Last Weight:  Wt Readings from Last 1 Encounters:  07/28/15 168 lb 9.6 oz (76.476 kg)   Last Height:   Ht Readings from Last 1 Encounters:  07/28/15 5' 4.5" (1.638 m)    Physical exam: Exam: Gen: NAD, conversant, well nourised, overweight , well groomed                     CV: RRR, no MRG. No Carotid Bruits. No peripheral edema, warm, nontender Eyes: Conjunctivae clear without exudates or hemorrhage  Neuro: Detailed Neurologic Exam  Speech:    Speech is normal; fluent and spontaneous with normal comprehension.  Cognition:    The patient is oriented to person, place, and time;     recent and remote memory intact;     language  fluent;     normal attention, concentration,     fund of knowledge Cranial Nerves:    The pupils are equal, round, and reactive to light. Attempted funduscopic exam could not visualize. Visual fields are full to finger confrontation. Extraocular movements are intact. Trigeminal sensation is intact and the muscles of mastication are normal. The face is symmetric. The palate elevates in the midline. Hearing intact. Voice is normal. Shoulder shrug is normal. The tongue has normal motion without fasciculations.   Coordination:    Normal finger to nose and heel to shin. Normal rapid alternating movements.   Gait:    Heel-toe and tandem gait are normal.   Motor Observation:    No asymmetry, no atrophy, and no involuntary movements noted. Tone:    Normal muscle tone.    Posture:    Posture is normal. normal erect    Strength: Left hip flexion 4+/5.    Strength is V/V in the upper and lower limbs.      Sensation: intact to LT     Reflex Exam:  DTR's:    Deep tendon reflexes in the upper and lower extremities are brisk in the uppers with clonus in the lowers bilaterally.   Toes:    The toes are downgoing bilaterally.   Clonus:    Clonus in the bilat patellars and ankles. .      Assessment/Plan:  Very nice 60 y.o. female here as a referral from Dr. Rolena Infante for chronic headaches. PMHx major depression, generalized anxiety, headaches, PTSD, Stage 3 kidney disease, Osteopenia, colon caner s/p right hemicolectomy 2006. She has headaches for a year in the setting of medical management for severe refractory depression. One medication that may be the culprit is clonidine which can cause headaches and was started around the same time. Patient also has some interesting physical findings including bilateral lower extremity clonus at the patellars and the ankle jerks.  - Stop clonidine and ensure that this is the medication that might be causing her headaches, I recommend calling the physician who  prescribed this apparently it  is for sleep per patient. - Mri brain w/wo contrast given new onset headaches after the age of 38 as well as clonus on exam. - At this point not comfortable trying any new medications for headache, patient has a very complicated history of depression and complicated history of medication management. We'll try discontinuing clonidine as long as physician who prescribed it is okay with that and see what happens since onset of headache corresponds with this medication, patient can always come back if this doesn't work. - Follow up 4 months or sooner if needed  To prevent or relieve headaches, try the following: Cool Compress. Lie down and place a cool compress on your head.  Avoid headache triggers. If certain foods or odors seem to have triggered your migraines in the past, avoid them. A headache diary might help you identify triggers.  Include physical activity in your daily routine. Try a daily walk or other moderate aerobic exercise.  Manage stress. Find healthy ways to cope with the stressors, such as delegating tasks on your to-do list.  Practice relaxation techniques. Try deep breathing, yoga, massage and visualization.  Eat regularly. Eating regularly scheduled meals and maintaining a healthy diet might help prevent headaches. Also, drink plenty of fluids.  Follow a regular sleep schedule. Sleep deprivation might contribute to headaches Consider biofeedback. With this mind-body technique, you learn to control certain bodily functions - such as muscle tension, heart rate and blood pressure - to prevent headaches or reduce headache pain.    Proceed to emergency room if you experience new or worsening symptoms or symptoms do not resolve, if you have new neurologic symptoms or if headache is severe, or for any concerning symptom.   Cc: Dr. Rolena Infante, Dr. Evelina Dun, [facility] [address] [place], [place]  [address]  Phone [phone] Fax [phone]

## 2015-07-28 NOTE — Patient Instructions (Signed)
Remember to drink plenty of fluid, eat healthy meals and do not skip any meals. Try to eat protein with a every meal and eat a healthy snack such as fruit or nuts in between meals. Try to keep a regular sleep-wake schedule and try to exercise daily, particularly in the form of walking, 20-30 minutes a day, if you can.   As far as diagnostic testing: MRI brain w/wo contrast, lab  I would like to see you back in 4 months, sooner if we need to. Please call us with any interim questions, concerns, problems, updates or refill requests.   Our phone number is [phone]. We also have an after hours call service for urgent matters and there is a physician on-call for urgent questions. For any emergencies you know to call 911 or go to the nearest emergency room

## 2015-07-29 ENCOUNTER — Telehealth: Payer: Self-pay | Admitting: *Deleted

## 2015-07-29 DIAGNOSIS — F4310 Post-traumatic stress disorder, unspecified: Secondary | ICD-10-CM | POA: Diagnosis not present

## 2015-07-29 DIAGNOSIS — G8929 Other chronic pain: Secondary | ICD-10-CM | POA: Diagnosis not present

## 2015-07-29 DIAGNOSIS — F411 Generalized anxiety disorder: Secondary | ICD-10-CM | POA: Diagnosis not present

## 2015-07-29 DIAGNOSIS — M545 Low back pain: Secondary | ICD-10-CM | POA: Diagnosis not present

## 2015-07-29 DIAGNOSIS — F606 Avoidant personality disorder: Secondary | ICD-10-CM | POA: Diagnosis not present

## 2015-07-29 DIAGNOSIS — F341 Dysthymic disorder: Secondary | ICD-10-CM | POA: Diagnosis not present

## 2015-07-29 LAB — BASIC METABOLIC PANEL
BUN/Creatinine Ratio: 17 (ref 12–28)
BUN: 17 mg/dL (ref 8–27)
CO2: 22 mmol/L (ref 18–29)
Calcium: 9.1 mg/dL (ref 8.7–10.3)
Chloride: 104 mmol/L (ref 96–106)
Creatinine, Ser: 1.02 mg/dL — ABNORMAL HIGH (ref 0.57–1.00)
GFR calc Af Amer: 69 mL/min/{1.73_m2} (ref >59)
GFR calc non Af Amer: 60 mL/min/{1.73_m2} (ref >59)
Glucose: 98 mg/dL (ref 65–99)
Potassium: 5.2 mmol/L (ref 3.5–5.2)
Sodium: 143 mmol/L (ref 134–144)

## 2015-07-29 NOTE — Telephone Encounter (Signed)
-----   Message from Melvenia Beam, MD sent at 07/29/2015 10:17 AM EDT ----- Labs unremarkable. Her creatinine is borderline normal but actually better than it was a year ago(this is chronic). thanks

## 2015-07-29 NOTE — Telephone Encounter (Signed)
LVM for pt about results. Gave [facility] phone number if she has further questions.

## 2015-07-31 DIAGNOSIS — M545 Low back pain: Secondary | ICD-10-CM | POA: Diagnosis not present

## 2015-07-31 DIAGNOSIS — G8929 Other chronic pain: Secondary | ICD-10-CM | POA: Diagnosis not present

## 2015-08-04 DIAGNOSIS — G8929 Other chronic pain: Secondary | ICD-10-CM | POA: Diagnosis not present

## 2015-08-04 DIAGNOSIS — M545 Low back pain: Secondary | ICD-10-CM | POA: Diagnosis not present

## 2015-08-07 ENCOUNTER — Telehealth: Payer: Self-pay | Admitting: Neurology

## 2015-08-07 ENCOUNTER — Other Ambulatory Visit: Payer: Self-pay | Admitting: Neurology

## 2015-08-07 MED ORDER — ALPRAZOLAM 0.5 MG PO TABS: ORAL_TABLET | ORAL | Status: DC

## 2015-08-07 NOTE — Telephone Encounter (Signed)
Called and LVM for pt. Advised I checked on MRI and it looks like she is scheduled on 7/14 at 230pm. Asked her to call back if this is incorrect. Advised Dr Jaynee Eagles ok with giving her additional xanax prior to MRI. Advised she can take 1-2 tablets 30 min prior to MRI and additional one at time of test if needed. Advised that she must have driver. She should not drive to and from test. Gave [facility] phone number if she has further questions.  Faxed printed/signed rx to [phone]. Received confirmation.

## 2015-08-07 NOTE — Telephone Encounter (Signed)
Patient called to check status of MRI appointment, states she hasn't heard from anyone. Please call [phone].

## 2015-08-07 NOTE — Telephone Encounter (Signed)
Pt called to advise she will need something to help her relax for MRI on 08/15/15. Sts takes xanax XR 0.5mg  daily in am. She has completed an MRI in the past that lasted 20 min, this MRI will last 45 min and she is concerned.

## 2015-08-07 NOTE — Telephone Encounter (Signed)
Dr Ahern- please advise 

## 2015-08-07 NOTE — Telephone Encounter (Signed)
Follow up with Danielle about the MRI. I will give her additional xanax before the MRI thanks

## 2015-08-11 DIAGNOSIS — G8929 Other chronic pain: Secondary | ICD-10-CM | POA: Diagnosis not present

## 2015-08-11 DIAGNOSIS — M545 Low back pain: Secondary | ICD-10-CM | POA: Diagnosis not present

## 2015-08-12 DIAGNOSIS — F4310 Post-traumatic stress disorder, unspecified: Secondary | ICD-10-CM | POA: Diagnosis not present

## 2015-08-12 DIAGNOSIS — F411 Generalized anxiety disorder: Secondary | ICD-10-CM | POA: Diagnosis not present

## 2015-08-12 DIAGNOSIS — F606 Avoidant personality disorder: Secondary | ICD-10-CM | POA: Diagnosis not present

## 2015-08-12 DIAGNOSIS — F341 Dysthymic disorder: Secondary | ICD-10-CM | POA: Diagnosis not present

## 2015-08-15 ENCOUNTER — Ambulatory Visit
Admission: RE | Admit: 2015-08-15 | Discharge: 2015-08-15 | Disposition: A | Discharge status: Home / Self Care | Payer: BLUE CROSS/BLUE SHIELD | Source: Ambulatory Visit | Attending: Neurology | Admitting: Neurology

## 2015-08-15 DIAGNOSIS — R51 Headache: Principal | ICD-10-CM

## 2015-08-15 DIAGNOSIS — R258 Other abnormal involuntary movements: Secondary | ICD-10-CM

## 2015-08-15 DIAGNOSIS — G1221 Amyotrophic lateral sclerosis: Secondary | ICD-10-CM

## 2015-08-15 DIAGNOSIS — G1229 Other motor neuron disease: Secondary | ICD-10-CM

## 2015-08-15 DIAGNOSIS — R519 Headache, unspecified: Secondary | ICD-10-CM

## 2015-08-15 MED ORDER — GADOBENATE DIMEGLUMINE 529 MG/ML IV SOLN
15.0000 mL | Freq: Once | INTRAVENOUS | Status: AC | PRN
  Administered 2015-08-15: 15 mL via INTRAVENOUS

## 2015-08-18 ENCOUNTER — Telehealth: Payer: Self-pay | Admitting: *Deleted

## 2015-08-18 DIAGNOSIS — R292 Abnormal reflex: Secondary | ICD-10-CM

## 2015-08-18 DIAGNOSIS — G8929 Other chronic pain: Secondary | ICD-10-CM | POA: Diagnosis not present

## 2015-08-18 DIAGNOSIS — M545 Low back pain: Secondary | ICD-10-CM | POA: Diagnosis not present

## 2015-08-18 NOTE — Telephone Encounter (Signed)
Dear Lorraine May, I spoke with the patient by phone and a short her that her MRI findings are normal for her age and by mouth group but this I also don't explain the clonus, her main concern when she was referred to neurology by her orthopedics. She reported to me that she had a next spine MRI through the orthopedist before her brain MRI was performed here last Friday. She would like a call back to see what further workup is needed to explain the origin of her clonus and upgoing Babinski response. She is happy to wait until you have return to the office.Larey Seat, M.D.

## 2015-08-18 NOTE — Telephone Encounter (Signed)
Per Dr Jaynee Eagles, spoke with patient and informed her that her MRI brain is normal for her age and unchanged since 05/2014. She verbalized understanding. She then stated that she is concerned about what she has seen on her My Chart re: Dr Cathren Laine diagnoses on her office visit dated 07/28/15. She stated she is concerned about diagnoses: clonus and upper motor neuron lesion. Informed her that Dr Jaynee Eagles is out of the office this week. She is requesting call back next week, stated she is concerned this may indicate MS. Informed her this RN would route her questions. She verbalized appreciation.

## 2015-08-18 NOTE — Telephone Encounter (Signed)
Lorraine May, See the conversation below. Can you reassure patient that the diagnosis listed(upper motor neuron lesion) is something I am looking to rule out given her brisk reflexes and clonus as this diagnosis can be seen with clonus however I cannot find proof of any upper motor neuron lesions at all, everything looks great so far - please reassure her. Her brain and cervical spine looked fine and she does not have Multiple Sclerosis or we would have seen lesions here. The only other place we should check is her thoracic spine. If that is normal, her clonus may just be benign and I can just follow her every 6 months for any changes. Some people do have extremely brisk reflexes normally. However we want to make sure she does not have an upper motor neuron lesion, that is the reason for the tests. And again, so far she looks great and all is fine.  (Dr. Brett Fairy mentions upgoing toe, she does not have an upgoing toe btw, no babinski, that may have been mistyped by Dr. Keturah Barre).  If patient is amenable to another MRI of the thoracic spine, let me know and I can order it - this would be a much less likely spot for a spinal cord lesion but one here could cause clonus. We could make sure this is normal as well. Let me know and I can order it thanks.

## 2015-08-19 ENCOUNTER — Telehealth: Payer: Self-pay | Admitting: *Deleted

## 2015-08-19 DIAGNOSIS — F606 Avoidant personality disorder: Secondary | ICD-10-CM | POA: Diagnosis not present

## 2015-08-19 DIAGNOSIS — F411 Generalized anxiety disorder: Secondary | ICD-10-CM | POA: Diagnosis not present

## 2015-08-19 DIAGNOSIS — F4310 Post-traumatic stress disorder, unspecified: Secondary | ICD-10-CM | POA: Diagnosis not present

## 2015-08-19 DIAGNOSIS — F341 Dysthymic disorder: Secondary | ICD-10-CM | POA: Diagnosis not present

## 2015-08-19 NOTE — Telephone Encounter (Signed)
Spoke to patient and reviewed the information below.  She verbalized understanding and is agreeable to proceed with the thoracic MRI.

## 2015-08-19 NOTE — Telephone Encounter (Signed)
LVM for patient advising her that this RN has message from Dr Jaynee Eagles in response to her questions yesterday. Advised she may call back today or wait until Dr Jaynee Eagles returns to office next week; whatever is her preference. Left name, number.

## 2015-08-20 ENCOUNTER — Other Ambulatory Visit: Payer: Self-pay | Admitting: Neurology

## 2015-08-20 DIAGNOSIS — R258 Other abnormal involuntary movements: Secondary | ICD-10-CM

## 2015-08-20 DIAGNOSIS — G1221 Amyotrophic lateral sclerosis: Secondary | ICD-10-CM

## 2015-08-20 DIAGNOSIS — M4714 Other spondylosis with myelopathy, thoracic region: Secondary | ICD-10-CM

## 2015-08-20 DIAGNOSIS — G1229 Other motor neuron disease: Secondary | ICD-10-CM

## 2015-08-20 NOTE — Addendum Note (Signed)
Addended by: Marcial Pacas on: 08/20/2015 07:56 AM   Modules accepted: Orders

## 2015-08-25 ENCOUNTER — Ambulatory Visit: Payer: Self-pay | Admitting: Neurology

## 2015-08-25 DIAGNOSIS — F332 Major depressive disorder, recurrent severe without psychotic features: Secondary | ICD-10-CM | POA: Diagnosis not present

## 2015-08-26 DIAGNOSIS — F606 Avoidant personality disorder: Secondary | ICD-10-CM | POA: Diagnosis not present

## 2015-08-26 DIAGNOSIS — F341 Dysthymic disorder: Secondary | ICD-10-CM | POA: Diagnosis not present

## 2015-08-26 DIAGNOSIS — F411 Generalized anxiety disorder: Secondary | ICD-10-CM | POA: Diagnosis not present

## 2015-08-26 DIAGNOSIS — F4310 Post-traumatic stress disorder, unspecified: Secondary | ICD-10-CM | POA: Diagnosis not present

## 2015-08-27 ENCOUNTER — Telehealth: Payer: Self-pay | Admitting: Neurology

## 2015-08-27 NOTE — Telephone Encounter (Signed)
Patient is returning your call.  

## 2015-09-01 DIAGNOSIS — M545 Low back pain: Secondary | ICD-10-CM | POA: Diagnosis not present

## 2015-09-02 DIAGNOSIS — F4310 Post-traumatic stress disorder, unspecified: Secondary | ICD-10-CM | POA: Diagnosis not present

## 2015-09-02 DIAGNOSIS — F606 Avoidant personality disorder: Secondary | ICD-10-CM | POA: Diagnosis not present

## 2015-09-02 DIAGNOSIS — F341 Dysthymic disorder: Secondary | ICD-10-CM | POA: Diagnosis not present

## 2015-09-02 DIAGNOSIS — F411 Generalized anxiety disorder: Secondary | ICD-10-CM | POA: Diagnosis not present

## 2015-09-08 ENCOUNTER — Ambulatory Visit
Admission: RE | Admit: 2015-09-08 | Discharge: 2015-09-08 | Disposition: A | Discharge status: Home / Self Care | Payer: BLUE CROSS/BLUE SHIELD | Source: Ambulatory Visit | Attending: Neurology | Admitting: Neurology

## 2015-09-08 DIAGNOSIS — M4714 Other spondylosis with myelopathy, thoracic region: Secondary | ICD-10-CM | POA: Diagnosis not present

## 2015-09-08 DIAGNOSIS — R258 Other abnormal involuntary movements: Secondary | ICD-10-CM

## 2015-09-08 DIAGNOSIS — G1229 Other motor neuron disease: Secondary | ICD-10-CM

## 2015-09-08 DIAGNOSIS — G1221 Amyotrophic lateral sclerosis: Secondary | ICD-10-CM

## 2015-09-08 MED ORDER — GADOBENATE DIMEGLUMINE 529 MG/ML IV SOLN
15.0000 mL | Freq: Once | INTRAVENOUS | Status: AC | PRN
  Administered 2015-09-08: 15 mL via INTRAVENOUS

## 2015-09-09 ENCOUNTER — Other Ambulatory Visit: Payer: Self-pay

## 2015-09-09 DIAGNOSIS — F314 Bipolar disorder, current episode depressed, severe, without psychotic features: Secondary | ICD-10-CM | POA: Diagnosis not present

## 2015-09-09 DIAGNOSIS — F606 Avoidant personality disorder: Secondary | ICD-10-CM | POA: Diagnosis not present

## 2015-09-09 DIAGNOSIS — F411 Generalized anxiety disorder: Secondary | ICD-10-CM | POA: Diagnosis not present

## 2015-09-09 DIAGNOSIS — F4310 Post-traumatic stress disorder, unspecified: Secondary | ICD-10-CM | POA: Diagnosis not present

## 2015-09-10 DIAGNOSIS — M545 Low back pain: Secondary | ICD-10-CM | POA: Diagnosis not present

## 2015-09-10 NOTE — Telephone Encounter (Signed)
Patient had MRI at [facility].  °

## 2015-09-12 ENCOUNTER — Telehealth: Payer: Self-pay | Admitting: *Deleted

## 2015-09-12 NOTE — Telephone Encounter (Signed)
LMVM for her on home # (ok per DPR), MRI thoracic unremarkable.  Did not show cause of sx, will monitor.  She is to call back if questions.

## 2015-09-12 NOTE — Telephone Encounter (Signed)
-----   Message from Melvenia Beam, MD sent at 09/11/2015  5:52 PM EDT ----- MRI of the thoracic spine was unremarkable. No cause for patient's symptoms. At this point we can just continue to follow her clinically as we have ruled out causes to the brain and spinal cord as causes for her symptoms. thanks

## 2015-09-16 DIAGNOSIS — M7061 Trochanteric bursitis, right hip: Secondary | ICD-10-CM | POA: Diagnosis not present

## 2015-09-16 DIAGNOSIS — F4310 Post-traumatic stress disorder, unspecified: Secondary | ICD-10-CM | POA: Diagnosis not present

## 2015-09-16 DIAGNOSIS — F341 Dysthymic disorder: Secondary | ICD-10-CM | POA: Diagnosis not present

## 2015-09-16 DIAGNOSIS — F606 Avoidant personality disorder: Secondary | ICD-10-CM | POA: Diagnosis not present

## 2015-09-16 DIAGNOSIS — M545 Low back pain: Secondary | ICD-10-CM | POA: Diagnosis not present

## 2015-09-16 DIAGNOSIS — F411 Generalized anxiety disorder: Secondary | ICD-10-CM | POA: Diagnosis not present

## 2015-09-23 DIAGNOSIS — F341 Dysthymic disorder: Secondary | ICD-10-CM | POA: Diagnosis not present

## 2015-09-23 DIAGNOSIS — F4310 Post-traumatic stress disorder, unspecified: Secondary | ICD-10-CM | POA: Diagnosis not present

## 2015-09-23 DIAGNOSIS — F411 Generalized anxiety disorder: Secondary | ICD-10-CM | POA: Diagnosis not present

## 2015-09-23 DIAGNOSIS — F606 Avoidant personality disorder: Secondary | ICD-10-CM | POA: Diagnosis not present

## 2015-09-29 DIAGNOSIS — E7800 Pure hypercholesterolemia, unspecified: Secondary | ICD-10-CM | POA: Diagnosis not present

## 2015-09-29 DIAGNOSIS — Z23 Encounter for immunization: Secondary | ICD-10-CM | POA: Diagnosis not present

## 2015-09-29 DIAGNOSIS — M7060 Trochanteric bursitis, unspecified hip: Secondary | ICD-10-CM | POA: Diagnosis not present

## 2015-09-29 DIAGNOSIS — N183 Chronic kidney disease, stage 3 (moderate): Secondary | ICD-10-CM | POA: Diagnosis not present

## 2015-09-30 DIAGNOSIS — F341 Dysthymic disorder: Secondary | ICD-10-CM | POA: Diagnosis not present

## 2015-09-30 DIAGNOSIS — F4310 Post-traumatic stress disorder, unspecified: Secondary | ICD-10-CM | POA: Diagnosis not present

## 2015-09-30 DIAGNOSIS — F606 Avoidant personality disorder: Secondary | ICD-10-CM | POA: Diagnosis not present

## 2015-09-30 DIAGNOSIS — F411 Generalized anxiety disorder: Secondary | ICD-10-CM | POA: Diagnosis not present

## 2015-10-07 DIAGNOSIS — F4310 Post-traumatic stress disorder, unspecified: Secondary | ICD-10-CM | POA: Diagnosis not present

## 2015-10-07 DIAGNOSIS — F341 Dysthymic disorder: Secondary | ICD-10-CM | POA: Diagnosis not present

## 2015-10-07 DIAGNOSIS — F606 Avoidant personality disorder: Secondary | ICD-10-CM | POA: Diagnosis not present

## 2015-10-07 DIAGNOSIS — F411 Generalized anxiety disorder: Secondary | ICD-10-CM | POA: Diagnosis not present

## 2015-10-08 ENCOUNTER — Ambulatory Visit: Payer: BLUE CROSS/BLUE SHIELD | Attending: Family Medicine | Admitting: Physical Therapy

## 2015-10-08 ENCOUNTER — Encounter: Payer: Self-pay | Admitting: Physical Therapy

## 2015-10-08 DIAGNOSIS — M25551 Pain in right hip: Secondary | ICD-10-CM | POA: Diagnosis not present

## 2015-10-08 DIAGNOSIS — M6281 Muscle weakness (generalized): Secondary | ICD-10-CM

## 2015-10-08 DIAGNOSIS — M545 Low back pain, unspecified: Secondary | ICD-10-CM

## 2015-10-08 DIAGNOSIS — M25552 Pain in left hip: Secondary | ICD-10-CM | POA: Diagnosis not present

## 2015-10-08 NOTE — Therapy (Signed)
[facility] [address] [place], [place], [address] Phone: [phone]   Fax:  [phone]  Physical Therapy Evaluation  Patient Details  Name: Lorraine May MRN: [id number] Date of Birth: August 15, 1955 Referring Provider: Dr. Darcus Austin  Encounter Date: 10/08/2015      PT End of Session - 10/08/15 1305    Visit Number 1   Date for PT Re-Evaluation 12/03/15   PT Start Time 1230   PT Stop Time 1305   PT Time Calculation (min) 35 min   Activity Tolerance Patient tolerated treatment well   Behavior During Therapy [facility] for tasks assessed/performed      Past Medical History:  Diagnosis Date  . Anxiety   . Anxiety and depression 08/24/2011  . Arthritis   . Cancer ([place])    colon  . Depression   . Hx of colon cancer, stage II 08/24/2011   T3N0  adenoca cecum resected 04/2004  Xeloda adjuvant chemo  . Hyperlipidemia 08/24/2011  . Joint pain   . Nausea & vomiting   . PONV (postoperative nausea and vomiting)     Past Surgical History:  Procedure Laterality Date  . ABDOMINAL HYSTERECTOMY  2004  . BREAST SURGERY  2011   reduction  . CHOLECYSTECTOMY  10/05/2011   Procedure: LAPAROSCOPIC CHOLECYSTECTOMY WITH INTRAOPERATIVE CHOLANGIOGRAM;  Surgeon: Stark Klein, MD;  Location: [place];  Service: General;  Laterality: N/A;  . [place]  . HEMICOLECTOMY Right 2006  . HERNIA REPAIR  2007  . KNEE ARTHROSCOPY  2010  . NASAL SEPTUM SURGERY  1983  . OTHER SURGICAL HISTORY  2016   Transcranial magnetic stimulation  . PORT-A-CATH REMOVAL  2006   insertion and removal  . STRABISMUS SURGERY  1962    There were no vitals filed for this visit.       Subjective Assessment - 10/08/15 1236    Subjective Patient reports she started to  hip bursitis when she started to walk last year.  01/2016 pushed a box with right leg and had pain travel into her back. Patient had a shot in right leg and it went numb.  Patient had 8 sessions of PT. Was not happy with the care so wanted to come to another therapy place.    Limitations Sitting;Standing   Patient Stated Goals exercises at home to manage pain and resolve   Currently in Pain? Yes   Pain Score 5    Pain Location Hip   Pain Orientation Right;Left   Pain Descriptors / Indicators Aching   Pain Type Chronic pain   Pain Onset More than a month ago   Pain Frequency Intermittent   Aggravating Factors  lay on side; sitting in car while pushing foot on gas, standing too long   Pain Relieving Factors massage   Multiple Pain Sites Yes   Pain Score 7   Pain Location Back   Pain Orientation Mid   Pain Descriptors / Indicators Discomfort;Constant;Aching   Pain Type Chronic pain   Pain Onset More than a month ago   Pain Frequency Constant   Aggravating Factors  sititng, standing   Pain Relieving Factors massage            [facility] PT Assessment - 10/08/15 0001      Assessment   Medical Diagnosis M70.60 Trochanteric bursitis, unspecified hip   Referring Provider Dr. Darcus Austin   Onset Date/Surgical Date 01/02/15   Prior Therapy 8 sessions  Precautions   Precautions Other (comment)   Precaution Comments cancer precautions     Restrictions   Weight Bearing Restrictions No     Balance Screen   Has the patient fallen in the past 6 months No   Has the patient had a decrease in activity level because of a fear of falling?  No   Is the patient reluctant to leave their home because of a fear of falling?  No     Home Ecologist residence     Prior Function   Level of Independence Independent   Vocation Retired   Leisure read     Charity fundraiser Status Within Functional Limits for tasks assessed     Observation/Other Assessments   Focus on Therapeutic Outcomes (FOTO)  54% limitation  43% limitation     Posture/Postural Control   Posture/Postural Control No significant limitations      ROM / Strength   AROM / PROM / Strength Strength;AROM     AROM   Overall AROM Comments bil. hip extension AROM -5 degrees; tightness located in lumbar paraspinals     Strength   Right Hip Extension 3+/5   Right Hip External Rotation  4/5   Right Hip Internal Rotation 4/5   Right Hip ABduction 4+/5   Left Hip Extension 3+/5   Left Hip External Rotation 4/5   Left Hip Internal Rotation 4/5   Left Hip ABduction 3+/5     Palpation   Palpation comment tenderness located on bil. SI joint, bil. gluteals, bil. psoas, bil. iliotibial band,                            PT Education - 10/08/15 1304    Education provided Yes   Education Details body mechanics with daily tasks   Person(s) Educated Patient   Methods Explanation;Demonstration;Handout   Comprehension Verbalized understanding;Returned demonstration          PT Short Term Goals - 10/08/15 1317      PT SHORT TERM GOAL #1   Title independent with initial HEP   Time 4   Period Weeks   Status New     PT SHORT TERM GOAL #2   Title understand correct body mechanics and sleeping positions to reduce strain on back and hips   Time 4   Period Weeks   Status New     PT SHORT TERM GOAL #3   Title pain with daily activities reduced >/= 25%   Time 4   Period Weeks   Status New     PT SHORT TERM GOAL #4   Title pain with sitting and walking decreased >/= 25%   Time 4   Period Weeks   Status New           PT Long Term Goals - 10/08/15 1252      PT LONG TERM GOAL #1   Title independent with HEP    Time 8   Period Weeks   Status New     PT LONG TERM GOAL #2   Title bil. hip strength >/= 4+/5 so she is able to stand or walk for 1 hour in a store   Time 8   Period Weeks   Status New     PT LONG TERM GOAL #3   Title sit for 2 hour to see a movie with pain decreased >/= 75%   Time  8   Period Weeks   Status New     PT LONG TERM GOAL #4   Title drive for 30 min with pain in right leg  decreased >/= 75%   Time 8   Period Weeks   Status New     PT LONG TERM GOAL #5   Title FOTO score </= 43% limitation   Time 8   Period Weeks   Status New     Additional Long Term Goals   Additional Long Term Goals Yes     PT LONG TERM GOAL #6   Title be able to perform daily home tasks with >/= 75% greater ease due to reduction in pain   Time 8   Period Weeks   Status New     PT LONG TERM GOAL #7   Title shave legs with >/= 75% greater ease due to increase mobility and reduction in pain   Time 8   Period Weeks   Status New               Plan - 10/08/15 1305    Clinical Impression Statement Patient is a 60 year old female with diagnosis of bil. trochanteric bursitis that started 01/02/2015 when she pushed a box with her foot. Patient reports constant back pain at level 7/10 and bil. hip pain at level 5/10.  Pain is worse with standing, walking, household chores, sitting.  Bilateral hip strength averages 3+/5. Bilateral hip extension is -5 degrees. Patient is of moderate complexity due to history of cancer, anxiety and depression.  Patient will benefit from physical therapy to reduce pain and improve mobility.    Rehab Potential Excellent   Clinical Impairments Affecting Rehab Potential NOne   PT Frequency 2x / week   PT Duration 8 weeks   PT Treatment/Interventions ADLs/Self Care Home Management;Cryotherapy;Electrical Stimulation;Iontophoresis 4mg /ml Dexamethasone;Moist Heat;Therapeutic activities;Therapeutic exercise;Neuromuscular re-education;Patient/family education;Passive range of motion;Manual techniques;Dry needling   PT Next Visit Plan dry needling bil. iliotibial band, gluteal, piriformis, psoas; ionto if MD signs, stretches, bil. hip strength   PT Home Exercise Plan hip strengthening   Recommended Other Services None   Consulted and Agree with Plan of Care Patient      Patient will benefit from skilled therapeutic intervention in order to improve the  following deficits and impairments:  Pain, Decreased strength, Decreased mobility, Decreased balance, Impaired flexibility, Improper body mechanics, Decreased activity tolerance, Decreased endurance, Decreased range of motion  Visit Diagnosis: Muscle weakness (generalized)  Pain in right hip  Pain in left hip  Midline low back pain without sciatica     Problem List Patient Active Problem List   Diagnosis Date Noted  . Severe recurrent major depression without psychotic features ([place])   . Suicidal ideation   . Chronic cholecystitis with calculus 09/27/2011  . Hx of colon cancer, stage II 08/24/2011  . Hyperlipidemia 08/24/2011  . Anxiety and depression 08/24/2011    Earlie Counts, PT 10/08/15 1:21 PM   [facility] [address], [place], [place], [address] Phone: [phone]   Fax:  [phone]  Name: Lorraine May MRN: [id number] Date of Birth: 25-Jul-1955

## 2015-10-08 NOTE — Patient Instructions (Addendum)
Sleeping on Side    Place pillow between knees. Use cervical support under neck and a roll around waist as needed. Pillow between ankles too  Copyright  VHI. All rights reserved.  Posture - Sitting    Sit upright, head facing forward. Try using a roll to support lower back. Keep shoulders relaxed, and avoid rounded back. Keep hips level with knees. Avoid crossing legs for long periods.   Copyright  VHI. All rights reserved.  Posture - Sitting    Sit upright, head facing forward. Try using a roll to support lower back. Keep shoulders relaxed, and avoid rounded back. Keep hips level with knees. Avoid crossing legs for long periods.   Copyright  VHI. All rights reserved.  Car    Before driving, adjust seat and steering (if tilt control) to ensure good posture. [place] and a lumbar roll can be used for positioning, whether riding or driving. Place roll in back of right hip.  Copyright  VHI. All rights reserved.  Standing    For prolonged standing, alternate placing one foot in front of the other or on a stool. Wear low-heeled shoes, and maintain good posture.   Copyright  VHI. All rights reserved.  Getting Into / Out of Car    Lower self onto seat, scoot back, then bring in one leg at a time. Reverse sequence to get out.   Copyright  VHI. All rights reserved.  Lifting Principles  .Maintain proper posture and head alignment. .Slide object as close as possible before lifting. .Move obstacles out of the way. .Test before lifting; ask for help if too heavy. .Tighten stomach muscles without holding breath. .Use smooth movements; do not jerk. .Use legs to do the work, and pivot with feet. .Distribute the work load symmetrically and close to the center of trunk. .Push instead of pull whenever possible.  Copyright  VHI. All rights reserved.  Deep Squat    Squat and lift with both arms held against upper trunk. Tighten stomach muscles without holding  breath. Use smooth movements to avoid jerking.  Copyright  VHI. All rights reserved.  [facility] [address] [place], [place] [address] Phone # [phone] Fax [phone]

## 2015-10-10 ENCOUNTER — Ambulatory Visit: Payer: BLUE CROSS/BLUE SHIELD | Admitting: Physical Therapy

## 2015-10-10 DIAGNOSIS — M6281 Muscle weakness (generalized): Secondary | ICD-10-CM | POA: Diagnosis not present

## 2015-10-10 DIAGNOSIS — M545 Low back pain, unspecified: Secondary | ICD-10-CM

## 2015-10-10 DIAGNOSIS — M25552 Pain in left hip: Secondary | ICD-10-CM | POA: Diagnosis not present

## 2015-10-10 DIAGNOSIS — M25551 Pain in right hip: Secondary | ICD-10-CM

## 2015-10-10 NOTE — Patient Instructions (Signed)
     Trigger Point Dry Needling  . What is Trigger Point Dry Needling (DN)? o DN is a physical therapy technique used to treat muscle pain and dysfunction. Specifically, DN helps deactivate muscle trigger points (muscle knots).  o A thin filiform needle is used to penetrate the skin and stimulate the underlying trigger point. The goal is for a local twitch response (LTR) to occur and for the trigger point to relax. No medication of any kind is injected during the procedure.   . What Does Trigger Point Dry Needling Feel Like?  o The procedure feels different for each individual patient. Some patients report that they do not actually feel the needle enter the skin and overall the process is not painful. Very mild bleeding may occur. However, many patients feel a deep cramping in the muscle in which the needle was inserted. This is the local twitch response.   . How Will I feel after the treatment? o Soreness is normal, and the onset of soreness may not occur for a few hours. Typically this soreness does not last longer than two days.  o Bruising is uncommon, however; ice can be used to decrease any possible bruising.  o In rare cases feeling tired or nauseous after the treatment is normal. In addition, your symptoms may get worse before they get better, this period will typically not last longer than 24 hours.   . What Can I do After My Treatment? o Increase your hydration by drinking more water for the next 24 hours. o You may place ice or heat on the areas treated that have become sore, however, do not use heat on inflamed or bruised areas. Heat often brings more relief post needling. o You can continue your regular activities, but vigorous activity is not recommended initially after the treatment for 24 hours. o DN is best combined with other physical therapy such as strengthening, stretching, and other therapies.    Jiles Goya PT [facility] [address], [address] [place], [place] [address] Phone # [phone] Fax [phone] 

## 2015-10-10 NOTE — Therapy (Addendum)
[facility] [address], [place], [place], [address] Phone: [phone]   Fax:  [phone]  Physical Therapy Treatment  Patient Details  Name: Lorraine May MRN: [id number] Date of Birth: [birth year] Referring Provider: Dr. Darcus Austin  Encounter Date: 10/10/2015      PT End of Session - 10/10/15 1157    Visit Number 2   Date for PT Re-Evaluation 12/03/15   PT Start Time [id number]   PT Stop Time 1110   PT Time Calculation (min) 55 min   Activity Tolerance Patient tolerated treatment well      Past Medical History:  Diagnosis Date  . Anxiety   . Anxiety and depression 08/24/2011  . Arthritis   . Cancer ([place])    colon  . Depression   . Hx of colon cancer, stage II 08/24/2011   T3N0  adenoca cecum resected 04/2004  Xeloda adjuvant chemo  . Hyperlipidemia 08/24/2011  . Joint pain   . Nausea & vomiting   . PONV (postoperative nausea and vomiting)     Past Surgical History:  Procedure Laterality Date  . ABDOMINAL HYSTERECTOMY  2004  . BREAST SURGERY  2011   reduction  . CHOLECYSTECTOMY  10/05/2011   Procedure: LAPAROSCOPIC CHOLECYSTECTOMY WITH INTRAOPERATIVE CHOLANGIOGRAM;  Surgeon: Stark Klein, MD;  Location: [place];  Service: General;  Laterality: N/A;  . [place]  . HEMICOLECTOMY Right 2006  . HERNIA REPAIR  2007  . KNEE ARTHROSCOPY  2010  . NASAL SEPTUM SURGERY  1983  . OTHER SURGICAL HISTORY  2016   Transcranial magnetic stimulation  . PORT-A-CATH REMOVAL  2006   insertion and removal  . STRABISMUS SURGERY  1962    There were no vitals filed for this visit.      Subjective Assessment - 10/10/15 1015    Subjective Right lateral thigh pain > left hip and now some low back pain;  LE numbness after injection;   today pain across low back from making bed and reaching into cabinets; some lateral hip pain.     Pertinent History mild to moderate kidney disease   Currently in Pain? Yes    Pain Score 5    Pain Location Back   Pain Orientation Right;Left   Pain Type Chronic pain   Pain Location Buttocks                         [facility] Adult PT Treatment/Exercise - 10/10/15 0001      Knee/Hip Exercises: Stretches   Other Knee/Hip Stretches lumbar rotation 20 sec 2 x right and left   Other Knee/Hip Stretches supine piriformis      Manual Therapy   Manual Therapy Joint mobilization;Soft tissue mobilization   Joint Mobilization right hip long axis distraction, inferior, PA in IR grade 3 3x 30 sec;  neutral gapping grade 3 3x 20 sec   Soft tissue mobilization bilateral paraspinals,  gluteus medius, piriformis           Trigger Point Dry Needling - 10/10/15 1156    Consent Given? Yes   Education Handout Provided Yes   Muscles Treated Lower Body Gluteus minimus;Gluteus maximus;Piriformis  bilateral lumbar multifidi   Gluteus Maximus Response Twitch response elicited;Palpable increased muscle length   Gluteus Minimus Response Twitch response elicited;Palpable increased muscle length   Piriformis Response Twitch response elicited;Palpable increased muscle length       Dry needling performed bilaterally.  IFC  electrical stimulation with moist heat intensity to patient tolerance 15 min to decrease pain.       PT Education - 10/10/15 1157    Education provided Yes   Education Details lumbar rotation;  piriformis stretch;  dry needling after care   Person(s) Educated Patient   Methods Explanation;Demonstration;Handout   Comprehension Verbalized understanding          PT Short Term Goals - 10/10/15 1203      PT SHORT TERM GOAL #1   Title independent with initial HEP   Time 4   Period Weeks   Status On-going     PT SHORT TERM GOAL #2   Title understand correct body mechanics and sleeping positions to reduce strain on back and hips   Time 4   Period Weeks   Status On-going     PT SHORT TERM GOAL #3   Title pain with daily activities  reduced >/= 25%   Time 4   Period Weeks   Status On-going     PT SHORT TERM GOAL #4   Title pain with sitting and walking decreased >/= 25%   Time 4   Period Weeks   Status On-going           PT Long Term Goals - 10/10/15 1203      PT LONG TERM GOAL #1   Title independent with HEP    Time 8   Period Weeks   Status On-going     PT LONG TERM GOAL #2   Title bil. hip strength >/= 4+/5 so she is able to stand or walk for 1 hour in a store   Time 8   Period Weeks   Status On-going     PT LONG TERM GOAL #3   Title sit for 2 hour to see a movie with pain decreased >/= 75%   Time 8   Period Weeks   Status On-going     PT LONG TERM GOAL #4   Title drive for 30 min with pain in right leg decreased >/= 75%   Time 8   Period Weeks   Status On-going     PT LONG TERM GOAL #5   Title FOTO score </= 43% limitation   Time 8   Period Weeks   Status On-going     PT LONG TERM GOAL #6   Title be able to perform daily home tasks with >/= 75% greater ease due to reduction in pain   Time 8   Period Weeks   Status On-going     PT LONG TERM GOAL #7   Title shave legs with >/= 75% greater ease due to increase mobility and reduction in pain   Time 8   Period Weeks   Status On-going               Plan - 10/10/15 1158    Clinical Impression Statement The patient has multiple tender points in right > left gluteals and piriformis.  She is receptive to dry needling and manual therapy.  Improved soft tissue muscle length following treatment session.  Good pain relief from e-stim/heat.  Therapist closely monitoring response with all interventions.     PT Next Visit Plan assess response to DN#1;  start ionto patch trial;  review stretches as need and add hip flexor and ITB to HEP;  soft tissue work/ hip joint mobs      Patient will benefit from skilled therapeutic intervention in order to improve  the following deficits and impairments:     Visit Diagnosis: Muscle weakness  (generalized)  Pain in right hip  Pain in left hip  Midline low back pain without sciatica     Problem List Patient Active Problem List   Diagnosis Date Noted  . Severe recurrent major depression without psychotic features ([place])   . Suicidal ideation   . Chronic cholecystitis with calculus 09/27/2011  . Hx of colon cancer, stage II 08/24/2011  . Hyperlipidemia 08/24/2011  . Anxiety and depression 08/24/2011   Ruben Im, PT 10/10/15 12:05 PM Phone: [phone] Fax: [phone]  Alvera Singh 10/10/2015, 12:05 PM  [facility] [address] [place], [place], [address] Phone: [phone]   Fax:  [phone]  Name: Lorraine May MRN: [id number] Date of Birth: May 31, 1955

## 2015-10-13 ENCOUNTER — Encounter: Payer: Self-pay | Admitting: Physical Therapy

## 2015-10-13 DIAGNOSIS — N390 Urinary tract infection, site not specified: Secondary | ICD-10-CM | POA: Diagnosis not present

## 2015-10-16 ENCOUNTER — Ambulatory Visit: Payer: BLUE CROSS/BLUE SHIELD

## 2015-10-16 DIAGNOSIS — M545 Low back pain, unspecified: Secondary | ICD-10-CM

## 2015-10-16 DIAGNOSIS — M25552 Pain in left hip: Secondary | ICD-10-CM | POA: Diagnosis not present

## 2015-10-16 DIAGNOSIS — M25551 Pain in right hip: Secondary | ICD-10-CM

## 2015-10-16 DIAGNOSIS — M6281 Muscle weakness (generalized): Secondary | ICD-10-CM | POA: Diagnosis not present

## 2015-10-16 NOTE — Patient Instructions (Addendum)
  HIP: Hamstrings - Short Sitting    Rest leg on raised surface. Keep knee straight. Lift chest. Hold _20__ seconds. __3_ reps per set, _3__ sets per day, __7_ days per week  Copyright  VHI. All rights reserved.   Piriformis Stretch, Sitting    Sit, one ankle on opposite knee, same-side hand on crossed knee. Push down on knee, keeping spine straight. Lean torso forward, with flat back, until tension is felt in hamstrings and gluteals of crossed-leg side. Hold _20__ seconds.  Repeat __3_ times per session. Do _3__ sessions per day.  Copyright  VHI. All rights reserved.  [facility] [address] [place], [place] [address] Phone # [phone] Fax [phone]

## 2015-10-16 NOTE — Therapy (Signed)
[facility] [address] [place], [place], [address] Phone: [phone]   Fax:  [phone]  Physical Therapy Treatment  Patient Details  Name: Lorraine May MRN: [id number] Date of Birth: Feb 20, 1955 Referring Provider: Dr. Darcus Austin  Encounter Date: 10/16/2015      PT End of Session - 10/16/15 1056    Visit Number 3   Date for PT Re-Evaluation 12/03/15   PT Start Time [id number]   PT Stop Time 1115   PT Time Calculation (min) 60 min   Activity Tolerance Patient tolerated treatment well   Behavior During Therapy [facility] for tasks assessed/performed      Past Medical History:  Diagnosis Date  . Anxiety   . Anxiety and depression 08/24/2011  . Arthritis   . Cancer ([place])    colon  . Depression   . Hx of colon cancer, stage II 08/24/2011   T3N0  adenoca cecum resected 04/2004  Xeloda adjuvant chemo  . Hyperlipidemia 08/24/2011  . Joint pain   . Nausea & vomiting   . PONV (postoperative nausea and vomiting)     Past Surgical History:  Procedure Laterality Date  . ABDOMINAL HYSTERECTOMY  2004  . BREAST SURGERY  2011   reduction  . CHOLECYSTECTOMY  10/05/2011   Procedure: LAPAROSCOPIC CHOLECYSTECTOMY WITH INTRAOPERATIVE CHOLANGIOGRAM;  Surgeon: Stark Klein, MD;  Location: [place];  Service: General;  Laterality: N/A;  . [place]  . HEMICOLECTOMY Right 2006  . HERNIA REPAIR  2007  . KNEE ARTHROSCOPY  2010  . NASAL SEPTUM SURGERY  1983  . OTHER SURGICAL HISTORY  2016   Transcranial magnetic stimulation  . PORT-A-CATH REMOVAL  2006   insertion and removal  . STRABISMUS SURGERY  1962    There were no vitals filed for this visit.      Subjective Assessment - 10/16/15 1020    Subjective I felt great after the needling.  Then had a UTI and had to have a tooth pulled so tensed back up.     Currently in Pain? Yes   Pain Location Back   Pain Orientation Right;Left   Pain Descriptors /  Indicators Aching   Pain Type Chronic pain   Pain Radiating Towards Rt buttock   Pain Onset More than a month ago   Pain Frequency Intermittent   Aggravating Factors  laying on side, sitting in car/driving, standing too long   Pain Relieving Factors heat, massage                         [facility] Adult PT Treatment/Exercise - 10/16/15 0001      Knee/Hip Exercises: Stretches   Active Hamstring Stretch Both;3 reps;20 seconds   Piriformis Stretch 3 reps;20 seconds;Both   Other Knee/Hip Stretches lumbar rotation 20 sec 2 x right and left   Other Knee/Hip Stretches supine piriformis      Moist Heat Therapy   Number Minutes Moist Heat 15 Minutes   Moist Heat Location Lumbar Spine     Electrical Stimulation   Electrical Stimulation Location BIl gluteals and low back   Electrical Stimulation Action IFC   Electrical Stimulation Parameters 15 minutes    Electrical Stimulation Goals Pain     Manual Therapy   Manual Therapy Soft tissue mobilization;Myofascial release   Manual therapy comments soft tissue elongation to bil. gluteals and bul. lumbar paraspinals with trigger point release  Trigger Point Dry Needling - 10/16/15 1043    Consent Given? Yes   Muscles Treated Lower Body Gluteus minimus;Gluteus maximus;Piriformis  lumbar multifidi bil.    Gluteus Maximus Response Twitch response elicited;Palpable increased muscle length   Gluteus Minimus Response Twitch response elicited;Palpable increased muscle length   Piriformis Response Twitch response elicited;Palpable increased muscle length              PT Education - 10/16/15 1055    Education provided Yes   Education Details seated hamstring and piriformis stretch   Person(s) Educated Patient   Methods Explanation;Demonstration;Handout   Comprehension Verbalized understanding;Returned demonstration          PT Short Term Goals - 10/16/15 1022      PT SHORT TERM GOAL #1   Title independent  with initial HEP   Status Achieved     PT SHORT TERM GOAL #2   Title understand correct body mechanics and sleeping positions to reduce strain on back and hips   Time --   Period --   Status Achieved     PT SHORT TERM GOAL #3   Title pain with daily activities reduced >/= 25%   Time 4   Period Weeks   Status On-going     PT SHORT TERM GOAL #4   Title pain with sitting and walking decreased >/= 25%   Time 4   Period Weeks   Status On-going           PT Long Term Goals - 10/10/15 1203      PT LONG TERM GOAL #1   Title independent with HEP    Time 8   Period Weeks   Status On-going     PT LONG TERM GOAL #2   Title bil. hip strength >/= 4+/5 so she is able to stand or walk for 1 hour in a store   Time 8   Period Weeks   Status On-going     PT LONG TERM GOAL #3   Title sit for 2 hour to see a movie with pain decreased >/= 75%   Time 8   Period Weeks   Status On-going     PT LONG TERM GOAL #4   Title drive for 30 min with pain in right leg decreased >/= 75%   Time 8   Period Weeks   Status On-going     PT LONG TERM GOAL #5   Title FOTO score </= 43% limitation   Time 8   Period Weeks   Status On-going     PT LONG TERM GOAL #6   Title be able to perform daily home tasks with >/= 75% greater ease due to reduction in pain   Time 8   Period Weeks   Status On-going     PT LONG TERM GOAL #7   Title shave legs with >/= 75% greater ease due to increase mobility and reduction in pain   Time 8   Period Weeks   Status On-going               Plan - 10/16/15 1023    Clinical Impression Statement Pt was doing well after needling last session and then had tension with UTI and dental procedure.  Pt reports 20% overall improvement since the start of care.  Pt has received body mechanics education and is making modifications at home.  Pt with tension and trigger points in bil. low back and Lt>Rt gluteals.  Pt with improved  mobilty and tissue length after  needling today.  Pt will continue to benefit from skilled PT for manual, needling, flexiblity, strength and modalities.     Rehab Potential Excellent   PT Frequency 2x / week   PT Duration 8 weeks   PT Treatment/Interventions ADLs/Self Care Home Management;Cryotherapy;Electrical Stimulation;Iontophoresis 4mg /ml Dexamethasone;Moist Heat;Therapeutic activities;Therapeutic exercise;Neuromuscular re-education;Patient/family education;Passive range of motion;Manual techniques;Dry needling   PT Next Visit Plan assess response to DN#2;  start ionto patch trial;  review stretches as need and add hip flexor and ITB to HEP;  soft tissue work/ hip joint mobs.  Continue e-stim and heat   Consulted and Agree with Plan of Care Patient      Patient will benefit from skilled therapeutic intervention in order to improve the following deficits and impairments:  Pain, Decreased strength, Decreased mobility, Decreased balance, Impaired flexibility, Improper body mechanics, Decreased activity tolerance, Decreased endurance, Decreased range of motion  Visit Diagnosis: Muscle weakness (generalized)  Pain in right hip  Pain in left hip  Midline low back pain without sciatica     Problem List Patient Active Problem List   Diagnosis Date Noted  . Severe recurrent major depression without psychotic features ([place])   . Suicidal ideation   . Chronic cholecystitis with calculus 09/27/2011  . Hx of colon cancer, stage II 08/24/2011  . Hyperlipidemia 08/24/2011  . Anxiety and depression 08/24/2011     Sigurd Sos, PT 10/16/15 11:05 AM  [facility] [address] [place], [place], [address] Phone: [phone]   Fax:  [phone]  Name: GENESEE AFFELDT MRN: [id number] Date of Birth: 1955/12/23

## 2015-10-20 ENCOUNTER — Ambulatory Visit: Payer: BLUE CROSS/BLUE SHIELD

## 2015-10-20 DIAGNOSIS — M25551 Pain in right hip: Secondary | ICD-10-CM | POA: Diagnosis not present

## 2015-10-20 DIAGNOSIS — M6281 Muscle weakness (generalized): Secondary | ICD-10-CM | POA: Diagnosis not present

## 2015-10-20 DIAGNOSIS — M545 Low back pain, unspecified: Secondary | ICD-10-CM

## 2015-10-20 DIAGNOSIS — M25552 Pain in left hip: Secondary | ICD-10-CM

## 2015-10-20 NOTE — Therapy (Signed)
[facility] [address] [place], [place], [address] Phone: [phone]   Fax:  [phone]  Physical Therapy Treatment  Patient Details  Name: Lorraine May MRN: [id number] Date of Birth: 10/18/1955 Referring Provider: Dr. Darcus Austin  Encounter Date: 10/20/2015      PT End of Session - 10/20/15 1144    Visit Number 4   Date for PT Re-Evaluation 12/03/15   PT Start Time 1100   PT Stop Time 1159   PT Time Calculation (min) 59 min   Activity Tolerance Patient tolerated treatment well   Behavior During Therapy [facility] for tasks assessed/performed      Past Medical History:  Diagnosis Date  . Anxiety   . Anxiety and depression 08/24/2011  . Arthritis   . Cancer ([place])    colon  . Depression   . Hx of colon cancer, stage II 08/24/2011   T3N0  adenoca cecum resected 04/2004  Xeloda adjuvant chemo  . Hyperlipidemia 08/24/2011  . Joint pain   . Nausea & vomiting   . PONV (postoperative nausea and vomiting)     Past Surgical History:  Procedure Laterality Date  . ABDOMINAL HYSTERECTOMY  2004  . BREAST SURGERY  2011   reduction  . CHOLECYSTECTOMY  10/05/2011   Procedure: LAPAROSCOPIC CHOLECYSTECTOMY WITH INTRAOPERATIVE CHOLANGIOGRAM;  Surgeon: Stark Klein, MD;  Location: [place];  Service: General;  Laterality: N/A;  . [place]  . HEMICOLECTOMY Right 2006  . HERNIA REPAIR  2007  . KNEE ARTHROSCOPY  2010  . NASAL SEPTUM SURGERY  1983  . OTHER SURGICAL HISTORY  2016   Transcranial magnetic stimulation  . PORT-A-CATH REMOVAL  2006   insertion and removal  . STRABISMUS SURGERY  1962    There were no vitals filed for this visit.      Subjective Assessment - 10/20/15 1104    Subjective Pt had soreness for 1 day after needling and then felt much better.     Currently in Pain? Yes   Pain Score 5    Pain Location Back   Pain Orientation Right;Left   Pain Descriptors / Indicators Aching   Pain Type Chronic pain   Pain Onset More than a month ago   Pain Frequency Intermittent   Aggravating Factors  getting out of the car after sitting in car/driving, standing too long   Pain Relieving Factors heat, massage                         [facility] Adult PT Treatment/Exercise - 10/20/15 0001      Knee/Hip Exercises: Stretches   Active Hamstring Stretch Both;3 reps;20 seconds   Piriformis Stretch 3 reps;20 seconds;Both   Other Knee/Hip Stretches lumbar rotation 20 sec 2 x right and left   Other Knee/Hip Stretches supine piriformis      Moist Heat Therapy   Number Minutes Moist Heat 15 Minutes   Moist Heat Location Lumbar Spine     Electrical Stimulation   Electrical Stimulation Location BIl gluteals and low back   Electrical Stimulation Action IFC   Electrical Stimulation Parameters 15 minutes   Electrical Stimulation Goals Pain     Manual Therapy   Manual Therapy Soft tissue mobilization;Myofascial release   Manual therapy comments soft tissue elongation to bil. gluteals and bil. lumbar paraspinals with trigger point release           Trigger Point Dry Needling - 10/20/15  1109    Consent Given? Yes   Muscles Treated Lower Body Gluteus minimus;Piriformis  bil lumbar multifidi   Gluteus Maximus Response Twitch response elicited;Palpable increased muscle length   Gluteus Minimus Response Twitch response elicited;Palpable increased muscle length   Piriformis Response Twitch response elicited;Palpable increased muscle length                PT Short Term Goals - 10/20/15 1106      PT SHORT TERM GOAL #1   Title independent with initial HEP   Status Achieved     PT SHORT TERM GOAL #2   Title understand correct body mechanics and sleeping positions to reduce strain on back and hips   Status Achieved     PT SHORT TERM GOAL #3   Title pain with daily activities reduced >/= 25%   Status Achieved     PT SHORT TERM GOAL #4   Title pain with  sitting and walking decreased >/= 25%   Status Achieved           PT Long Term Goals - 10/10/15 1203      PT LONG TERM GOAL #1   Title independent with HEP    Time 8   Period Weeks   Status On-going     PT LONG TERM GOAL #2   Title bil. hip strength >/= 4+/5 so she is able to stand or walk for 1 hour in a store   Time 8   Period Weeks   Status On-going     PT LONG TERM GOAL #3   Title sit for 2 hour to see a movie with pain decreased >/= 75%   Time 8   Period Weeks   Status On-going     PT LONG TERM GOAL #4   Title drive for 30 min with pain in right leg decreased >/= 75%   Time 8   Period Weeks   Status On-going     PT LONG TERM GOAL #5   Title FOTO score </= 43% limitation   Time 8   Period Weeks   Status On-going     PT LONG TERM GOAL #6   Title be able to perform daily home tasks with >/= 75% greater ease due to reduction in pain   Time 8   Period Weeks   Status On-going     PT LONG TERM GOAL #7   Title shave legs with >/= 75% greater ease due to increase mobility and reduction in pain   Time 8   Period Weeks   Status On-going               Plan - 10/20/15 1106    Clinical Impression Statement Pt reports 65% overall reduction in the frequency and intensity of pain overall since the start of care.  Pt responded well to dry needling last session.  Pt is making body mechanics modifications at home.  Pt with tension and trigger points in bil low back and bil. gluteals.  Pt with improved tissue length and mobility after needling today.  Pt will continue to benefit from skilled PT for manual, needling, flexibility, strength and modalities.     Rehab Potential Excellent   PT Frequency 2x / week   PT Duration 8 weeks   PT Treatment/Interventions ADLs/Self Care Home Management;Cryotherapy;Electrical Stimulation;Iontophoresis 4mg /ml Dexamethasone;Moist Heat;Therapeutic activities;Therapeutic exercise;Neuromuscular re-education;Patient/family  education;Passive range of motion;Manual techniques;Dry needling   PT Next Visit Plan assess response to DN#3;  start ionto patch  trial;  review stretches as need and add hip flexor and ITB to HEP;  soft tissue work/ hip joint mobs.  Continue e-stim and heat   Consulted and Agree with Plan of Care Patient      Patient will benefit from skilled therapeutic intervention in order to improve the following deficits and impairments:  Pain, Decreased strength, Decreased mobility, Decreased balance, Impaired flexibility, Improper body mechanics, Decreased activity tolerance, Decreased endurance, Decreased range of motion  Visit Diagnosis: Muscle weakness (generalized)  Pain in right hip  Pain in left hip  Midline low back pain without sciatica     Problem List Patient Active Problem List   Diagnosis Date Noted  . Severe recurrent major depression without psychotic features ([place])   . Suicidal ideation   . Chronic cholecystitis with calculus 09/27/2011  . Hx of colon cancer, stage II 08/24/2011  . Hyperlipidemia 08/24/2011  . Anxiety and depression 08/24/2011    Sigurd Sos, PT 10/20/15 11:45 AM  [facility] [address] [place], [place], [address] Phone: [phone]   Fax:  [phone]  Name: Lorraine May MRN: [id number] Date of Birth: 02-15-1955

## 2015-10-21 DIAGNOSIS — F341 Dysthymic disorder: Secondary | ICD-10-CM | POA: Diagnosis not present

## 2015-10-21 DIAGNOSIS — F411 Generalized anxiety disorder: Secondary | ICD-10-CM | POA: Diagnosis not present

## 2015-10-21 DIAGNOSIS — F606 Avoidant personality disorder: Secondary | ICD-10-CM | POA: Diagnosis not present

## 2015-10-21 DIAGNOSIS — R319 Hematuria, unspecified: Secondary | ICD-10-CM | POA: Diagnosis not present

## 2015-10-21 DIAGNOSIS — F4310 Post-traumatic stress disorder, unspecified: Secondary | ICD-10-CM | POA: Diagnosis not present

## 2015-10-21 DIAGNOSIS — N390 Urinary tract infection, site not specified: Secondary | ICD-10-CM | POA: Diagnosis not present

## 2015-10-23 ENCOUNTER — Ambulatory Visit: Payer: BLUE CROSS/BLUE SHIELD | Admitting: Physical Therapy

## 2015-10-23 ENCOUNTER — Encounter: Payer: Self-pay | Admitting: Physical Therapy

## 2015-10-23 DIAGNOSIS — M545 Low back pain, unspecified: Secondary | ICD-10-CM

## 2015-10-23 DIAGNOSIS — M6281 Muscle weakness (generalized): Secondary | ICD-10-CM | POA: Diagnosis not present

## 2015-10-23 DIAGNOSIS — M25551 Pain in right hip: Secondary | ICD-10-CM

## 2015-10-23 DIAGNOSIS — M25552 Pain in left hip: Secondary | ICD-10-CM

## 2015-10-23 NOTE — Therapy (Signed)
[facility] [address] [place], [place], [address] Phone: [phone]   Fax:  [phone]  Physical Therapy Treatment  Patient Details  Name: Lorraine May MRN: [id number] Date of Birth: Jul 16, 1955 Referring Provider: Dr. Darcus Austin  Encounter Date: 10/23/2015      PT End of Session - 10/23/15 1128    Visit Number 5   Date for PT Re-Evaluation 12/03/15   PT Start Time 1100   PT Stop Time 1200   PT Time Calculation (min) 60 min   Activity Tolerance Patient tolerated treatment well   Behavior During Therapy [facility] for tasks assessed/performed      Past Medical History:  Diagnosis Date  . Anxiety   . Anxiety and depression 08/24/2011  . Arthritis   . Cancer ([place])    colon  . Depression   . Hx of colon cancer, stage II 08/24/2011   T3N0  adenoca cecum resected 04/2004  Xeloda adjuvant chemo  . Hyperlipidemia 08/24/2011  . Joint pain   . Nausea & vomiting   . PONV (postoperative nausea and vomiting)     Past Surgical History:  Procedure Laterality Date  . ABDOMINAL HYSTERECTOMY  2004  . BREAST SURGERY  2011   reduction  . CHOLECYSTECTOMY  10/05/2011   Procedure: LAPAROSCOPIC CHOLECYSTECTOMY WITH INTRAOPERATIVE CHOLANGIOGRAM;  Surgeon: Stark Klein, MD;  Location: [name];  Service: General;  Laterality: N/A;  . [place]  . HEMICOLECTOMY Right 2006  . HERNIA REPAIR  2007  . KNEE ARTHROSCOPY  2010  . NASAL SEPTUM SURGERY  1983  . OTHER SURGICAL HISTORY  2016   Transcranial magnetic stimulation  . PORT-A-CATH REMOVAL  2006   insertion and removal  . STRABISMUS SURGERY  1962    There were no vitals filed for this visit.      Subjective Assessment - 10/23/15 1103    Subjective Last dry needling did not give me as much releif. Getting in and out of the car is better.    Pertinent History mild to moderate kidney disease   Limitations Sitting;Standing   Patient Stated Goals exercises  at home to manage pain and resolve   Currently in Pain? Yes   Pain Score 6    Pain Location Back   Pain Orientation Right;Left   Pain Descriptors / Indicators Dull   Pain Type Chronic pain   Pain Onset More than a month ago   Pain Frequency Intermittent   Aggravating Factors  making up the bed; doing alot in the morning;    Pain Relieving Factors heat and massage   Multiple Pain Sites No                         [facility] Adult PT Treatment/Exercise - 10/23/15 0001      Lumbar Exercises: Supine   Ab Set 5 seconds;5 reps  tactile cues to depress rib cage   Clam 10 reps  bil. with abdominal bracing   Heel Slides 10 reps  alternate with abdominal bracing   Bent Knee Raise 10 reps  alternate; abdominal bracing     Knee/Hip Exercises: Stretches   Active Hamstring Stretch Both;3 reps;20 seconds   Other Knee/Hip Stretches lumbar rotation 20 sec 2 x right and left   Other Knee/Hip Stretches supine piriformis      Knee/Hip Exercises: Supine   Bridges 20 reps  going up and down slowly     Knee/Hip  Exercises: Sidelying   Hip ADduction Right;Left;Strengthening;2 sets;10 reps  therapist guiding patient in painfree range     Modalities   Modalities Electrical Stimulation;Moist Heat     Moist Heat Therapy   Number Minutes Moist Heat 15 Minutes   Moist Heat Location Lumbar Spine     Electrical Stimulation   Electrical Stimulation Location BIl gluteals and low back   Electrical Stimulation Action IFC   Electrical Stimulation Parameters 15 min, to patient tolerance   Electrical Stimulation Goals Pain     Manual Therapy   Manual Therapy Joint mobilization;Passive ROM   Manual therapy comments stretche hip flexors and rotators   Joint Mobilization anterior glide of bil. hips grade 3 in prone   Passive ROM bil. hip extension in prone                PT Education - 10/23/15 1128    Education provided Yes   Education Details transverse abdominus exercise  series   Person(s) Educated Patient   Methods Explanation;Demonstration;Handout   Comprehension Verbalized understanding;Returned demonstration          PT Short Term Goals - 10/20/15 1106      PT SHORT TERM GOAL #1   Title independent with initial HEP   Status Achieved     PT SHORT TERM GOAL #2   Title understand correct body mechanics and sleeping positions to reduce strain on back and hips   Status Achieved     PT SHORT TERM GOAL #3   Title pain with daily activities reduced >/= 25%   Status Achieved     PT SHORT TERM GOAL #4   Title pain with sitting and walking decreased >/= 25%   Status Achieved           PT Long Term Goals - 10/10/15 1203      PT LONG TERM GOAL #1   Title independent with HEP    Time 8   Period Weeks   Status On-going     PT LONG TERM GOAL #2   Title bil. hip strength >/= 4+/5 so she is able to stand or walk for 1 hour in a store   Time 8   Period Weeks   Status On-going     PT LONG TERM GOAL #3   Title sit for 2 hour to see a movie with pain decreased >/= 75%   Time 8   Period Weeks   Status On-going     PT LONG TERM GOAL #4   Title drive for 30 min with pain in right leg decreased >/= 75%   Time 8   Period Weeks   Status On-going     PT LONG TERM GOAL #5   Title FOTO score </= 43% limitation   Time 8   Period Weeks   Status On-going     PT LONG TERM GOAL #6   Title be able to perform daily home tasks with >/= 75% greater ease due to reduction in pain   Time 8   Period Weeks   Status On-going     PT LONG TERM GOAL #7   Title shave legs with >/= 75% greater ease due to increase mobility and reduction in pain   Time 8   Period Weeks   Status On-going               Plan - 10/23/15 1130    Clinical Impression Statement Patient reports she did not feel as good with  this last dry needling compared to other.  Patient needed tactile cues to engage core with transverse abdominus series. Patient did not have ionto to  hips due to not complaining about hips. Patient reports laying on hips with 75% decrease in pain.  Patient is getting  massages weekly by a massage therapist. Patient has increased in hip extension with  bridges after manual work. Patient will benefit from skilled therapy for manual, needling, flexibility, strength and modalities.    Rehab Potential Excellent   Clinical Impairments Affecting Rehab Potential NOne   PT Frequency 2x / week   PT Duration 8 weeks   PT Treatment/Interventions ADLs/Self Care Home Management;Cryotherapy;Electrical Stimulation;Iontophoresis 4mg /ml Dexamethasone;Moist Heat;Therapeutic activities;Therapeutic exercise;Neuromuscular re-education;Patient/family education;Passive range of motion;Manual techniques;Dry needling   PT Next Visit Plan start ionto patch trial if complain about hips;  review stretches as need and add hip flexor and ITB to HEP;  soft tissue work/ hip joint mobs.  work on hip and core strength; Continue e-stim and heat   PT Home Exercise Plan hip strengthening   Consulted and Agree with Plan of Care Patient      Patient will benefit from skilled therapeutic intervention in order to improve the following deficits and impairments:  Pain, Decreased strength, Decreased mobility, Decreased balance, Impaired flexibility, Improper body mechanics, Decreased activity tolerance, Decreased endurance, Decreased range of motion  Visit Diagnosis: Muscle weakness (generalized)  Pain in right hip  Midline low back pain without sciatica  Pain in left hip     Problem List Patient Active Problem List   Diagnosis Date Noted  . Severe recurrent major depression without psychotic features ([place])   . Suicidal ideation   . Chronic cholecystitis with calculus 09/27/2011  . Hx of colon cancer, stage II 08/24/2011  . Hyperlipidemia 08/24/2011  . Anxiety and depression 08/24/2011    Earlie Counts, PT 10/23/15 11:42 AM   [facility] [address], [place], [place], [address] Phone: [phone]   Fax:  [phone]  Name: Lorraine May MRN: [id number] Date of Birth: Jun 06, 1955

## 2015-10-23 NOTE — Patient Instructions (Signed)
Lower abdominal/core stability exercises  1. Practice your breathing technique: Inhale through your nose expanding your belly and rib cage. Try not to breathe into your chest. Exhale slowly and gradually out your mouth feeling a sense of softness to your body. Practice multiple times. This can be performed unlimited.  2. Finding the lower abdominals. Laying on your back with the knees bent, place your fingers just below your belly button. Using your breathing technique from above, on your exhale gently pull the belly button away from your fingertips without tensing any other muscles. Practice this 5x. Next, as you exhale, draw belly button inwards and hold onto it...then feel as if you are pulling that muscle across your pelvis like you are tightening a belt. This can be hard to do at first so be patient and practice. Do 5-10 reps 1-3 x day. Always recognize quality over quantity; if your abdominal muscles become tired you will notice you may tighten/contract other muscles. This is the time to take a break.   Practice this first laying on your back, then in sitting, progressing to standing and finally adding it to all your daily movements.   3. Finding your pelvic floor. Using the breathing technique above, when your exhale, this time draw your pelvic floor muscles up as if you were attempting to stop the flow of urination. Be careful NOT to tense any other muscles. This can be hard, BE PATIENT. Try to hold up to 10 seconds repeating 10x. Try 2x a day. Once you feel you are doing this well, add this contraction to exercise #2. First contracting your pelvic floor followed by lower abdominals.   4. Adding leg movements. Add the following leg movements to challenge your ability to keep your core stable:  1. Single leg drop outs: Laying on your back with knees bent feet flat. Inhale,  dropping one knee outward KEEPING YOUR PELVIS STILL. Exhale as you bring the leg back, simultaneously performing your lower  abdominal contraction. Do 5-10 on each leg.   2. Marching: While keeping your pelvis still, lift the right foot a few inches, put it down then lift left foot. This will mimic a march. Start slow to establish control. Once you have control you may speed it up. Do 10-20x. You MUST keep your lower abdominlas contracted while you march. Breathe naturally    3. Single leg slides: Inhale while you slowly slide one leg out keeping your pelvis still. Only slide your leg as far as you can keep your pelvis still. Exhale as you bring the leg back to the start, contracting the lower abdominals as you do that. Keep your upper body relaxed. Do 5-10 on each side.    [facility] [address], [address] [place], [place] [address] Phone # [phone] Fax [phone]       

## 2015-10-27 ENCOUNTER — Ambulatory Visit: Payer: BLUE CROSS/BLUE SHIELD

## 2015-10-27 DIAGNOSIS — M6281 Muscle weakness (generalized): Secondary | ICD-10-CM | POA: Diagnosis not present

## 2015-10-27 DIAGNOSIS — M545 Low back pain, unspecified: Secondary | ICD-10-CM

## 2015-10-27 DIAGNOSIS — M25552 Pain in left hip: Secondary | ICD-10-CM | POA: Diagnosis not present

## 2015-10-27 DIAGNOSIS — M25551 Pain in right hip: Secondary | ICD-10-CM

## 2015-10-27 NOTE — Therapy (Signed)
[facility] [address], [place], [place], [address] Phone: [phone]   Fax:  [phone]  Physical Therapy Treatment  Patient Details  Name: Lorraine May MRN: [id number] Date of Birth: 1955-06-28 Referring Provider: Dr. Darcus Austin  Encounter Date: 10/27/2015      PT End of Session - 10/27/15 1307    Visit Number 6   Date for PT Re-Evaluation 12/03/15   PT Start Time 1232   PT Stop Time 1320  dry needling   PT Time Calculation (min) 48 min   Activity Tolerance Patient tolerated treatment well   Behavior During Therapy [facility] for tasks assessed/performed      Past Medical History:  Diagnosis Date  . Anxiety   . Anxiety and depression 08/24/2011  . Arthritis   . Cancer ([place])    colon  . Depression   . Hx of colon cancer, stage II 08/24/2011   T3N0  adenoca cecum resected 04/2004  Xeloda adjuvant chemo  . Hyperlipidemia 08/24/2011  . Joint pain   . Nausea & vomiting   . PONV (postoperative nausea and vomiting)     Past Surgical History:  Procedure Laterality Date  . ABDOMINAL HYSTERECTOMY  2004  . BREAST SURGERY  2011   reduction  . CHOLECYSTECTOMY  10/05/2011   Procedure: LAPAROSCOPIC CHOLECYSTECTOMY WITH INTRAOPERATIVE CHOLANGIOGRAM;  Surgeon: Stark Klein, MD;  Location: [facility];  Service: General;  Laterality: N/A;  . [place]  . HEMICOLECTOMY Right 2006  . HERNIA REPAIR  2007  . KNEE ARTHROSCOPY  2010  . NASAL SEPTUM SURGERY  1983  . OTHER SURGICAL HISTORY  2016   Transcranial magnetic stimulation  . PORT-A-CATH REMOVAL  2006   insertion and removal  . STRABISMUS SURGERY  1962    There were no vitals filed for this visit.      Subjective Assessment - 10/27/15 1234    Subjective Pt reports that LBP started yesterday afternoon and still with pain today.     Currently in Pain? Yes   Pain Score 7    Pain Location Back   Pain Orientation Right   Pain Descriptors /  Indicators Aching   Pain Type Chronic pain   Pain Onset More than a month ago   Pain Frequency Intermittent   Aggravating Factors  standing long periods, activity   Pain Relieving Factors heat, manual therapy, dry needling                         [facility] Adult PT Treatment/Exercise - 10/27/15 0001      Exercises   Exercises Knee/Hip;Lumbar     Lumbar Exercises: Stretches   Single Knee to Chest Stretch 3 reps;20 seconds   Piriformis Stretch 3 reps;20 seconds     Lumbar Exercises: Aerobic   Stationary Bike Level 1 x 8 minutes  PT present for pain and goal assessment     Modalities   Modalities Electrical Stimulation;Moist Heat     Moist Heat Therapy   Number Minutes Moist Heat 15 Minutes   Moist Heat Location Lumbar Spine     Electrical Stimulation   Electrical Stimulation Location Bil gluteals and low back   Electrical Stimulation Action IFC   Electrical Stimulation Parameters 15 minutes   Electrical Stimulation Goals Pain     Manual Therapy   Manual Therapy Soft tissue mobilization;Myofascial release   Manual therapy comments soft tissue elongation to bil. gluteals and bil.  lumbar paraspinals with trigger point release           Trigger Point Dry Needling - 10/27/15 1242    Consent Given? Yes   Muscles Treated Lower Body Gluteus minimus;Piriformis  bilateral lumbar multifidi   Gluteus Minimus Response Twitch response elicited;Palpable increased muscle length   Piriformis Response Twitch response elicited;Palpable increased muscle length                PT Short Term Goals - 10/20/15 1106      PT SHORT TERM GOAL #1   Title independent with initial HEP   Status Achieved     PT SHORT TERM GOAL #2   Title understand correct body mechanics and sleeping positions to reduce strain on back and hips   Status Achieved     PT SHORT TERM GOAL #3   Title pain with daily activities reduced >/= 25%   Status Achieved     PT SHORT TERM GOAL #4    Title pain with sitting and walking decreased >/= 25%   Status Achieved           PT Long Term Goals - 10/27/15 1236      PT LONG TERM GOAL #1   Title independent with HEP    Time 8   Period Weeks   Status On-going     PT LONG TERM GOAL #3   Title sit for 2 hour to see a movie with pain decreased >/= 75%   Time 8   Period Weeks   Status On-going     PT LONG TERM GOAL #4   Title drive for 30 min with pain in right leg decreased >/= 75%   Time 8   Period Weeks   Status On-going               Plan - 10/27/15 1237    Clinical Impression Statement Pt with a flare-up of pain in the low back over the past couple of days.  Pt with limited ability to exercise due to pain.  Pt with trigger points in the bil. gluteals and pain in the lumbar spine.  Pt tolerated dry neelding well today with improved tissue mobility and reduced pain after treatement today.  Pt will continue to benefit from skilled PT for manual, dry needling, flexiblity and strength.  Modalities as needed for pain.     Rehab Potential Excellent   PT Frequency 2x / week   PT Duration 8 weeks   PT Treatment/Interventions ADLs/Self Care Home Management;Cryotherapy;Electrical Stimulation;Iontophoresis 4mg /ml Dexamethasone;Moist Heat;Therapeutic activities;Therapeutic exercise;Neuromuscular re-education;Patient/family education;Passive range of motion;Manual techniques;Dry needling   PT Next Visit Plan start ionto patch trial if complain about hips;  review stretches as need and add hip flexor and ITB to HEP;  soft tissue work/ hip joint mobs.  work on hip and core strength; Continue e-stim and heat   Consulted and Agree with Plan of Care Patient      Patient will benefit from skilled therapeutic intervention in order to improve the following deficits and impairments:  Pain, Decreased strength, Decreased mobility, Decreased balance, Impaired flexibility, Improper body mechanics, Decreased activity tolerance, Decreased  endurance, Decreased range of motion  Visit Diagnosis: Muscle weakness (generalized)  Pain in right hip  Midline low back pain without sciatica  Pain in left hip     Problem List Patient Active Problem List   Diagnosis Date Noted  . Severe recurrent major depression without psychotic features ([place])   . Suicidal ideation   .  Chronic cholecystitis with calculus 09/27/2011  . Hx of colon cancer, stage II 08/24/2011  . Hyperlipidemia 08/24/2011  . Anxiety and depression 08/24/2011     Sigurd Sos, PT 10/27/15 1:09 PM  [facility] [address], [place], [place], [address] Phone: [phone]   Fax:  [phone]  Name: Lorraine May MRN: [id number] Date of Birth: 02-May-1955

## 2015-10-30 ENCOUNTER — Ambulatory Visit: Payer: BLUE CROSS/BLUE SHIELD | Admitting: Physical Therapy

## 2015-10-30 DIAGNOSIS — M6281 Muscle weakness (generalized): Secondary | ICD-10-CM | POA: Diagnosis not present

## 2015-10-30 DIAGNOSIS — M25552 Pain in left hip: Secondary | ICD-10-CM

## 2015-10-30 DIAGNOSIS — M545 Low back pain, unspecified: Secondary | ICD-10-CM

## 2015-10-30 DIAGNOSIS — M25551 Pain in right hip: Secondary | ICD-10-CM | POA: Diagnosis not present

## 2015-10-30 NOTE — Patient Instructions (Addendum)
Abduction: Clam (Eccentric) - Side-Lying   Lie on side with knees bent.  Tip forward.  Draw in abdominals. Lift top knee, keeping feet together. Keep trunk steady. Slowly lower for 3-5 seconds. __8_ reps per set, __1_ sets per day, _7__ days per week.   Copyright  VHI. All rights reserved.  IONTOPHORESIS PATIENT PRECAUTIONS & CONTRAINDICATIONS:  . Redness under one or both electrodes can occur.  This characterized by a uniform redness that usually disappears within 12 hours of treatment. . Small pinhead size blisters may result in response to the drug.  Contact your physician if the problem persists more than 24 hours. . On rare occasions, iontophoresis therapy can result in temporary skin reactions such as rash, inflammation, irritation or burns.  The skin reactions may be the result of individual sensitivity to the ionic solution used, the condition of the skin at the start of treatment, reaction to the materials in the electrodes, allergies or sensitivity to dexamethasone, or a poor connection between the patch and your skin.  Discontinue using iontophoresis if you have any of these reactions and report to your therapist. . Remove the Patch or electrodes if you have any undue sensation of pain or burning during the treatment and report discomfort to your therapist. . Tell your Therapist if you have had known adverse reactions to the application of electrical current. . If using the Patch, the LED light will turn off when treatment is complete and the patch can be removed.  Approximate treatment time is 1-3 hours.  Remove the patch when light goes off or after 6 hours. . The Patch can be worn during normal activity, however excessive motion where the electrodes have been placed can cause poor contact between the skin and the electrode or uneven electrical current resulting in greater risk of skin irritation. Marland Kitchen Keep out of the reach of children.   . DO NOT use if you have a cardiac pacemaker  or any other electrically sensitive implanted device. . DO NOT use if you have a known sensitivity to dexamethasone. . DO NOT use during Magnetic Resonance Imaging (MRI). . DO NOT use over broken or compromised skin (e.g. sunburn, cuts, or acne) due to the increased risk of skin reaction. . DO NOT SHAVE over the area to be treated:  To establish good contact between the Patch and the skin, excessive hair may be clipped. . DO NOT place the Patch or electrodes on or over your eyes, directly over your heart, or brain. . DO NOT reuse the Patch or electrodes as this may cause burns to occur.   Lorraine May PT [facility] [address] [place], [place] [address] Phone # [phone] Fax [phone]

## 2015-10-30 NOTE — Therapy (Signed)
[facility] [address] [place], [place], [address] Phone: [phone]   Fax:  [phone]  Physical Therapy Treatment  Patient Details  Name: Lorraine May MRN: [id number] Date of Birth: 13-May-1955 Referring Provider: Dr. Darcus Austin  Encounter Date: 10/30/2015      PT End of Session - 10/30/15 1044    Visit Number 7   Date for PT Re-Evaluation 12/03/15   Authorization Type Patient states she is over her PT limits per insurance but is OK with self pay   PT Start Time 1015   PT Stop Time 1115   PT Time Calculation (min) 60 min   Activity Tolerance Patient tolerated treatment well      Past Medical History:  Diagnosis Date  . Anxiety   . Anxiety and depression 08/24/2011  . Arthritis   . Cancer ([place])    colon  . Depression   . Hx of colon cancer, stage II 08/24/2011   T3N0  adenoca cecum resected 04/2004  Xeloda adjuvant chemo  . Hyperlipidemia 08/24/2011  . Joint pain   . Nausea & vomiting   . PONV (postoperative nausea and vomiting)     Past Surgical History:  Procedure Laterality Date  . ABDOMINAL HYSTERECTOMY  2004  . BREAST SURGERY  2011   reduction  . CHOLECYSTECTOMY  10/05/2011   Procedure: LAPAROSCOPIC CHOLECYSTECTOMY WITH INTRAOPERATIVE CHOLANGIOGRAM;  Surgeon: Stark Klein, MD;  Location: [place];  Service: General;  Laterality: N/A;  . [place]  . HEMICOLECTOMY Right 2006  . HERNIA REPAIR  2007  . KNEE ARTHROSCOPY  2010  . NASAL SEPTUM SURGERY  1983  . OTHER SURGICAL HISTORY  2016   Transcranial magnetic stimulation  . PORT-A-CATH REMOVAL  2006   insertion and removal  . STRABISMUS SURGERY  1962    There were no vitals filed for this visit.      Subjective Assessment - 10/30/15 1015    Subjective Some bilateral hip pain today;  put in an offer on an house and crawled around inspecting the house yesterday.  Some difficulty sleeping.  Bought own TENS unit from  [employer].  I think the needles are effective.     Currently in Pain? Yes   Pain Score 5    Pain Location Hip   Pain Orientation Right;Left                         [facility] Adult PT Treatment/Exercise - 10/30/15 0001      Lumbar Exercises: Stretches   Piriformis Stretch 3 reps;20 seconds  using green physioball      Lumbar Exercises: Supine   Ab Set 5 seconds;5 reps  tactile cues to depress rib cage   Isometric Hip Flexion 10 reps     Lumbar Exercises: Sidelying   Clam 10 reps     Knee/Hip Exercises: Sidelying   Clams --     Modalities   Modalities Iontophoresis     Moist Heat Therapy   Number Minutes Moist Heat 15 Minutes   Moist Heat Location Lumbar Spine     Electrical Stimulation   Electrical Stimulation Location Bil gluteals and low back   Electrical Stimulation Action IFC   Electrical Stimulation Parameters 15 min intensity to tolerance   Electrical Stimulation Goals Pain     Iontophoresis   Type of Iontophoresis Dexamethasone   Location bilateral hips   Dose 4 ml/ma #1   Time  4-6 hour patch     Manual Therapy   Joint Mobilization right hip long axis distraction, inferior, PA in IR grade 3 3x 30 sec;  neutral gapping grade 3 3x 20 sec                PT Education - 10/30/15 1044    Education provided Yes   Education Details clams   Person(s) Educated Patient   Methods Explanation;Demonstration;Handout   Comprehension Verbalized understanding;Returned demonstration          PT Short Term Goals - 10/30/15 2325      PT SHORT TERM GOAL #1   Title independent with initial HEP   Status Achieved     PT SHORT TERM GOAL #2   Title understand correct body mechanics and sleeping positions to reduce strain on back and hips   Status Achieved     PT SHORT TERM GOAL #3   Title pain with daily activities reduced >/= 25%   Status Achieved     PT SHORT TERM GOAL #4   Title pain with sitting and walking decreased >/= 25%   Status  Achieved           PT Long Term Goals - 10/30/15 2326      PT LONG TERM GOAL #1   Title independent with HEP    Time 8   Period Weeks   Status On-going     PT LONG TERM GOAL #2   Title bil. hip strength >/= 4+/5 so she is able to stand or walk for 1 hour in a store   Time 8   Period Weeks   Status On-going     PT LONG TERM GOAL #3   Title sit for 2 hour to see a movie with pain decreased >/= 75%   Time 8   Period Weeks   Status On-going     PT LONG TERM GOAL #4   Title drive for 30 min with pain in right leg decreased >/= 75%   Time 8   Period Weeks   Status On-going     PT LONG TERM GOAL #5   Title FOTO score </= 43% limitation   Time 8   Period Weeks   Status On-going     PT LONG TERM GOAL #6   Title be able to perform daily home tasks with >/= 75% greater ease due to reduction in pain   Time 8   Period Weeks   Status On-going     PT LONG TERM GOAL #7   Title shave legs with >/= 75% greater ease due to increase mobility and reduction in pain   Time 8   Period Weeks   Status On-going               Plan - 10/30/15 2321    Clinical Impression Statement The patient reports she is improving overall with PT and although dry needling has been uncomfortable she feels it is a big part of her improvement.  She has difficulty activating transverse abdominals and weak gluteals bilaterally.  Verbal and tactile cues for activation of these muscles.  Good pain relief from e-stim/heat.     PT Next Visit Plan assess response to ionto patch #1 and continue;  dry needling;  hip and core strength;  add hip flexor and ITB to HEP      Patient will benefit from skilled therapeutic intervention in order to improve the following deficits and impairments:  Visit Diagnosis: Muscle weakness (generalized)  Pain in right hip  Midline low back pain without sciatica  Pain in left hip     Problem List Patient Active Problem List   Diagnosis Date Noted  . Severe  recurrent major depression without psychotic features ([place])   . Suicidal ideation   . Chronic cholecystitis with calculus 09/27/2011  . Hx of colon cancer, stage II 08/24/2011  . Hyperlipidemia 08/24/2011  . Anxiety and depression 08/24/2011   Ruben Im, PT 10/30/15 11:28 PM Phone: [phone] Fax: [phone]  Alvera Singh 10/30/2015, 11:27 PM  [facility] [address] [place], [place], [address] Phone: [phone]   Fax:  [phone]  Name: Lorraine May MRN: [id number] Date of Birth: 02/23/1955

## 2015-11-03 ENCOUNTER — Ambulatory Visit: Payer: BLUE CROSS/BLUE SHIELD | Attending: Family Medicine

## 2015-11-03 DIAGNOSIS — G8929 Other chronic pain: Secondary | ICD-10-CM

## 2015-11-03 DIAGNOSIS — M25551 Pain in right hip: Secondary | ICD-10-CM | POA: Insufficient documentation

## 2015-11-03 DIAGNOSIS — M6281 Muscle weakness (generalized): Secondary | ICD-10-CM | POA: Diagnosis not present

## 2015-11-03 DIAGNOSIS — M25552 Pain in left hip: Secondary | ICD-10-CM | POA: Diagnosis not present

## 2015-11-03 DIAGNOSIS — M545 Low back pain: Secondary | ICD-10-CM | POA: Insufficient documentation

## 2015-11-03 NOTE — Therapy (Signed)
[facility] [address], [place], [place], [address] Phone: [phone]   Fax:  [phone]  Physical Therapy Treatment  Patient Details  Name: Lorraine May MRN: [id number] Date of Birth: 20-Sep-1955 Referring Provider: Dr. Darcus Austin  Encounter Date: 11/03/2015      PT End of Session - 11/03/15 1137    Visit Number 8   Date for PT Re-Evaluation 12/03/15   Authorization Type Patient states she is over her PT limits per insurance but is OK with self pay   PT Start Time 1100   PT Stop Time 1159   PT Time Calculation (min) 59 min   Activity Tolerance Patient tolerated treatment well   Behavior During Therapy [facility] for tasks assessed/performed      Past Medical History:  Diagnosis Date  . Anxiety   . Anxiety and depression 08/24/2011  . Arthritis   . Cancer ([place])    colon  . Depression   . Hx of colon cancer, stage II 08/24/2011   T3N0  adenoca cecum resected 04/2004  Xeloda adjuvant chemo  . Hyperlipidemia 08/24/2011  . Joint pain   . Nausea & vomiting   . PONV (postoperative nausea and vomiting)     Past Surgical History:  Procedure Laterality Date  . ABDOMINAL HYSTERECTOMY  2004  . BREAST SURGERY  2011   reduction  . CHOLECYSTECTOMY  10/05/2011   Procedure: LAPAROSCOPIC CHOLECYSTECTOMY WITH INTRAOPERATIVE CHOLANGIOGRAM;  Surgeon: Stark Klein, MD;  Location: [place];  Service: General;  Laterality: N/A;  . [place]  . HEMICOLECTOMY Right 2006  . HERNIA REPAIR  2007  . KNEE ARTHROSCOPY  2010  . NASAL SEPTUM SURGERY  1983  . OTHER SURGICAL HISTORY  2016   Transcranial magnetic stimulation  . PORT-A-CATH REMOVAL  2006   insertion and removal  . STRABISMUS SURGERY  1962    There were no vitals filed for this visit.      Subjective Assessment - 11/03/15 1104    Subjective Feeling better.  Pt has home TENs unit now.    Currently in Pain? Yes   Pain Score 4    Pain Location Hip    Pain Orientation Right;Left   Pain Descriptors / Indicators Aching   Pain Type Chronic pain   Pain Onset More than a month ago   Pain Frequency Intermittent   Aggravating Factors  standing long periods, activity   Pain Relieving Factors heat, manual therapy, dry needling                         [facility] Adult PT Treatment/Exercise - 11/03/15 0001      Lumbar Exercises: Stretches   Active Hamstring Stretch 3 reps;20 seconds   Single Knee to Chest Stretch 3 reps;20 seconds   Quad Stretch 3 reps;20 seconds  hip flexor edge of mat   Piriformis Stretch 3 reps;20 seconds  using green physioball      Lumbar Exercises: Aerobic   Stationary Bike Level 1 x 8 minutes  PT present for pain and goal assessment     Lumbar Exercises: Supine   Clam 20 reps;Other (comment)  with red band   Bridge --  too painful   Other Supine Lumbar Exercises ball squeeze 5" hold x 20      Lumbar Exercises: Sidelying   Clam 20 reps     Modalities   Modalities Iontophoresis     Moist Heat Therapy  Number Minutes Moist Heat 15 Minutes   Moist Heat Location Lumbar Spine     Electrical Stimulation   Electrical Stimulation Location Bil gluteals and low back   Electrical Stimulation Action IFC   Electrical Stimulation Parameters 15 minutes   Electrical Stimulation Goals Pain     Iontophoresis   Type of Iontophoresis Dexamethasone   Location bilateral hips   Dose 1.0 cc each  #2   Time 4-6 hour patch                  PT Short Term Goals - 10/30/15 2325      PT SHORT TERM GOAL #1   Title independent with initial HEP   Status Achieved     PT SHORT TERM GOAL #2   Title understand correct body mechanics and sleeping positions to reduce strain on back and hips   Status Achieved     PT SHORT TERM GOAL #3   Title pain with daily activities reduced >/= 25%   Status Achieved     PT SHORT TERM GOAL #4   Title pain with sitting and walking decreased >/= 25%   Status  Achieved           PT Long Term Goals - 11/03/15 1107      PT LONG TERM GOAL #1   Title independent with HEP    Time 8   Period Weeks   Status On-going     PT LONG TERM GOAL #3   Title sit for 2 hour to see a movie with pain decreased >/= 75%   Status Achieved     PT LONG TERM GOAL #4   Title drive for 30 min with pain in right leg decreased >/= 75%   Status Achieved     PT LONG TERM GOAL #6   Title be able to perform daily home tasks with >/= 75% greater ease due to reduction in pain   Status Achieved               Plan - 11/03/15 1108    Clinical Impression Statement Pt reports 75% overall improvement in symptoms since the start of care. Pt is independent and compliant with HEP and feels that the stretches are really helping.  Pt with trigger points in Rt gluteals and responds well to stretching.  Pt will continue to benefit from skilled PT for strength, flexiblity, endurance and manual/dry needling/modalities to reduce pain.     Rehab Potential Excellent   PT Frequency 2x / week   PT Duration 8 weeks   PT Treatment/Interventions ADLs/Self Care Home Management;Cryotherapy;Electrical Stimulation;Iontophoresis 4mg /ml Dexamethasone;Moist Heat;Therapeutic activities;Therapeutic exercise;Neuromuscular re-education;Patient/family education;Passive range of motion;Manual techniques;Dry needling   PT Next Visit Plan Continue ionto (#3);  dry needling if needed;  hip and core strength   Consulted and Agree with Plan of Care Patient      Patient will benefit from skilled therapeutic intervention in order to improve the following deficits and impairments:  Pain, Decreased strength, Decreased mobility, Decreased balance, Impaired flexibility, Improper body mechanics, Decreased activity tolerance, Decreased endurance, Decreased range of motion  Visit Diagnosis: Muscle weakness (generalized)  Pain in right hip  Chronic midline low back pain without sciatica  Pain in left  hip     Problem List Patient Active Problem List   Diagnosis Date Noted  . Severe recurrent major depression without psychotic features ([place])   . Suicidal ideation   . Chronic cholecystitis with calculus 09/27/2011  . Hx of  colon cancer, stage II 08/24/2011  . Hyperlipidemia 08/24/2011  . Anxiety and depression 08/24/2011     Sigurd Sos, PT 11/03/15 11:45 AM  [facility] [address] [place], [place], [address] Phone: [phone]   Fax:  [phone]  Name: Lorraine May MRN: [id number] Date of Birth: [birth year]

## 2015-11-04 DIAGNOSIS — F4310 Post-traumatic stress disorder, unspecified: Secondary | ICD-10-CM | POA: Diagnosis not present

## 2015-11-04 DIAGNOSIS — F606 Avoidant personality disorder: Secondary | ICD-10-CM | POA: Diagnosis not present

## 2015-11-04 DIAGNOSIS — F411 Generalized anxiety disorder: Secondary | ICD-10-CM | POA: Diagnosis not present

## 2015-11-04 DIAGNOSIS — F341 Dysthymic disorder: Secondary | ICD-10-CM | POA: Diagnosis not present

## 2015-11-04 DIAGNOSIS — F332 Major depressive disorder, recurrent severe without psychotic features: Secondary | ICD-10-CM | POA: Diagnosis not present

## 2015-11-06 ENCOUNTER — Ambulatory Visit: Payer: BLUE CROSS/BLUE SHIELD

## 2015-11-06 DIAGNOSIS — M25551 Pain in right hip: Secondary | ICD-10-CM

## 2015-11-06 DIAGNOSIS — M25552 Pain in left hip: Secondary | ICD-10-CM

## 2015-11-06 DIAGNOSIS — M545 Low back pain, unspecified: Secondary | ICD-10-CM

## 2015-11-06 DIAGNOSIS — G8929 Other chronic pain: Secondary | ICD-10-CM

## 2015-11-06 DIAGNOSIS — M6281 Muscle weakness (generalized): Secondary | ICD-10-CM | POA: Diagnosis not present

## 2015-11-06 NOTE — Therapy (Signed)
[facility] [address] [place], [place], [address] Phone: [phone]   Fax:  [phone]  Physical Therapy Treatment  Patient Details  Name: Lorraine May MRN: [id number] Date of Birth: Sep 11, 1955 Referring Provider: Dr. Darcus Austin  Encounter Date: 11/06/2015      PT End of Session - 11/06/15 1101    Visit Number 9   Date for PT Re-Evaluation 12/03/15   Authorization Type Patient states she is over her PT limits per insurance but is OK with self pay   PT Start Time 1018   PT Stop Time 1115   PT Time Calculation (min) 57 min   Activity Tolerance Patient tolerated treatment well   Behavior During Therapy [facility] for tasks assessed/performed      Past Medical History:  Diagnosis Date  . Anxiety   . Anxiety and depression 08/24/2011  . Arthritis   . Cancer ([place])    colon  . Depression   . Hx of colon cancer, stage II 08/24/2011   T3N0  adenoca cecum resected 04/2004  Xeloda adjuvant chemo  . Hyperlipidemia 08/24/2011  . Joint pain   . Nausea & vomiting   . PONV (postoperative nausea and vomiting)     Past Surgical History:  Procedure Laterality Date  . ABDOMINAL HYSTERECTOMY  2004  . BREAST SURGERY  2011   reduction  . CHOLECYSTECTOMY  10/05/2011   Procedure: LAPAROSCOPIC CHOLECYSTECTOMY WITH INTRAOPERATIVE CHOLANGIOGRAM;  Surgeon: Stark Klein, MD;  Location: [place];  Service: General;  Laterality: N/A;  . [place]  . HEMICOLECTOMY Right 2006  . HERNIA REPAIR  2007  . KNEE ARTHROSCOPY  2010  . NASAL SEPTUM SURGERY  1983  . OTHER SURGICAL HISTORY  2016   Transcranial magnetic stimulation  . PORT-A-CATH REMOVAL  2006   insertion and removal  . STRABISMUS SURGERY  1962    There were no vitals filed for this visit.      Subjective Assessment - 11/06/15 1021    Subjective Feeling a little bit more pain and stiffness today.  Getting a massage today so would like to hold on ionto  today.     Currently in Pain? Yes   Pain Score 5    Pain Location Hip   Pain Orientation Right;Left   Pain Descriptors / Indicators Aching   Pain Type Chronic pain   Pain Onset More than a month ago   Pain Frequency Intermittent   Aggravating Factors  sitting in the car, standing long periods   Pain Relieving Factors heat, manual therapy, dry needling.                         [place] Adult PT Treatment/Exercise - 11/06/15 0001      Lumbar Exercises: Stretches   Active Hamstring Stretch 3 reps;20 seconds   Single Knee to Chest Stretch 3 reps;20 seconds   Piriformis Stretch 3 reps;20 seconds     Lumbar Exercises: Aerobic   Stationary Bike Level 1 x 8 minutes  PT present for pain and goal assessment     Modalities   Modalities --     Moist Heat Therapy   Number Minutes Moist Heat 15 Minutes   Moist Heat Location Lumbar Spine     Electrical Stimulation   Electrical Stimulation Location Bil gluteals and low back   Electrical Stimulation Action IFC   Electrical Stimulation Parameters 15 minutes   Electrical Stimulation Goals Pain  Manual Therapy   Manual therapy comments soft tissue elongation to Rt gluteals with trigger point release           Trigger Point Dry Needling - 11/06/15 1027    Muscles Treated Lower Body Gluteus minimus;Piriformis  Rt gluteals   Gluteus Minimus Response Twitch response elicited;Palpable increased muscle length   Piriformis Response Twitch response elicited;Palpable increased muscle length                PT Short Term Goals - 10/30/15 2325      PT SHORT TERM GOAL #1   Title independent with initial HEP   Status Achieved     PT SHORT TERM GOAL #2   Title understand correct body mechanics and sleeping positions to reduce strain on back and hips   Status Achieved     PT SHORT TERM GOAL #3   Title pain with daily activities reduced >/= 25%   Status Achieved     PT SHORT TERM GOAL #4   Title pain with  sitting and walking decreased >/= 25%   Status Achieved           PT Long Term Goals - 11/03/15 1107      PT LONG TERM GOAL #1   Title independent with HEP    Time 8   Period Weeks   Status On-going     PT LONG TERM GOAL #3   Title sit for 2 hour to see a movie with pain decreased >/= 75%   Status Achieved     PT LONG TERM GOAL #4   Title drive for 30 min with pain in right leg decreased >/= 75%   Status Achieved     PT LONG TERM GOAL #6   Title be able to perform daily home tasks with >/= 75% greater ease due to reduction in pain   Status Achieved               Plan - 11/06/15 1022    Clinical Impression Statement Pt with 75% overall improvement in symptoms since the start of care.  Pt is independent and compliant with HEP and is stretching at home.  Pt with trigger points in Rt gluteals and responds well to streching and demonstrated improved mobility after dry needling.  Pt will continue to benefit from skilled PT for strength, flexibility, endurance and manual/dry needling to reduce pain.     Rehab Potential Excellent   PT Frequency 2x / week   PT Duration 8 weeks   PT Treatment/Interventions ADLs/Self Care Home Management;Cryotherapy;Electrical Stimulation;Iontophoresis 4mg /ml Dexamethasone;Moist Heat;Therapeutic activities;Therapeutic exercise;Neuromuscular re-education;Patient/family education;Passive range of motion;Manual techniques;Dry needling   PT Next Visit Plan Continue ionto (#3);  dry needling if needed;  hip and core strength   Consulted and Agree with Plan of Care Patient      Patient will benefit from skilled therapeutic intervention in order to improve the following deficits and impairments:  Pain, Decreased strength, Decreased mobility, Decreased balance, Impaired flexibility, Improper body mechanics, Decreased activity tolerance, Decreased endurance, Decreased range of motion  Visit Diagnosis: Muscle weakness (generalized)  Pain in right  hip  Chronic midline low back pain without sciatica  Pain in left hip     Problem List Patient Active Problem List   Diagnosis Date Noted  . Severe recurrent major depression without psychotic features ([place])   . Suicidal ideation   . Chronic cholecystitis with calculus 09/27/2011  . Hx of colon cancer, stage II 08/24/2011  . Hyperlipidemia  08/24/2011  . Anxiety and depression 08/24/2011     Sigurd Sos, PT 11/06/15 11:03 AM  [facility] [address] [place], [place], [address] Phone: [phone]   Fax:  [phone]  Name: Lorraine May MRN: [id number] Date of Birth: Jan 22, 1956

## 2015-11-10 ENCOUNTER — Encounter: Payer: Self-pay | Admitting: Physical Therapy

## 2015-11-11 ENCOUNTER — Encounter: Payer: Self-pay | Admitting: Physical Therapy

## 2015-11-11 ENCOUNTER — Ambulatory Visit: Payer: BLUE CROSS/BLUE SHIELD | Admitting: Physical Therapy

## 2015-11-11 DIAGNOSIS — M6281 Muscle weakness (generalized): Secondary | ICD-10-CM

## 2015-11-11 DIAGNOSIS — G8929 Other chronic pain: Secondary | ICD-10-CM

## 2015-11-11 DIAGNOSIS — M545 Low back pain, unspecified: Secondary | ICD-10-CM

## 2015-11-11 DIAGNOSIS — M25552 Pain in left hip: Secondary | ICD-10-CM | POA: Diagnosis not present

## 2015-11-11 DIAGNOSIS — F411 Generalized anxiety disorder: Secondary | ICD-10-CM | POA: Diagnosis not present

## 2015-11-11 DIAGNOSIS — M25551 Pain in right hip: Secondary | ICD-10-CM

## 2015-11-11 DIAGNOSIS — F4310 Post-traumatic stress disorder, unspecified: Secondary | ICD-10-CM | POA: Diagnosis not present

## 2015-11-11 DIAGNOSIS — F606 Avoidant personality disorder: Secondary | ICD-10-CM | POA: Diagnosis not present

## 2015-11-11 DIAGNOSIS — F341 Dysthymic disorder: Secondary | ICD-10-CM | POA: Diagnosis not present

## 2015-11-11 NOTE — Therapy (Signed)
[facility] [address], [place], [place], [address] Phone: [phone]   Fax:  [phone]  Physical Therapy Treatment  Patient Details  Name: Lorraine May MRN: [id number] Date of Birth: 1955-02-27 Referring Provider: Dr. Darcus Austin  Encounter Date: 11/11/2015      PT End of Session - 11/11/15 1150    Visit Number 10   Date for PT Re-Evaluation 12/03/15   Authorization Type Patient states she is over her PT limits per insurance but is OK with self pay   PT Start Time 1144   PT Stop Time 1246   PT Time Calculation (min) 62 min   Activity Tolerance Patient tolerated treatment well   Behavior During Therapy [facility] for tasks assessed/performed      Past Medical History:  Diagnosis Date  . Anxiety   . Anxiety and depression 08/24/2011  . Arthritis   . Cancer ([place])    colon  . Depression   . Hx of colon cancer, stage II 08/24/2011   T3N0  adenoca cecum resected 04/2004  Xeloda adjuvant chemo  . Hyperlipidemia 08/24/2011  . Joint pain   . Nausea & vomiting   . PONV (postoperative nausea and vomiting)     Past Surgical History:  Procedure Laterality Date  . ABDOMINAL HYSTERECTOMY  2004  . BREAST SURGERY  2011   reduction  . CHOLECYSTECTOMY  10/05/2011   Procedure: LAPAROSCOPIC CHOLECYSTECTOMY WITH INTRAOPERATIVE CHOLANGIOGRAM;  Surgeon: Stark Klein, MD;  Location: [place];  Service: General;  Laterality: N/A;  . [place]  . HEMICOLECTOMY Right 2006  . HERNIA REPAIR  2007  . KNEE ARTHROSCOPY  2010  . NASAL SEPTUM SURGERY  1983  . OTHER SURGICAL HISTORY  2016   Transcranial magnetic stimulation  . PORT-A-CATH REMOVAL  2006   insertion and removal  . STRABISMUS SURGERY  1962    There were no vitals filed for this visit.      Subjective Assessment - 11/11/15 1147    Subjective Back feeling good today. Was able to tolerate being on feet for 3 hours yesterday without increased pain.      Pertinent History mild to moderate kidney disease   Limitations Sitting;Standing   Patient Stated Goals exercises at home to manage pain and resolve   Currently in Pain? Yes   Pain Score 4    Pain Location Hip   Pain Orientation Right;Left   Pain Descriptors / Indicators Aching   Pain Type Chronic pain   Multiple Pain Sites No                         [facility] Adult PT Treatment/Exercise - 11/11/15 0001      Lumbar Exercises: Stretches   Active Hamstring Stretch 3 reps;20 seconds   Quad Stretch 3 reps;20 seconds  hip flexor edge of mat   Piriformis Stretch 3 reps;20 seconds     Lumbar Exercises: Aerobic   Stationary Bike Level 1 x 8 minutes  PT present for pain and goal assessment     Lumbar Exercises: Supine   Ab Set 20 reps  with red ball   Clam 20 reps;Other (comment)  with red band   Other Supine Lumbar Exercises ball squeeze 5" hold x 20      Knee/Hip Exercises: Standing   Hip Abduction Stengthening;Both;2 sets;5 reps   Hip Extension Stengthening;Both;2 sets;5 reps     Modalities   Modalities Iontophoresis  Moist Heat Therapy   Number Minutes Moist Heat 15 Minutes   Moist Heat Location Lumbar Spine     Electrical Stimulation   Electrical Stimulation Location Bil gluteals and low back   Electrical Stimulation Action IFC   Electrical Stimulation Parameters 15 minutes to tolerance   Electrical Stimulation Goals Pain     Iontophoresis   Type of Iontophoresis Dexamethasone   Location bilateral hips   Dose 1.0 cc each  #3   Time 4-6 hour patch                PT Education - 11/11/15 1237    Education provided Yes   Education Details ionto   Person(s) Educated Patient   Methods Explanation   Comprehension Verbalized understanding          PT Short Term Goals - 11/11/15 1151      PT SHORT TERM GOAL #1   Title independent with initial HEP   Time 4   Period Weeks   Status Achieved     PT SHORT TERM GOAL #2   Title  understand correct body mechanics and sleeping positions to reduce strain on back and hips   Time 4   Period Weeks   Status Achieved     PT SHORT TERM GOAL #3   Title pain with daily activities reduced >/= 25%   Time 4   Period Weeks   Status Achieved     PT SHORT TERM GOAL #4   Title pain with sitting and walking decreased >/= 25%   Time 4   Period Weeks   Status Achieved           PT Long Term Goals - 11/11/15 1152      PT LONG TERM GOAL #1   Title independent with HEP    Time 8   Period Weeks   Status On-going     PT LONG TERM GOAL #2   Title bil. hip strength >/= 4+/5 so she is able to stand or walk for 1 hour in a store   Time 8   Period Weeks   Status On-going     PT LONG TERM GOAL #3   Title sit for 2 hour to see a movie with pain decreased >/= 75%   Time 8   Period Weeks   Status Achieved     PT LONG TERM GOAL #4   Title drive for 30 min with pain in right leg decreased >/= 75%   Time 8   Period Weeks   Status Achieved     PT LONG TERM GOAL #5   Title FOTO score </= 43% limitation   Time 8   Period Weeks   Status On-going     PT LONG TERM GOAL #6   Title be able to perform daily home tasks with >/= 75% greater ease due to reduction in pain   Time 8   Period Weeks   Status Achieved     PT LONG TERM GOAL #7   Title shave legs with >/= 75% greater ease due to increase mobility and reduction in pain   Time 8   Period Weeks   Status On-going               Plan - 11/11/15 1151    Clinical Impression Statement Pt responding well to theraputic interventions. Progressed to standing core and hip exercises which patient was able to complete with minimal discomfort in Lt hip. Pt will continue  to benfit from skilled therapy for Bil hip and core strenghtening and stbilization.    Rehab Potential Excellent   Clinical Impairments Affecting Rehab Potential [place]   PT Frequency 2x / week   PT Duration 8 weeks   PT Treatment/Interventions ADLs/Self  Care Home Management;Cryotherapy;Electrical Stimulation;Iontophoresis 4mg /ml Dexamethasone;Moist Heat;Therapeutic activities;Therapeutic exercise;Neuromuscular re-education;Patient/family education;Passive range of motion;Manual techniques;Dry needling   PT Next Visit Plan Continue ionto (#4);  dry needling if needed;  hip and core strength   Consulted and Agree with Plan of Care Patient      Patient will benefit from skilled therapeutic intervention in order to improve the following deficits and impairments:  Pain, Decreased strength, Decreased mobility, Decreased balance, Impaired flexibility, Improper body mechanics, Decreased activity tolerance, Decreased endurance, Decreased range of motion  Visit Diagnosis: Muscle weakness (generalized)  Pain in right hip  Chronic midline low back pain without sciatica  Pain in left hip  Acute midline low back pain without sciatica     Problem List Patient Active Problem List   Diagnosis Date Noted  . Severe recurrent major depression without psychotic features ([place])   . Suicidal ideation   . Chronic cholecystitis with calculus 09/27/2011  . Hx of colon cancer, stage II 08/24/2011  . Hyperlipidemia 08/24/2011  . Anxiety and depression 08/24/2011    Mikle Bosworth PTA 11/11/2015, 1:15 PM  [facility] [address], [place], [place], [address] Phone: [phone]   Fax:  [phone]  Name: Lorraine May MRN: [id number] Date of Birth: 10-Apr-1955

## 2015-11-11 NOTE — Patient Instructions (Signed)

## 2015-11-13 ENCOUNTER — Ambulatory Visit: Payer: BLUE CROSS/BLUE SHIELD

## 2015-11-13 DIAGNOSIS — M25552 Pain in left hip: Secondary | ICD-10-CM | POA: Diagnosis not present

## 2015-11-13 DIAGNOSIS — M6281 Muscle weakness (generalized): Secondary | ICD-10-CM | POA: Diagnosis not present

## 2015-11-13 DIAGNOSIS — M545 Low back pain, unspecified: Secondary | ICD-10-CM

## 2015-11-13 DIAGNOSIS — M25551 Pain in right hip: Secondary | ICD-10-CM | POA: Diagnosis not present

## 2015-11-13 DIAGNOSIS — G8929 Other chronic pain: Secondary | ICD-10-CM | POA: Diagnosis not present

## 2015-11-13 NOTE — Therapy (Signed)
[facility] [address], [place], [place], [address] Phone: [phone]   Fax:  [phone]  Physical Therapy Treatment  Patient Details  Name: Lorraine May MRN: [id number] Date of Birth: 10-21-1955 Referring Provider: Dr. Darcus Austin  Encounter Date: 11/13/2015      PT End of Session - 11/13/15 1144    Visit Number 11   Date for PT Re-Evaluation 12/03/15   Authorization Type Patient states she is over her PT limits per insurance but is OK with self pay   PT Start Time 1100   PT Stop Time 1159   PT Time Calculation (min) 59 min   Activity Tolerance Patient tolerated treatment well   Behavior During Therapy [facility] for tasks assessed/performed      Past Medical History:  Diagnosis Date  . Anxiety   . Anxiety and depression 08/24/2011  . Arthritis   . Cancer ([place])    colon  . Depression   . Hx of colon cancer, stage II 08/24/2011   T3N0  adenoca cecum resected 04/2004  Xeloda adjuvant chemo  . Hyperlipidemia 08/24/2011  . Joint pain   . Nausea & vomiting   . PONV (postoperative nausea and vomiting)     Past Surgical History:  Procedure Laterality Date  . ABDOMINAL HYSTERECTOMY  2004  . BREAST SURGERY  2011   reduction  . CHOLECYSTECTOMY  10/05/2011   Procedure: LAPAROSCOPIC CHOLECYSTECTOMY WITH INTRAOPERATIVE CHOLANGIOGRAM;  Surgeon: Stark Klein, MD;  Location: [place];  Service: General;  Laterality: N/A;  . [place]  . HEMICOLECTOMY Right 2006  . HERNIA REPAIR  2007  . KNEE ARTHROSCOPY  2010  . NASAL SEPTUM SURGERY  1983  . OTHER SURGICAL HISTORY  2016   Transcranial magnetic stimulation  . PORT-A-CATH REMOVAL  2006   insertion and removal  . STRABISMUS SURGERY  1962    There were no vitals filed for this visit.      Subjective Assessment - 11/13/15 1111    Subjective I didn't sleep well last night, tossed and turned all night   Currently in Pain? Yes   Pain Score 5    Pain Location Hip   Pain Orientation Right;Left   Pain Descriptors / Indicators Aching   Pain Type Chronic pain   Pain Onset More than a month ago   Pain Frequency Intermittent   Aggravating Factors  sitting in the car, standing long periods   Pain Relieving Factors heat, manual therapy , dry needling                         [facility] Adult PT Treatment/Exercise - 11/13/15 0001      Lumbar Exercises: Stretches   Active Hamstring Stretch 3 reps;20 seconds   Quad Stretch 3 reps;20 seconds  hip flexor edge of mat   Piriformis Stretch 3 reps;20 seconds     Lumbar Exercises: Aerobic   Stationary Bike Level 1 x 8 minutes  PT present for pain and goal assessment     Lumbar Exercises: Supine   Ab Set 20 reps  with red ball   Clam 20 reps;Other (comment)  with red band   Other Supine Lumbar Exercises ball squeeze 5" hold x 20      Knee/Hip Exercises: Standing   Hip Abduction Stengthening;Both;2 sets;5 reps   Hip Extension Stengthening;Both;2 sets;5 reps     Moist Heat Therapy   Number Minutes Moist Heat 15 Minutes  Moist Heat Location Lumbar Spine     Electrical Stimulation   Electrical Stimulation Location Bil gluteals and low back   Electrical Stimulation Action IFC   Electrical Stimulation Parameters 15 minutes    Electrical Stimulation Goals Pain          Trigger Point Dry Needling - 11/13/15 1112    Consent Given? Yes   Muscles Treated Lower Body Gluteus minimus;Piriformis  lumbar multifidi   Gluteus Minimus Response Twitch response elicited;Palpable increased muscle length   Piriformis Response Twitch response elicited;Palpable increased muscle length                PT Short Term Goals - 11/11/15 1151      PT SHORT TERM GOAL #1   Title independent with initial HEP   Time 4   Period Weeks   Status Achieved     PT SHORT TERM GOAL #2   Title understand correct body mechanics and sleeping positions to reduce strain on back and hips    Time 4   Period Weeks   Status Achieved     PT SHORT TERM GOAL #3   Title pain with daily activities reduced >/= 25%   Time 4   Period Weeks   Status Achieved     PT SHORT TERM GOAL #4   Title pain with sitting and walking decreased >/= 25%   Time 4   Period Weeks   Status Achieved           PT Long Term Goals - 11/11/15 1152      PT LONG TERM GOAL #1   Title independent with HEP    Time 8   Period Weeks   Status On-going     PT LONG TERM GOAL #2   Title bil. hip strength >/= 4+/5 so she is able to stand or walk for 1 hour in a store   Time 8   Period Weeks   Status On-going     PT LONG TERM GOAL #3   Title sit for 2 hour to see a movie with pain decreased >/= 75%   Time 8   Period Weeks   Status Achieved     PT LONG TERM GOAL #4   Title drive for 30 min with pain in right leg decreased >/= 75%   Time 8   Period Weeks   Status Achieved     PT LONG TERM GOAL #5   Title FOTO score </= 43% limitation   Time 8   Period Weeks   Status On-going     PT LONG TERM GOAL #6   Title be able to perform daily home tasks with >/= 75% greater ease due to reduction in pain   Time 8   Period Weeks   Status Achieved     PT LONG TERM GOAL #7   Title shave legs with >/= 75% greater ease due to increase mobility and reduction in pain   Time 8   Period Weeks   Status On-going               Plan - 11/13/15 1113    Clinical Impression Statement Pt with continued trigger points in the gluteals and lumbar spine.  Pt with improved tissue mobility after dry needling today.  Pt will continue to benefit from skilled PT for strength, flexibility and manual therapy for bil hip an core strength/stabilization.   Rehab Potential Excellent   PT Frequency 2x / week   PT  Duration 8 weeks   PT Treatment/Interventions ADLs/Self Care Home Management;Cryotherapy;Electrical Stimulation;Iontophoresis 4mg /ml Dexamethasone;Moist Heat;Therapeutic activities;Therapeutic  exercise;Neuromuscular re-education;Patient/family education;Passive range of motion;Manual techniques;Dry needling   PT Next Visit Plan Continue ionto (#4);  dry needling if needed (1 more session as needed);  hip and core strength   Consulted and Agree with Plan of Care Patient      Patient will benefit from skilled therapeutic intervention in order to improve the following deficits and impairments:  Pain, Decreased strength, Decreased mobility, Decreased balance, Impaired flexibility, Improper body mechanics, Decreased activity tolerance, Decreased endurance, Decreased range of motion  Visit Diagnosis: Muscle weakness (generalized)  Pain in right hip  Pain in left hip  Acute midline low back pain without sciatica     Problem List Patient Active Problem List   Diagnosis Date Noted  . Severe recurrent major depression without psychotic features ([place])   . Suicidal ideation   . Chronic cholecystitis with calculus 09/27/2011  . Hx of colon cancer, stage II 08/24/2011  . Hyperlipidemia 08/24/2011  . Anxiety and depression 08/24/2011     Sigurd Sos, PT 11/13/15 11:45 AM   [facility] [address] [place], [place], [address] Phone: [phone]   Fax:  [phone]  Name: Lorraine May MRN: [id number] Date of Birth: 1955/04/17

## 2015-11-17 ENCOUNTER — Ambulatory Visit: Payer: BLUE CROSS/BLUE SHIELD | Admitting: Physical Therapy

## 2015-11-17 ENCOUNTER — Encounter: Payer: Self-pay | Admitting: Physical Therapy

## 2015-11-17 DIAGNOSIS — M545 Low back pain, unspecified: Secondary | ICD-10-CM

## 2015-11-17 DIAGNOSIS — M25552 Pain in left hip: Secondary | ICD-10-CM | POA: Diagnosis not present

## 2015-11-17 DIAGNOSIS — M25551 Pain in right hip: Secondary | ICD-10-CM | POA: Diagnosis not present

## 2015-11-17 DIAGNOSIS — M6281 Muscle weakness (generalized): Secondary | ICD-10-CM

## 2015-11-17 DIAGNOSIS — G8929 Other chronic pain: Secondary | ICD-10-CM | POA: Diagnosis not present

## 2015-11-17 NOTE — Patient Instructions (Addendum)
IONTOPHORESIS PATIENT PRECAUTIONS & CONTRAINDICATIONS:  . Redness under one or both electrodes can occur.  This characterized by a uniform redness that usually disappears within 12 hours of treatment. . Small pinhead size blisters may result in response to the drug.  Contact your physician if the problem persists more than 24 hours. . On rare occasions, iontophoresis therapy can result in temporary skin reactions such as rash, inflammation, irritation or burns.  The skin reactions may be the result of individual sensitivity to the ionic solution used, the condition of the skin at the start of treatment, reaction to the materials in the electrodes, allergies or sensitivity to dexamethasone, or a poor connection between the patch and your skin.  Discontinue using iontophoresis if you have any of these reactions and report to your therapist. . Remove the Patch or electrodes if you have any undue sensation of pain or burning during the treatment and report discomfort to your therapist. . Tell your Therapist if you have had known adverse reactions to the application of electrical current. . If using the Patch, the LED light will turn off when treatment is complete and the patch can be removed.  Approximate treatment time is 1-3 hours.  Remove the patch when light goes off or after 6 hours. . The Patch can be worn during normal activity, however excessive motion where the electrodes have been placed can cause poor contact between the skin and the electrode or uneven electrical current resulting in greater risk of skin irritation. Marland Kitchen Keep out of the reach of children.   . DO NOT use if you have a cardiac pacemaker or any other electrically sensitive implanted device. . DO NOT use if you have a known sensitivity to dexamethasone. . DO NOT use during Magnetic Resonance Imaging (MRI). . DO NOT use over broken or compromised skin (e.g. sunburn, cuts, or acne) due to the increased risk of skin reaction. . DO  NOT SHAVE over the area to be treated:  To establish good contact between the Patch and the skin, excessive hair may be clipped. . DO NOT place the Patch or electrodes on or over your eyes, directly over your heart, or brain. . DO NOT reuse the Patch or electrodes as this may cause burns to occur.  Adduction: Hip - Knees Together (Hook-Lying)    Lie with hips and knees bent, towel roll between knees. Push knees together. Hold for ___ seconds. Rest for ___ seconds. Repeat ___ times. Do ___ times a day.   Copyright  VHI. All rights reserved.   ABDUCTION: Standing (Active)    Stand, feet flat. Lift right leg out to side. Use ___ lbs. Complete ___ sets of ___ repetitions. Perform ___ sessions per day.  http://gtsc.exer.us/111   Copyright  VHI. All rights reserved.  Jeanie Sewer PTA [facility] [address] [place], [place] [address] Phone # [phone] Fax [phone]

## 2015-11-17 NOTE — Therapy (Signed)
[facility] [address] [place], [place], [address] Phone: [phone]   Fax:  [phone]  Physical Therapy Treatment  Patient Details  Name: Lorraine May MRN: [id number] Date of Birth: 1955/03/04 Referring Provider: Dr. Darcus Austin  Encounter Date: 11/17/2015      PT End of Session - 11/17/15 1415    Visit Number 12   Date for PT Re-Evaluation 12/03/15   Authorization Type Patient states she is over her PT limits per insurance but is OK with self pay   PT Start Time 1358   PT Stop Time 1458   PT Time Calculation (min) 60 min   Activity Tolerance Patient tolerated treatment well   Behavior During Therapy [facility] for tasks assessed/performed      Past Medical History:  Diagnosis Date  . Anxiety   . Anxiety and depression 08/24/2011  . Arthritis   . Cancer ([place])    colon  . Depression   . Hx of colon cancer, stage II 08/24/2011   T3N0  adenoca cecum resected 04/2004  Xeloda adjuvant chemo  . Hyperlipidemia 08/24/2011  . Joint pain   . Nausea & vomiting   . PONV (postoperative nausea and vomiting)     Past Surgical History:  Procedure Laterality Date  . ABDOMINAL HYSTERECTOMY  2004  . BREAST SURGERY  2011   reduction  . CHOLECYSTECTOMY  10/05/2011   Procedure: LAPAROSCOPIC CHOLECYSTECTOMY WITH INTRAOPERATIVE CHOLANGIOGRAM;  Surgeon: Stark Klein, MD;  Location: [place];  Service: General;  Laterality: N/A;  . [place]  . HEMICOLECTOMY Right 2006  . HERNIA REPAIR  2007  . KNEE ARTHROSCOPY  2010  . NASAL SEPTUM SURGERY  1983  . OTHER SURGICAL HISTORY  2016   Transcranial magnetic stimulation  . PORT-A-CATH REMOVAL  2006   insertion and removal  . STRABISMUS SURGERY  1962    There were no vitals filed for this visit.      Subjective Assessment - 11/17/15 1357    Subjective Feeling pretty good today. Much better than last time.    Pertinent History mild to moderate kidney disease    Limitations Sitting;Standing   Patient Stated Goals exercises at home to manage pain and resolve   Currently in Pain? Yes   Pain Score 3    Pain Location Hip   Pain Orientation Right;Left   Pain Descriptors / Indicators Aching   Pain Type Chronic pain   Multiple Pain Sites No                         [facility] Adult PT Treatment/Exercise - 11/17/15 0001      Lumbar Exercises: Stretches   Active Hamstring Stretch 3 reps;20 seconds   Quad Stretch 3 reps;20 seconds  hip flexor edge of mat   Piriformis Stretch 3 reps;20 seconds     Lumbar Exercises: Aerobic   Stationary Bike Level 1 x 8 minutes  PT present for pain and goal assessment     Lumbar Exercises: Supine   Ab Set 20 reps  with red ball   Clam 20 reps;Other (comment);2 seconds  with red band   Bridge 20 reps   Straight Leg Raise 20 reps   Other Supine Lumbar Exercises ball squeeze 5" hold x 20      Knee/Hip Exercises: Standing   Hip Flexion Both;2 sets;10 reps   Hip Abduction Stengthening;Both;2 sets;5 reps   Hip Extension Stengthening;Both;2 sets;5 reps  Knee/Hip Exercises: Seated   Sit to Sand 2 sets;10 reps  verbal cues for technique     Modalities   Modalities Iontophoresis     Moist Heat Therapy   Number Minutes Moist Heat 15 Minutes   Moist Heat Location Lumbar Spine     Electrical Stimulation   Electrical Stimulation Location Bil gluteals   Electrical Stimulation Action IFC   Electrical Stimulation Parameters to tolerance 15 minutes   Electrical Stimulation Goals Pain     Iontophoresis   Type of Iontophoresis Dexamethasone   Location bilateral hips   Dose 1.0 cc each  #4   Time 4-6 hour patch     Manual Therapy   Manual Therapy Soft tissue mobilization   Manual therapy comments IASTM    Passive ROM Bil IT band, glutes, and hamstrings                PT Education - 11/17/15 1423    Education provided Yes   Education Details Bil LE strengthening exercises; ionto    Person(s) Educated Patient   Methods Explanation;Handout   Comprehension Verbalized understanding          PT Short Term Goals - 11/17/15 1723      PT SHORT TERM GOAL #1   Title independent with initial HEP   Time 4   Period Weeks   Status Achieved     PT SHORT TERM GOAL #2   Title understand correct body mechanics and sleeping positions to reduce strain on back and hips   Time 4   Period Weeks   Status Achieved     PT SHORT TERM GOAL #3   Title pain with daily activities reduced >/= 25%   Time 4   Period Weeks   Status Achieved     PT SHORT TERM GOAL #4   Title pain with sitting and walking decreased >/= 25%   Time 4   Period Weeks   Status Achieved           PT Long Term Goals - 11/17/15 1723      PT LONG TERM GOAL #1   Title independent with HEP    Time 8   Period Weeks   Status On-going     PT LONG TERM GOAL #2   Title bil. hip strength >/= 4+/5 so she is able to stand or walk for 1 hour in a store   Time 8   Period Weeks   Status On-going     PT LONG TERM GOAL #3   Title sit for 2 hour to see a movie with pain decreased >/= 75%   Time 8   Period Weeks   Status Achieved     PT LONG TERM GOAL #4   Title drive for 30 min with pain in right leg decreased >/= 75%   Time 8   Period Weeks   Status Achieved     PT LONG TERM GOAL #5   Title FOTO score </= 43% limitation   Time 8   Period Weeks   Status On-going     PT LONG TERM GOAL #6   Title be able to perform daily home tasks with >/= 75% greater ease due to reduction in pain   Time 8   Period Weeks   Status Achieved     PT LONG TERM GOAL #7   Title shave legs with >/= 75% greater ease due to increase mobility and reduction in pain   Time 8  Period Weeks   Status On-going               Plan - 11/17/15 1717    Clinical Impression Statement Pt able to complete all strengthening exercises well with no increase in pain. Some tenderness in Bil glutes and hamstrings that  decreased with manual techniques. Pt would continue to benefit from skilled therrapy for strengthening in core and Bil LE to return to prior level of funciton.    Rehab Potential Excellent   Clinical Impairments Affecting Rehab Potential NOne   PT Frequency 2x / week   PT Duration 8 weeks   PT Treatment/Interventions ADLs/Self Care Home Management;Cryotherapy;Electrical Stimulation;Iontophoresis 4mg /ml Dexamethasone;Moist Heat;Therapeutic activities;Therapeutic exercise;Neuromuscular re-education;Patient/family education;Passive range of motion;Manual techniques;Dry needling   PT Next Visit Plan Continue ionto #5, hip and core strength   PT Home Exercise Plan hip strengthening   Consulted and Agree with Plan of Care Patient      Patient will benefit from skilled therapeutic intervention in order to improve the following deficits and impairments:  Pain, Decreased strength, Decreased mobility, Decreased balance, Impaired flexibility, Improper body mechanics, Decreased activity tolerance, Decreased endurance, Decreased range of motion  Visit Diagnosis: Muscle weakness (generalized)  Pain in right hip  Pain in left hip  Acute midline low back pain without sciatica     Problem List Patient Active Problem List   Diagnosis Date Noted  . Severe recurrent major depression without psychotic features ([place])   . Suicidal ideation   . Chronic cholecystitis with calculus 09/27/2011  . Hx of colon cancer, stage II 08/24/2011  . Hyperlipidemia 08/24/2011  . Anxiety and depression 08/24/2011    Mikle Bosworth PTA 11/17/2015, 5:25 PM  [facility] [address], [place], [place], [address] Phone: [phone]   Fax:  [phone]  Name: Lorraine May MRN: [id number] Date of Birth: Jun 20, 1955

## 2015-11-18 DIAGNOSIS — F341 Dysthymic disorder: Secondary | ICD-10-CM | POA: Diagnosis not present

## 2015-11-18 DIAGNOSIS — F606 Avoidant personality disorder: Secondary | ICD-10-CM | POA: Diagnosis not present

## 2015-11-18 DIAGNOSIS — F411 Generalized anxiety disorder: Secondary | ICD-10-CM | POA: Diagnosis not present

## 2015-11-18 DIAGNOSIS — F4310 Post-traumatic stress disorder, unspecified: Secondary | ICD-10-CM | POA: Diagnosis not present

## 2015-11-20 ENCOUNTER — Encounter: Payer: Self-pay | Admitting: Physical Therapy

## 2015-11-20 ENCOUNTER — Ambulatory Visit: Payer: BLUE CROSS/BLUE SHIELD | Admitting: Physical Therapy

## 2015-11-20 DIAGNOSIS — M6281 Muscle weakness (generalized): Secondary | ICD-10-CM

## 2015-11-20 DIAGNOSIS — M25552 Pain in left hip: Secondary | ICD-10-CM | POA: Diagnosis not present

## 2015-11-20 DIAGNOSIS — M25551 Pain in right hip: Secondary | ICD-10-CM | POA: Diagnosis not present

## 2015-11-20 DIAGNOSIS — G8929 Other chronic pain: Secondary | ICD-10-CM

## 2015-11-20 DIAGNOSIS — M545 Low back pain, unspecified: Secondary | ICD-10-CM

## 2015-11-20 NOTE — Therapy (Signed)
[facility] [address], [place], [place], [address] Phone: [phone]   Fax:  [phone]  Physical Therapy Treatment  Patient Details  Name: Lorraine May MRN: [id number] Date of Birth: [birth year] Referring Provider: Dr. Darcus Austin  Encounter Date: 11/20/2015      PT End of Session - 11/20/15 1444    Visit Number 13   Date for PT Re-Evaluation 12/03/15   Authorization Type Patient states she is over her PT limits per insurance but is OK with self pay   PT Start Time 1442   PT Stop Time 1546   PT Time Calculation (min) 64 min   Activity Tolerance Patient tolerated treatment well   Behavior During Therapy [facility] for tasks assessed/performed      Past Medical History:  Diagnosis Date  . Anxiety   . Anxiety and depression 08/24/2011  . Arthritis   . Cancer ([place])    colon  . Depression   . Hx of colon cancer, stage II 08/24/2011   T3N0  adenoca cecum resected 04/2004  Xeloda adjuvant chemo  . Hyperlipidemia 08/24/2011  . Joint pain   . Nausea & vomiting   . PONV (postoperative nausea and vomiting)     Past Surgical History:  Procedure Laterality Date  . ABDOMINAL HYSTERECTOMY  2004  . BREAST SURGERY  2011   reduction  . CHOLECYSTECTOMY  10/05/2011   Procedure: LAPAROSCOPIC CHOLECYSTECTOMY WITH INTRAOPERATIVE CHOLANGIOGRAM;  Surgeon: Stark Klein, MD;  Location: [place];  Service: General;  Laterality: N/A;  . [place]  . HEMICOLECTOMY Right 2006  . HERNIA REPAIR  2007  . KNEE ARTHROSCOPY  2010  . NASAL SEPTUM SURGERY  1983  . OTHER SURGICAL HISTORY  2016   Transcranial magnetic stimulation  . PORT-A-CATH REMOVAL  2006   insertion and removal  . STRABISMUS SURGERY  1962    There were no vitals filed for this visit.      Subjective Assessment - 11/20/15 1442    Subjective Pt having some increased pain today. Has been very active and getting in and out of car a lot today.     Pertinent History mild to moderate kidney disease   Limitations Sitting;Standing   Patient Stated Goals exercises at home to manage pain and resolve   Currently in Pain? Yes   Pain Score 4    Pain Location Hip   Pain Orientation Right;Left   Pain Descriptors / Indicators Aching   Pain Type Chronic pain   Pain Onset More than a month ago   Multiple Pain Sites No                         [facility] Adult PT Treatment/Exercise - 11/20/15 0001      Lumbar Exercises: Stretches   Active Hamstring Stretch 3 reps;20 seconds   Quad Stretch 3 reps;20 seconds  hip flexor edge of mat   Piriformis Stretch 3 reps;20 seconds     Lumbar Exercises: Aerobic   Stationary Bike Level 1 x 8 minutes  PT present for pain and goal assessment     Lumbar Exercises: Standing   Row Strengthening;Both;20 reps  #15   Theraband Level (Row) --   Row Limitations --   Shoulder Extension Strengthening;20 reps  #15   Theraband Level (Shoulder Extension) --   Shoulder Extension Limitations --     Lumbar Exercises: Supine   Ab Set 20 reps  with  red ball   Clam --  with red band   Bridge 20 reps     Lumbar Exercises: Prone   Other Prone Lumbar Exercises Childs pose     Knee/Hip Exercises: Standing   Hip Flexion --   Hip Abduction Stengthening;Both;2 sets;5 reps  Did not finish sets due to increased hip pain   Hip Extension Stengthening;Both;2 sets;5 reps  Did not finish sets due to increased hip pain     Knee/Hip Exercises: Supine   Straight Leg Raises Strengthening;Both;2 sets;10 reps     Knee/Hip Exercises: Sidelying   Hip ABduction Strengthening;Both;2 sets;10 reps   Hip ADduction Strengthening;Both;2 sets;10 reps     Knee/Hip Exercises: Prone   Hip Extension Strengthening;Both;2 sets;10 reps     Moist Heat Therapy   Number Minutes Moist Heat 15 Minutes   Moist Heat Location Lumbar Spine     Electrical Stimulation   Electrical Stimulation Location Bil gluteals   Electrical  Stimulation Action IFC   Electrical Stimulation Parameters To tolerance   Electrical Stimulation Goals Pain     Manual Therapy   Manual Therapy Soft tissue mobilization   Manual therapy comments IASTM    Passive ROM Bil IT band, glutes, and hamstrings                  PT Short Term Goals - 11/17/15 1723      PT SHORT TERM GOAL #1   Title independent with initial HEP   Time 4   Period Weeks   Status Achieved     PT SHORT TERM GOAL #2   Title understand correct body mechanics and sleeping positions to reduce strain on back and hips   Time 4   Period Weeks   Status Achieved     PT SHORT TERM GOAL #3   Title pain with daily activities reduced >/= 25%   Time 4   Period Weeks   Status Achieved     PT SHORT TERM GOAL #4   Title pain with sitting and walking decreased >/= 25%   Time 4   Period Weeks   Status Achieved           PT Long Term Goals - 11/17/15 1723      PT LONG TERM GOAL #1   Title independent with HEP    Time 8   Period Weeks   Status On-going     PT LONG TERM GOAL #2   Title bil. hip strength >/= 4+/5 so she is able to stand or walk for 1 hour in a store   Time 8   Period Weeks   Status On-going     PT LONG TERM GOAL #3   Title sit for 2 hour to see a movie with pain decreased >/= 75%   Time 8   Period Weeks   Status Achieved     PT LONG TERM GOAL #4   Title drive for 30 min with pain in right leg decreased >/= 75%   Time 8   Period Weeks   Status Achieved     PT LONG TERM GOAL #5   Title FOTO score </= 43% limitation   Time 8   Period Weeks   Status On-going     PT LONG TERM GOAL #6   Title be able to perform daily home tasks with >/= 75% greater ease due to reduction in pain   Time 8   Period Weeks   Status Achieved  PT LONG TERM GOAL #7   Title shave legs with >/= 75% greater ease due to increase mobility and reduction in pain   Time 8   Period Weeks   Status On-going               Plan - 11/20/15  1547    Clinical Impression Statement Pt had some increased pain in Bil hips that increased with standing exercises. Pt able to tolerate matt exercises well. Had some tenderness in Bil hips that decreased with manaul techniques. Pt will continue to benefit from skilled therapy for increase core and LE strengthening.    Rehab Potential (P)  Excellent   Clinical Impairments Affecting Rehab Potential (P)  NOne   PT Frequency (P)  2x / week   PT Duration (P)  8 weeks   PT Treatment/Interventions (P)  ADLs/Self Care Home Management;Cryotherapy;Electrical Stimulation;Iontophoresis 4mg /ml Dexamethasone;Moist Heat;Therapeutic activities;Therapeutic exercise;Neuromuscular re-education;Patient/family education;Passive range of motion;Manual techniques;Dry needling   PT Next Visit Plan (P)  Continue ionto #5, hip and core strength   Consulted and Agree with Plan of Care (P)  Patient      Patient will benefit from skilled therapeutic intervention in order to improve the following deficits and impairments:  (P) Pain, Decreased strength, Decreased mobility, Decreased balance, Impaired flexibility, Improper body mechanics, Decreased activity tolerance, Decreased endurance, Decreased range of motion  Visit Diagnosis: Muscle weakness (generalized)  Pain in right hip  Pain in left hip  Acute midline low back pain without sciatica  Chronic midline low back pain without sciatica     Problem List Patient Active Problem List   Diagnosis Date Noted  . Severe recurrent major depression without psychotic features ([place])   . Suicidal ideation   . Chronic cholecystitis with calculus 09/27/2011  . Hx of colon cancer, stage II 08/24/2011  . Hyperlipidemia 08/24/2011  . Anxiety and depression 08/24/2011    Mikle Bosworth PTA 11/20/2015, 5:11 PM  [facility] [address] [place], [place], [address] Phone: [phone]   Fax:   [phone]  Name: Lorraine May MRN: [id number] Date of Birth: 1956/01/05

## 2015-11-24 ENCOUNTER — Ambulatory Visit: Payer: BLUE CROSS/BLUE SHIELD

## 2015-11-24 DIAGNOSIS — M545 Low back pain, unspecified: Secondary | ICD-10-CM

## 2015-11-24 DIAGNOSIS — M25552 Pain in left hip: Secondary | ICD-10-CM

## 2015-11-24 DIAGNOSIS — M6281 Muscle weakness (generalized): Secondary | ICD-10-CM

## 2015-11-24 DIAGNOSIS — M25551 Pain in right hip: Secondary | ICD-10-CM | POA: Diagnosis not present

## 2015-11-24 DIAGNOSIS — G8929 Other chronic pain: Secondary | ICD-10-CM | POA: Diagnosis not present

## 2015-11-24 NOTE — Therapy (Signed)
[facility] [address], [place], [place], [address] Phone: [phone]   Fax:  [phone]  Physical Therapy Treatment  Patient Details  Name: Lorraine May MRN: [id number] Date of Birth: [birth year] Referring Provider: Dr. Darcus Austin  Encounter Date: 11/24/2015      PT End of Session - 11/24/15 1444    Visit Number 14   Date for PT Re-Evaluation 12/03/15   Authorization Type Patient states she is over her PT limits per insurance but is OK with self pay   PT Start Time 1400   PT Stop Time 1500   PT Time Calculation (min) 60 min   Activity Tolerance Patient tolerated treatment well   Behavior During Therapy [facility] for tasks assessed/performed      Past Medical History:  Diagnosis Date  . Anxiety   . Anxiety and depression 08/24/2011  . Arthritis   . Cancer ([place])    colon  . Depression   . Hx of colon cancer, stage II 08/24/2011   T3N0  adenoca cecum resected 04/2004  Xeloda adjuvant chemo  . Hyperlipidemia 08/24/2011  . Joint pain   . Nausea & vomiting   . PONV (postoperative nausea and vomiting)     Past Surgical History:  Procedure Laterality Date  . ABDOMINAL HYSTERECTOMY  2004  . BREAST SURGERY  2011   reduction  . CHOLECYSTECTOMY  10/05/2011   Procedure: LAPAROSCOPIC CHOLECYSTECTOMY WITH INTRAOPERATIVE CHOLANGIOGRAM;  Surgeon: Stark Klein, MD;  Location: [place];  Service: General;  Laterality: N/A;  . [place]  . HEMICOLECTOMY Right 2006  . HERNIA REPAIR  2007  . KNEE ARTHROSCOPY  2010  . NASAL SEPTUM SURGERY  1983  . OTHER SURGICAL HISTORY  2016   Transcranial magnetic stimulation  . PORT-A-CATH REMOVAL  2006   insertion and removal  . STRABISMUS SURGERY  1962    There were no vitals filed for this visit.      Subjective Assessment - 11/24/15 1401    Subjective Having discomfort today in the low back and Lt hip.  Some days are good and some days are bad.  Pt reports  70% overall improvement since the start of care.     Patient Stated Goals exercises at home to manage pain and resolve   Currently in Pain? Yes   Pain Score 4    Pain Location Hip   Pain Orientation Left   Pain Descriptors / Indicators Aching   Pain Type Chronic pain   Pain Onset More than a month ago   Pain Frequency Intermittent   Aggravating Factors  sitting in the car, standing long periods, sometimes unpredictable   Pain Relieving Factors heat, manual therapy   Pain Score 4   Pain Location Back   Pain Orientation Lower;Right;Left   Pain Descriptors / Indicators Discomfort;Constant;Aching   Pain Type Chronic pain   Pain Onset More than a month ago   Pain Frequency Constant   Aggravating Factors  sitting, standing too long   Pain Relieving Factors unknown at times                         [facility] Adult PT Treatment/Exercise - 11/24/15 0001      Lumbar Exercises: Stretches   Active Hamstring Stretch 3 reps;20 seconds   Piriformis Stretch 3 reps;20 seconds     Lumbar Exercises: Aerobic   Stationary Bike Level 1 x 8 minutes  PT present  for pain and goal assessment     Lumbar Exercises: Standing   Row Strengthening;Both;20 reps  15#   Shoulder Extension Strengthening;20 reps  15#     Lumbar Exercises: Supine   Ab Set 20 reps  with red ball   Clam 20 reps;Other (comment);2 seconds  with red band   Bridge 20 reps   Straight Leg Raise 20 reps     Knee/Hip Exercises: Supine   Straight Leg Raises Strengthening;Both;2 sets;10 reps     Knee/Hip Exercises: Prone   Hip Extension Strengthening;Both;2 sets;10 reps     Modalities   Modalities Iontophoresis;Electrical Stimulation;Moist Heat     Moist Heat Therapy   Number Minutes Moist Heat 15 Minutes   Moist Heat Location Lumbar Spine     Electrical Stimulation   Electrical Stimulation Location bil gluteals and low back   Electrical Stimulation Action IFC   Electrical Stimulation Parameters 15 minutes    Electrical Stimulation Goals Pain     Iontophoresis   Type of Iontophoresis Dexamethasone   Location bilateral trochanteric bursa   Dose 1.0 cc each  #5   Time 4-6 hour patch                  PT Short Term Goals - 11/17/15 1723      PT SHORT TERM GOAL #1   Title independent with initial HEP   Time 4   Period Weeks   Status Achieved     PT SHORT TERM GOAL #2   Title understand correct body mechanics and sleeping positions to reduce strain on back and hips   Time 4   Period Weeks   Status Achieved     PT SHORT TERM GOAL #3   Title pain with daily activities reduced >/= 25%   Time 4   Period Weeks   Status Achieved     PT SHORT TERM GOAL #4   Title pain with sitting and walking decreased >/= 25%   Time 4   Period Weeks   Status Achieved           PT Long Term Goals - 11/24/15 1411      PT LONG TERM GOAL #1   Title independent with HEP    Time 8   Period Weeks   Status On-going     PT LONG TERM GOAL #2   Title bil. hip strength >/= 4+/5 so she is able to stand or walk for 1 hour in a store   Time 8   Period Weeks   Status On-going     PT LONG TERM GOAL #3   Title sit for 2 hour to see a movie with pain decreased >/= 75%     PT LONG TERM GOAL #4   Title drive for 30 min with pain in right leg decreased >/= 75%   Status Achieved     PT LONG TERM GOAL #5   Title FOTO score </= 43% limitation   Time 8   Period Weeks   Status On-going     PT LONG TERM GOAL #6   Title be able to perform daily home tasks with >/= 75% greater ease due to reduction in pain   Status Achieved     PT LONG TERM GOAL #7   Title shave legs with >/= 75% greater ease due to increase mobility and reduction in pain   Time 8   Period Weeks   Status On-going  Plan - 11/24/15 1412    Clinical Impression Statement Pt reports 70% overall improvement in symtpoms since the start of care.  Pt tolerated all exercise in the clinic without difficulty  today.  Pt limited to walking 30 minutes as she is fearful to flare up her bursitis.  Pt with pain standing on the Lt LE so still with difficulty shaving legs.  Pt will benefit from PT for 2 more weeks to finalize/advance HEP.   Rehab Potential Excellent   PT Frequency 2x / week   PT Duration 8 weeks   PT Treatment/Interventions ADLs/Self Care Home Management;Cryotherapy;Electrical Stimulation;Iontophoresis 4mg /ml Dexamethasone;Moist Heat;Therapeutic activities;Therapeutic exercise;Neuromuscular re-education;Patient/family education;Passive range of motion;Manual techniques;Dry needling   PT Next Visit Plan Continue ionto #6, hip and core strength   Consulted and Agree with Plan of Care Patient      Patient will benefit from skilled therapeutic intervention in order to improve the following deficits and impairments:  Pain, Decreased strength, Decreased mobility, Decreased balance, Impaired flexibility, Improper body mechanics, Decreased activity tolerance, Decreased endurance, Decreased range of motion  Visit Diagnosis: Muscle weakness (generalized)  Pain in right hip  Pain in left hip  Acute midline low back pain without sciatica     Problem List Patient Active Problem List   Diagnosis Date Noted  . Severe recurrent major depression without psychotic features ([place])   . Suicidal ideation   . Chronic cholecystitis with calculus 09/27/2011  . Hx of colon cancer, stage II 08/24/2011  . Hyperlipidemia 08/24/2011  . Anxiety and depression 08/24/2011     Sigurd Sos, PT 11/24/15 2:45 PM  [facility] [address], [place], [place], [address] Phone: [phone]   Fax:  [phone]  Name: Lorraine May MRN: [id number] Date of Birth: 1955-10-20

## 2015-11-25 DIAGNOSIS — F4310 Post-traumatic stress disorder, unspecified: Secondary | ICD-10-CM | POA: Diagnosis not present

## 2015-11-25 DIAGNOSIS — F341 Dysthymic disorder: Secondary | ICD-10-CM | POA: Diagnosis not present

## 2015-11-25 DIAGNOSIS — F606 Avoidant personality disorder: Secondary | ICD-10-CM | POA: Diagnosis not present

## 2015-11-25 DIAGNOSIS — F411 Generalized anxiety disorder: Secondary | ICD-10-CM | POA: Diagnosis not present

## 2015-11-27 ENCOUNTER — Encounter: Payer: Self-pay | Admitting: Physical Therapy

## 2015-11-27 ENCOUNTER — Ambulatory Visit: Payer: BLUE CROSS/BLUE SHIELD | Admitting: Physical Therapy

## 2015-11-27 ENCOUNTER — Ambulatory Visit: Payer: BLUE CROSS/BLUE SHIELD | Admitting: Neurology

## 2015-11-27 DIAGNOSIS — M545 Low back pain, unspecified: Secondary | ICD-10-CM

## 2015-11-27 DIAGNOSIS — M25551 Pain in right hip: Secondary | ICD-10-CM | POA: Diagnosis not present

## 2015-11-27 DIAGNOSIS — M6281 Muscle weakness (generalized): Secondary | ICD-10-CM | POA: Diagnosis not present

## 2015-11-27 DIAGNOSIS — G8929 Other chronic pain: Secondary | ICD-10-CM | POA: Diagnosis not present

## 2015-11-27 DIAGNOSIS — M25552 Pain in left hip: Secondary | ICD-10-CM

## 2015-11-27 NOTE — Therapy (Signed)
[facility] [address] [place], [place], [address] Phone: [phone]   Fax:  [phone]  Physical Therapy Treatment  Patient Details  Name: Lorraine May MRN: [id number] Date of Birth: [birth year] Referring Provider: Dr. Darcus Austin  Encounter Date: 11/27/2015      PT End of Session - 11/27/15 1401    Visit Number 15   Date for PT Re-Evaluation 12/03/15   Authorization Type Patient states she is over her PT limits per insurance but is OK with self pay   PT Start Time 1358   PT Stop Time 1500   PT Time Calculation (min) 62 min   Activity Tolerance Patient tolerated treatment well   Behavior During Therapy [facility] for tasks assessed/performed      Past Medical History:  Diagnosis Date  . Anxiety   . Anxiety and depression 08/24/2011  . Arthritis   . Cancer ([place])    colon  . Depression   . Hx of colon cancer, stage II 08/24/2011   T3N0  adenoca cecum resected 04/2004  Xeloda adjuvant chemo  . Hyperlipidemia 08/24/2011  . Joint pain   . Nausea & vomiting   . PONV (postoperative nausea and vomiting)     Past Surgical History:  Procedure Laterality Date  . ABDOMINAL HYSTERECTOMY  2004  . BREAST SURGERY  2011   reduction  . CHOLECYSTECTOMY  10/05/2011   Procedure: LAPAROSCOPIC CHOLECYSTECTOMY WITH INTRAOPERATIVE CHOLANGIOGRAM;  Surgeon: Stark Klein, MD;  Location: [facility];  Service: General;  Laterality: N/A;  . [place]  . HEMICOLECTOMY Right 2006  . HERNIA REPAIR  2007  . KNEE ARTHROSCOPY  2010  . NASAL SEPTUM SURGERY  1983  . OTHER SURGICAL HISTORY  2016   Transcranial magnetic stimulation  . PORT-A-CATH REMOVAL  2006   insertion and removal  . STRABISMUS SURGERY  1962    There were no vitals filed for this visit.      Subjective Assessment - 11/27/15 1405    Subjective Pt continues to have 3/10 pain. Has increase pain when cleaning shower and bending. Also increased pain with  driving and pressing on gas pedel.    Currently in Pain? Yes   Pain Score 3    Pain Location Hip   Pain Orientation Left   Pain Descriptors / Indicators Aching   Pain Type Chronic pain   Multiple Pain Sites No                         [facility] Adult PT Treatment/Exercise - 11/27/15 0001      Lumbar Exercises: Stretches   Active Hamstring Stretch 3 reps;20 seconds   Quad Stretch 3 reps;20 seconds  hip flexor edge of mat   Piriformis Stretch 3 reps;20 seconds     Lumbar Exercises: Aerobic   Stationary Bike Level 1 x 8 minutes  PT present for pain and goal assessment     Lumbar Exercises: Standing   Row Strengthening;Both;20 reps  15#   Shoulder Extension Strengthening;20 reps  15#     Lumbar Exercises: Supine   Ab Set 20 reps  with red ball     Lumbar Exercises: Prone   Other Prone Lumbar Exercises Childs pose     Knee/Hip Exercises: Supine   Straight Leg Raises Strengthening;Both;2 sets;10 reps   Other Supine Knee/Hip Exercises Resisted walking  with abdominal brace     Knee/Hip Exercises: Sidelying   Hip ABduction  Strengthening;Both;2 sets;10 reps   Hip ADduction Strengthening;Both;2 sets;10 reps     Knee/Hip Exercises: Prone   Hip Extension Strengthening;Both;2 sets;10 reps     Moist Heat Therapy   Number Minutes Moist Heat 15 Minutes   Moist Heat Location Lumbar Spine     Electrical Stimulation   Electrical Stimulation Location Bil glutes   Electrical Stimulation Action IFC   Electrical Stimulation Parameters 15 minutes   Electrical Stimulation Goals Pain                  PT Short Term Goals - 11/17/15 1723      PT SHORT TERM GOAL #1   Title independent with initial HEP   Time 4   Period Weeks   Status Achieved     PT SHORT TERM GOAL #2   Title understand correct body mechanics and sleeping positions to reduce strain on back and hips   Time 4   Period Weeks   Status Achieved     PT SHORT TERM GOAL #3   Title pain with  daily activities reduced >/= 25%   Time 4   Period Weeks   Status Achieved     PT SHORT TERM GOAL #4   Title pain with sitting and walking decreased >/= 25%   Time 4   Period Weeks   Status Achieved           PT Long Term Goals - 11/24/15 1411      PT LONG TERM GOAL #1   Title independent with HEP    Time 8   Period Weeks   Status On-going     PT LONG TERM GOAL #2   Title bil. hip strength >/= 4+/5 so she is able to stand or walk for 1 hour in a store   Time 8   Period Weeks   Status On-going     PT LONG TERM GOAL #3   Title sit for 2 hour to see a movie with pain decreased >/= 75%     PT LONG TERM GOAL #4   Title drive for 30 min with pain in right leg decreased >/= 75%   Status Achieved     PT LONG TERM GOAL #5   Title FOTO score </= 43% limitation   Time 8   Period Weeks   Status On-going     PT LONG TERM GOAL #6   Title be able to perform daily home tasks with >/= 75% greater ease due to reduction in pain   Status Achieved     PT LONG TERM GOAL #7   Title shave legs with >/= 75% greater ease due to increase mobility and reduction in pain   Time 8   Period Weeks   Status On-going               Plan - 11/27/15 1446    Clinical Impression Statement Pt reports pain being better. Pt continues to be limited with standing exercises due to increase hip pain in single stand weight bearing but able to tolerate all mat exercises well and really enjoyed all stretching. Pt will continue to benefit from skilled therapy for LE and core strengthening.    Rehab Potential Excellent   Clinical Impairments Affecting Rehab Potential None   PT Frequency 2x / week   PT Duration 8 weeks   PT Treatment/Interventions ADLs/Self Care Home Management;Cryotherapy;Electrical Stimulation;Iontophoresis 4mg /ml Dexamethasone;Moist Heat;Therapeutic activities;Therapeutic exercise;Neuromuscular re-education;Patient/family education;Passive range of motion;Manual techniques;Dry  needling  PT Next Visit Plan Hip and core strength and stretching.    PT Home Exercise Plan hip strengthening      Patient will benefit from skilled therapeutic intervention in order to improve the following deficits and impairments:  Pain, Decreased strength, Decreased mobility, Decreased balance, Impaired flexibility, Improper body mechanics, Decreased activity tolerance, Decreased endurance, Decreased range of motion  Visit Diagnosis: Muscle weakness (generalized)  Pain in right hip  Pain in left hip  Acute midline low back pain without sciatica     Problem List Patient Active Problem List   Diagnosis Date Noted  . Severe recurrent major depression without psychotic features ([place])   . Suicidal ideation   . Chronic cholecystitis with calculus 09/27/2011  . Hx of colon cancer, stage II 08/24/2011  . Hyperlipidemia 08/24/2011  . Anxiety and depression 08/24/2011    Mikle Bosworth PTA 11/27/2015, 3:49 PM  [facility] [address] [place], [place], [address] Phone: [phone]   Fax:  [phone]  Name: Lorraine May MRN: [id number] Date of Birth: 01/13/1956

## 2015-11-28 DIAGNOSIS — F411 Generalized anxiety disorder: Secondary | ICD-10-CM | POA: Diagnosis not present

## 2015-11-28 DIAGNOSIS — F341 Dysthymic disorder: Secondary | ICD-10-CM | POA: Diagnosis not present

## 2015-11-28 DIAGNOSIS — F4310 Post-traumatic stress disorder, unspecified: Secondary | ICD-10-CM | POA: Diagnosis not present

## 2015-11-28 DIAGNOSIS — F606 Avoidant personality disorder: Secondary | ICD-10-CM | POA: Diagnosis not present

## 2015-12-01 ENCOUNTER — Ambulatory Visit: Payer: BLUE CROSS/BLUE SHIELD | Admitting: Physical Therapy

## 2015-12-01 ENCOUNTER — Encounter: Payer: Self-pay | Admitting: Physical Therapy

## 2015-12-01 DIAGNOSIS — M545 Low back pain, unspecified: Secondary | ICD-10-CM

## 2015-12-01 DIAGNOSIS — M25552 Pain in left hip: Secondary | ICD-10-CM | POA: Diagnosis not present

## 2015-12-01 DIAGNOSIS — G8929 Other chronic pain: Secondary | ICD-10-CM | POA: Diagnosis not present

## 2015-12-01 DIAGNOSIS — M25551 Pain in right hip: Secondary | ICD-10-CM

## 2015-12-01 DIAGNOSIS — M6281 Muscle weakness (generalized): Secondary | ICD-10-CM | POA: Diagnosis not present

## 2015-12-01 NOTE — Therapy (Signed)
[facility] [address] [place], [place], [address] Phone: [phone]   Fax:  [phone]  Physical Therapy Treatment  Patient Details  Name: Lorraine May MRN: [id number] Date of Birth: 1955-03-16 Referring Provider: Dr. Darcus Austin  Encounter Date: 12/01/2015      PT End of Session - 12/01/15 1403    Visit Number 16   Date for PT Re-Evaluation 12/03/15   Authorization Type Patient states she is over her PT limits per insurance but is OK with self pay   PT Start Time 1358   PT Stop Time 1500   PT Time Calculation (min) 62 min   Activity Tolerance Patient tolerated treatment well   Behavior During Therapy [facility] for tasks assessed/performed      Past Medical History:  Diagnosis Date  . Anxiety   . Anxiety and depression 08/24/2011  . Arthritis   . Cancer ([place])    colon  . Depression   . Hx of colon cancer, stage II 08/24/2011   T3N0  adenoca cecum resected 04/2004  Xeloda adjuvant chemo  . Hyperlipidemia 08/24/2011  . Joint pain   . Nausea & vomiting   . PONV (postoperative nausea and vomiting)     Past Surgical History:  Procedure Laterality Date  . ABDOMINAL HYSTERECTOMY  2004  . BREAST SURGERY  2011   reduction  . CHOLECYSTECTOMY  10/05/2011   Procedure: LAPAROSCOPIC CHOLECYSTECTOMY WITH INTRAOPERATIVE CHOLANGIOGRAM;  Surgeon: Stark Klein, MD;  Location: [facility];  Service: General;  Laterality: N/A;  . [place]  . HEMICOLECTOMY Right 2006  . HERNIA REPAIR  2007  . KNEE ARTHROSCOPY  2010  . NASAL SEPTUM SURGERY  1983  . OTHER SURGICAL HISTORY  2016   Transcranial magnetic stimulation  . PORT-A-CATH REMOVAL  2006   insertion and removal  . STRABISMUS SURGERY  1962    There were no vitals filed for this visit.      Subjective Assessment - 12/01/15 1400    Subjective Pt continues to have low back pain. Will be seeing a neurologist this week.    Pertinent History mild to  moderate kidney disease   Limitations Sitting;Standing   Patient Stated Goals exercises at home to manage pain and resolve   Currently in Pain? Yes   Pain Score 4    Pain Location Hip   Pain Orientation Left   Pain Descriptors / Indicators Aching   Pain Type Chronic pain   Pain Onset More than a month ago   Pain Frequency Intermittent                         [facility] Adult PT Treatment/Exercise - 12/01/15 0001      Lumbar Exercises: Stretches   Active Hamstring Stretch 3 reps;20 seconds   Single Knee to Chest Stretch 2 reps;10 seconds   Double Knee to Chest Stretch 2 reps;10 seconds   Piriformis Stretch 3 reps;20 seconds     Lumbar Exercises: Aerobic   Stationary Bike Level 3 x 8 minutes  PT present for pain and goal assessment     Lumbar Exercises: Standing   Row Strengthening;Both;20 reps  15#   Shoulder Extension Strengthening;20 reps  15#     Lumbar Exercises: Supine   Ab Set 20 reps  with red ball     Knee/Hip Exercises: Supine   Straight Leg Raises Strengthening;Both;2 sets;10 reps  #2   Other Supine Knee/Hip  Exercises Resisted walking  5x each #25 forward #10 side     Knee/Hip Exercises: Sidelying   Hip ABduction Strengthening;Both;2 sets;10 reps  #2   Hip ADduction Strengthening;Both;2 sets;10 reps  #2     Knee/Hip Exercises: Prone   Hip Extension Strengthening;Both;2 sets;10 reps  #2     Modalities   Modalities --     Moist Heat Therapy   Number Minutes Moist Heat 15 Minutes   Moist Heat Location Lumbar Spine     Electrical Stimulation   Electrical Stimulation Location Bil glutes   Electrical Stimulation Action IFC   Electrical Stimulation Parameters 15 minutes   Electrical Stimulation Goals Pain                  PT Short Term Goals - 12/01/15 1403      PT SHORT TERM GOAL #1   Title independent with initial HEP   Time 4   Period Weeks   Status Achieved     PT SHORT TERM GOAL #2   Title understand correct body  mechanics and sleeping positions to reduce strain on back and hips   Time 4   Period Weeks   Status Achieved     PT SHORT TERM GOAL #3   Title pain with daily activities reduced >/= 25%   Time 4   Period Weeks   Status Achieved     PT SHORT TERM GOAL #4   Title pain with sitting and walking decreased >/= 25%   Time 4   Period Weeks   Status Achieved           PT Long Term Goals - 12/01/15 1404      PT LONG TERM GOAL #1   Title independent with HEP    Time 8   Period Weeks   Status On-going     PT LONG TERM GOAL #2   Title bil. hip strength >/= 4+/5 so she is able to stand or walk for 1 hour in a store   Time 8   Period Weeks   Status Achieved     PT LONG TERM GOAL #3   Title sit for 2 hour to see a movie with pain decreased >/= 75%   Time 8   Period Weeks   Status Achieved     PT LONG TERM GOAL #4   Title drive for 30 min with pain in right leg decreased >/= 75%   Time 8   Period Weeks   Status Achieved     PT LONG TERM GOAL #5   Title FOTO score </= 43% limitation   Time 8   Period Weeks   Status On-going     PT LONG TERM GOAL #6   Title be able to perform daily home tasks with >/= 75% greater ease due to reduction in pain   Time 8   Period Weeks   Status Achieved     PT LONG TERM GOAL #7   Title shave legs with >/= 75% greater ease due to increase mobility and reduction in pain   Time 8   Period Weeks   Status On-going               Plan - 12/01/15 1510    Clinical Impression Statement Pt able to toelrate all exericses with increase resistance well. Had some increase in hip pain with resisted walking sideways but able to complete. Pt will continue to benefit from skilled therapy for Bil strengthening and  core stability.    Rehab Potential Excellent   Clinical Impairments Affecting Rehab Potential None   PT Frequency 2x / week   PT Duration 8 weeks   PT Treatment/Interventions ADLs/Self Care Home Management;Cryotherapy;Electrical  Stimulation;Iontophoresis 4mg /ml Dexamethasone;Moist Heat;Therapeutic activities;Therapeutic exercise;Neuromuscular re-education;Patient/family education;Passive range of motion;Manual techniques;Dry needling   PT Next Visit Plan Hip and core strength and stretching.    Consulted and Agree with Plan of Care Patient      Patient will benefit from skilled therapeutic intervention in order to improve the following deficits and impairments:  Pain, Decreased strength, Decreased mobility, Decreased balance, Impaired flexibility, Improper body mechanics, Decreased activity tolerance, Decreased endurance, Decreased range of motion  Visit Diagnosis: Muscle weakness (generalized)  Pain in right hip  Pain in left hip  Acute midline low back pain without sciatica     Problem List Patient Active Problem List   Diagnosis Date Noted  . Severe recurrent major depression without psychotic features ([place])   . Suicidal ideation   . Chronic cholecystitis with calculus 09/27/2011  . Hx of colon cancer, stage II 08/24/2011  . Hyperlipidemia 08/24/2011  . Anxiety and depression 08/24/2011    Mikle Bosworth PTA 12/01/2015, 3:13 PM   [facility] [address], [place], [place], [address] Phone: [phone]   Fax:  [phone]  Name: Lorraine May MRN: [id number] Date of Birth: May 29, 1955

## 2015-12-02 DIAGNOSIS — F4310 Post-traumatic stress disorder, unspecified: Secondary | ICD-10-CM | POA: Diagnosis not present

## 2015-12-02 DIAGNOSIS — F341 Dysthymic disorder: Secondary | ICD-10-CM | POA: Diagnosis not present

## 2015-12-02 DIAGNOSIS — F411 Generalized anxiety disorder: Secondary | ICD-10-CM | POA: Diagnosis not present

## 2015-12-02 DIAGNOSIS — F606 Avoidant personality disorder: Secondary | ICD-10-CM | POA: Diagnosis not present

## 2015-12-03 ENCOUNTER — Ambulatory Visit (INDEPENDENT_AMBULATORY_CARE_PROVIDER_SITE_OTHER): Payer: BLUE CROSS/BLUE SHIELD | Admitting: Neurology

## 2015-12-03 ENCOUNTER — Ambulatory Visit: Payer: BLUE CROSS/BLUE SHIELD | Attending: Family Medicine

## 2015-12-03 ENCOUNTER — Encounter: Payer: Self-pay | Admitting: Neurology

## 2015-12-03 VITALS — BP 121/85 | HR 68 | Ht 64.5 in | Wt 173.6 lb

## 2015-12-03 DIAGNOSIS — G8929 Other chronic pain: Secondary | ICD-10-CM

## 2015-12-03 DIAGNOSIS — R29898 Other symptoms and signs involving the musculoskeletal system: Secondary | ICD-10-CM

## 2015-12-03 DIAGNOSIS — M25552 Pain in left hip: Secondary | ICD-10-CM

## 2015-12-03 DIAGNOSIS — M5440 Lumbago with sciatica, unspecified side: Secondary | ICD-10-CM | POA: Diagnosis not present

## 2015-12-03 DIAGNOSIS — M25551 Pain in right hip: Secondary | ICD-10-CM

## 2015-12-03 DIAGNOSIS — M6281 Muscle weakness (generalized): Secondary | ICD-10-CM | POA: Insufficient documentation

## 2015-12-03 DIAGNOSIS — M545 Low back pain, unspecified: Secondary | ICD-10-CM

## 2015-12-03 NOTE — Patient Instructions (Signed)
Remember to drink plenty of fluid, eat healthy meals and do not skip any meals. Try to eat protein with a every meal and eat a healthy snack such as fruit or nuts in between meals. Try to keep a regular sleep-wake schedule and try to exercise daily, particularly in the form of walking, 20-30 minutes a day, if you can.   As far as diagnostic testing: MRI of the lumbar spine  I would like to see you back in one year, sooner if we need to. Please call us with any interim questions, concerns, problems, updates or refill requests.   Our phone number is [phone]. We also have an after hours call service for urgent matters and there is a physician on-call for urgent questions. For any emergencies you know to call 911 or go to the nearest emergency room

## 2015-12-03 NOTE — Progress Notes (Signed)
[facility]   Provider:  Dr Jaynee Eagles Referring Provider: Melina Schools Primary Care Physician:  Marjorie Smolder, MD  CC:  Chronic headaches  Interval history: MRI of the brain and thoracic spine were unremarkable. A year ago she pushed a box with her foot, pain exploded up her leg, thought she pulled a muscle, she was diagnosed with bursitis, She saw orthopaedics and diagnosed with bursitis again, an injection made her whole leg go numb, She has hip and lower back pain. She has done physical therapy. [facility]. She is stretching and strengthening. She has lower back pain with radiation down both legs. She has continued weakness.   HPI:  Lorraine May is a very nice 60 y.o. female here as a referral from Dr. Inda Merlin for chronic headaches. PMHx major depression, generalized anxiety, headaches, PTSD, Stage 3 kidney disease, Osteopenia, colon caner s/p right hemicolectomy 2006. She has headaches for a year, no inciting events or head trauma but she was trying all kinds "loads" of medications for her mood disorder. Never had headaches int he past or any history of migraines. She has multiple types of headaches, occasionally will get ice pick in the right temporal area which occurs every several weeks and is brief, no eye watering or nose running but very painful, no time of day preference. She has a headache right now on the top of her head and behind the eyes, she saw an ophthalmologist who sent her stat for temporal arteritis but labwork was negative. 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 The headache is a pressure headache, more pressure and pain, consistent and continuous over 4-5 hours and a few tylenol help, she takes tylenol 3-4 times a week. She had TMS therapy for depression last year and this resulted in headaches. Headaches most days of the week,she wakes up with headaches. She snores mildly. She had an MRI of the brain but may have started  after the MRI last April. The clonidine was started about the same time as the headaches. She has no significant daytime somnolence and no other signs of obstructive sleep apnea. No other focal neurologic deficits or complaints. No double vision, no aphasia, no dysarthria, no weakness, no sensory changes. No bowel or bladder changes. No fever or systemic symptoms. She also has a lot of tightness in the neck associated with headaches.  Reviewed notes, labs and imaging from outside physicians, which showed: Patient brings in a CD with the images, personally reviewed. MRI of the cervical spine was largely unremarkable, at C4-C5 minimal central canal and moderate right foraminal stenosis. At C5-C6 minimal central canal and moderate right foraminal stenosis.  Patient was referred by [facility] orthopedics. She is seeing them for neck pain. Neck Stiffness and headaches. Symptoms exacerbated by turning the head to the right and turning the head to the left. Treated with non-opioid analgesics. Patient also reported low back pain. Mild neck discomfort, worse with range of motion and deep palpation. Exam significant for 10 beats of clonus in the lower extremities bilaterally, 3+ brisk deep tendon reflexes at the knees, Achilles, biceps, triceps and brachial radialis. Toes are flexor. Negative Hoffman sign. Normal gait pattern. Slight limp when she walks because of right-sided gluteal and lateral hip pain that she is not ataxic and she has no significant imbalance. Negative Romberg. MRI reviewed showed no cord signal changes. Mild multilevel degenerative disease no significant high-grade neural compression. No evidence to suggest cervical spondylitic myelopathy.  BMP unremarkable creatinine slightly elevated 1.02, CBC normal  Review of Systems: Patient complains of symptoms per HPI as well as the following symptoms: blind in right eye, swelling in legs, snoring, diarrhea, urination problems, feeling cold, joint  pain, aching muscles, allergies, skin sensitivity, memory loss, headache, insommnia, depression, anxiety. Pertinent negatives per HPI. All others negative.   Social History   Social History  . Marital status: Married    Spouse name: Herbie Baltimore  . Number of children: 1  . Years of education: 77   Occupational History  . Accounting- Retired    Social History Main Topics  . Smoking status: Never Smoker  . Smokeless tobacco: Never Used  . Alcohol use Yes     Comment: occ  . Drug use: No  . Sexual activity: Not on file   Other Topics Concern  . Not on file   Social History Narrative   Lives with husband   Caffeine use: 2-3 cups per day    Family History  Problem Relation Age of Onset  . Diabetes Mother     type 1  . Stroke Mother   . Dementia Mother   . Thyroid disease Mother   . Alcohol abuse Mother   . Asthma Mother   . Alcohol abuse Father   . Diabetes Father   . Cancer Paternal Aunt     germ cell  . Cancer Maternal Grandmother     colon  . Cancer Maternal Uncle     throat  . Thyroid disease Sister   . Alcohol abuse Brother   . Psychiatric Illness Brother   . Psychiatric Illness Sister   . Asthma Sister     Past Medical History:  Diagnosis Date  . Anxiety   . Anxiety and depression 08/24/2011  . Arthritis   . Cancer ([place])    colon  . Depression   . Hx of colon cancer, stage II 08/24/2011   T3N0  adenoca cecum resected 04/2004  Xeloda adjuvant chemo  . Hyperlipidemia 08/24/2011  . Joint pain   . Nausea & vomiting   . PONV (postoperative nausea and vomiting)     Past Surgical History:  Procedure Laterality Date  . ABDOMINAL HYSTERECTOMY  2004  . BREAST SURGERY  2011   reduction  . CHOLECYSTECTOMY  10/05/2011   Procedure: LAPAROSCOPIC CHOLECYSTECTOMY WITH INTRAOPERATIVE CHOLANGIOGRAM;  Surgeon: Stark Klein, MD;  Location: [facility];  Service: General;  Laterality: N/A;  . [place]  . HEMICOLECTOMY Right 2006  . HERNIA REPAIR  2007    . KNEE ARTHROSCOPY  2010  . NASAL SEPTUM SURGERY  1983  . OTHER SURGICAL HISTORY  2016   Transcranial magnetic stimulation  . PORT-A-CATH REMOVAL  2006   insertion and removal  . STRABISMUS SURGERY  1962    Current Outpatient Prescriptions  Medication Sig Dispense Refill  . ALPRAZolam (XANAX XR) 0.5 MG 24 hr tablet Take 0.5 mg by mouth daily.    . busPIRone (BUSPAR) 15 MG tablet Take 20 mg by mouth 2 (two) times daily.     . cholestyramine (QUESTRAN) 4 G packet Take 1 packet by mouth daily.    Marland Kitchen dicyclomine (BENTYL) 20 MG tablet Take 20 mg by mouth as needed for spasms.     . ergocalciferol (VITAMIN D2) 50000 UNITS capsule Take 50,000 Units by mouth once a week. Take on sundays    . ESTRACE VAGINAL 0.1 MG/GM vaginal cream Place 1 Applicatorful vaginally once a week.     Marland Kitchen  lamoTRIgine (LAMICTAL) 200 MG tablet Take 200 mg by mouth daily.    . simvastatin (ZOCOR) 40 MG tablet Take 40 mg by mouth every evening.    . zolpidem (AMBIEN CR) 6.25 MG CR tablet Take 6.25 mg by mouth at bedtime.   0   No current facility-administered medications for this visit.     Allergies as of 12/03/2015 - Review Complete 12/03/2015  Allergen Reaction Noted  . Dilaudid [hydromorphone hcl] Itching 08/24/2011  . Fentanyl Itching 08/22/2012  . Morphine and related Itching 08/24/2011  . Percocet [oxycodone-acetaminophen]  08/21/2013  . Tetracyclines & related Nausea Only 08/24/2011  . Sulfa antibiotics Rash 08/24/2011    Vitals: BP 121/85 (BP Location: Right Arm, Patient Position: Sitting, Cuff Size: Normal)   Pulse 68   Ht 5' 4.5" (1.638 m)   Wt 173 lb 9.6 oz (78.7 kg)   SpO2 98%   BMI 29.34 kg/m  Last Weight:  Wt Readings from Last 1 Encounters:  12/03/15 173 lb 9.6 oz (78.7 kg)   Last Height:   Ht Readings from Last 1 Encounters:  12/03/15 5' 4.5" (1.638 m)    Physical exam: Exam: Gen: NAD, conversant, well nourised, overweight , well groomed                     CV: RRR, no MRG. No  Carotid Bruits. No peripheral edema, warm, nontender Eyes: Conjunctivae clear without exudates or hemorrhage  Neuro: Detailed Neurologic Exam  Speech:    Speech is normal; fluent and spontaneous with normal comprehension.  Cognition:    The patient is oriented to person, place, and time;     recent and remote memory intact;     language fluent;     normal attention, concentration,     fund of knowledge Cranial Nerves:    The pupils are equal, round, and reactive to light. Attempted funduscopic exam could not visualize. Visual fields are full to finger confrontation. Extraocular movements are intact. Trigeminal sensation is intact and the muscles of mastication are normal. The face is symmetric. The palate elevates in the midline. Hearing intact. Voice is normal. Shoulder shrug is normal. The tongue has normal motion without fasciculations.   Coordination:    Normal finger to nose and heel to shin. Normal rapid alternating movements.   Gait:    Heel-toe and tandem gait are normal.   Motor Observation:    No asymmetry, no atrophy, and no involuntary movements noted. Tone:    Normal muscle tone.    Posture:    Posture is normal. normal erect    Strength: Left hip flexion 4+/5.  Strength is V/V in the upper and lower limbs.      Sensation: intact to LT     Reflex Exam:  DTR's:    Deep tendon reflexes in the upper and lower extremities are brisk in the uppers with clonus in the lowers bilaterally.   Toes:    The toes are downgoing bilaterally.   Clonus:    Clonus in the bilat patellars and ankles. .      Assessment/Plan:  Very nice 60 y.o. female here as a referral from Dr. Rolena Infante for chronic headaches and low back pain. PMHx major depression, generalized anxiety, headaches, PTSD, Stage 3 kidney disease, Osteopenia, colon caner s/p right hemicolectomy 2006. She has headaches for a year in the setting of medical management for severe refractory depression. One  medication that may be the culprit is clonidine which can cause headaches  and was started around the same time. Patient also has some interesting physical findings including bilateral lower extremity clonus at the patellars and the ankle jerks but imaging of the brain, cervical and thoracic spine unremarkable for UMN disease will follow clinically.   Stopping the clonidine resolved the headaches MRI of the lumbar spine to evaluate for nerve root impingement and possible epidural steroid injections or surgical intervention  To prevent or relieve headaches, try the following:  Cool Compress. Lie down and place a cool compress on your head.   Avoid headache triggers. If certain foods or odors seem to have triggered your migraines in the past, avoid them. A headache diary might help you identify triggers.   Include physical activity in your daily routine. Try a daily walk or other moderate aerobic exercise.   Manage stress. Find healthy ways to cope with the stressors, such as delegating tasks on your to-do list.   Practice relaxation techniques. Try deep breathing, yoga, massage and visualization.   Eat regularly. Eating regularly scheduled meals and maintaining a healthy diet might help prevent headaches. Also, drink plenty of fluids.   Follow a regular sleep schedule. Sleep deprivation might contribute to headaches  Consider biofeedback. With this mind-body technique, you learn to control certain bodily functions - such as muscle tension, heart rate and blood pressure - to prevent headaches or reduce headache pain.    Proceed to emergency room if you experience new or worsening symptoms or symptoms do not resolve, if you have new neurologic symptoms or if headache is severe, or for any concerning symptom.  Sarina Ill, MD  [facility] [address] [place], [place] [address]  Phone [phone] Fax [phone]  A total of 30 minutes was spent  face-to-face with this patient. Over half this time was spent on counseling patient on the lumbar radiculopathy, medication indused headache diagnosis and different diagnostic and therapeutic options available.

## 2015-12-03 NOTE — Therapy (Signed)
[facility] [address] [place], [place], [address] Phone: [phone]   Fax:  [phone]  Physical Therapy Treatment  Patient Details  Name: Lorraine May MRN: [id number] Date of Birth: 02-09-1955 Referring Provider: Dr. Darcus Austin  Encounter Date: 12/03/2015      PT End of Session - 12/03/15 1225    Visit Number [id number]   Authorization Type Patient states she is over her PT limits per insurance but is OK with self pay   PT Start Time 1150   PT Stop Time 1240   PT Time Calculation (min) 50 min   Activity Tolerance Patient tolerated treatment well   Behavior During Therapy [facility] for tasks assessed/performed      Past Medical History:  Diagnosis Date  . Anxiety   . Anxiety and depression 08/24/2011  . Arthritis   . Cancer ([place])    colon  . Depression   . Hx of colon cancer, stage II 08/24/2011   T3N0  adenoca cecum resected 04/2004  Xeloda adjuvant chemo  . Hyperlipidemia 08/24/2011  . Joint pain   . Nausea & vomiting   . PONV (postoperative nausea and vomiting)     Past Surgical History:  Procedure Laterality Date  . ABDOMINAL HYSTERECTOMY  2004  . BREAST SURGERY  2011   reduction  . CHOLECYSTECTOMY  10/05/2011   Procedure: LAPAROSCOPIC CHOLECYSTECTOMY WITH INTRAOPERATIVE CHOLANGIOGRAM;  Surgeon: Stark Klein, MD;  Location: [place];  Service: General;  Laterality: N/A;  . [place]  . HEMICOLECTOMY Right 2006  . HERNIA REPAIR  2007  . KNEE ARTHROSCOPY  2010  . NASAL SEPTUM SURGERY  1983  . OTHER SURGICAL HISTORY  2016   Transcranial magnetic stimulation  . PORT-A-CATH REMOVAL  2006   insertion and removal  . STRABISMUS SURGERY  1962    There were no vitals filed for this visit.      Subjective Assessment - 12/03/15 1157    Subjective Pt is ready for D/C to HEP.  Pt reports that she feels 60% overall improvement.     Currently in Pain? Yes   Pain Score 4    Pain Location  Buttocks  Low back   Pain Orientation Left;Right   Pain Descriptors / Indicators Aching   Pain Type Chronic pain   Pain Onset More than a month ago   Pain Frequency Intermittent   Aggravating Factors  bending over, standing too long, weightbearing through hips, sidelying   Pain Relieving Factors heat, ice, Tylenol            [facility] PT Assessment - 12/03/15 0001      Assessment   Medical Diagnosis M70.60 Trochanteric bursitis, unspecified hip     Observation/Other Assessments   Focus on Therapeutic Outcomes (FOTO)  63% limitation     Strength   Overall Strength Deficits   Right/Left Hip Right;Left   Right Hip Flexion 4+/5   Right Hip Extension 4/5   Right Hip ABduction 4+/5   Left Hip Flexion 4/5   Left Hip Extension 4-/5   Left Hip ABduction 4/5                     [facility] Adult PT Treatment/Exercise - 12/03/15 0001      Lumbar Exercises: Stretches   Active Hamstring Stretch 3 reps;20 seconds     Lumbar Exercises: Aerobic   Stationary Bike Level 3 x 10 minutes  PT present for pain and  goal assessment     Lumbar Exercises: Standing   Row Strengthening;Both;20 reps  15#   Shoulder Extension Strengthening;20 reps  15#     Knee/Hip Exercises: Supine   Straight Leg Raises Strengthening;Both;2 sets;10 reps     Knee/Hip Exercises: Sidelying   Hip ABduction Strengthening;Both;2 sets;10 reps   Hip ADduction Strengthening;Both;2 sets;10 reps     Knee/Hip Exercises: Prone   Hip Extension Strengthening;Both;2 sets;10 reps     Moist Heat Therapy   Number Minutes Moist Heat 15 Minutes   Moist Heat Location Lumbar Spine     Electrical Stimulation   Electrical Stimulation Location Bil glutes   Electrical Stimulation Action IFC   Electrical Stimulation Parameters 15 minutes   Electrical Stimulation Goals Pain                PT Education - 12/03/15 1217    Education provided Yes   Education Details SLR 4 ways   Person(s) Educated Patient    Methods Explanation;Demonstration;Handout   Comprehension Verbalized understanding;Returned demonstration          PT Short Term Goals - 12/01/15 1403      PT SHORT TERM GOAL #1   Title independent with initial HEP   Time 4   Period Weeks   Status Achieved     PT SHORT TERM GOAL #2   Title understand correct body mechanics and sleeping positions to reduce strain on back and hips   Time 4   Period Weeks   Status Achieved     PT SHORT TERM GOAL #3   Title pain with daily activities reduced >/= 25%   Time 4   Period Weeks   Status Achieved     PT SHORT TERM GOAL #4   Title pain with sitting and walking decreased >/= 25%   Time 4   Period Weeks   Status Achieved           PT Long Term Goals - 12/03/15 1200      PT LONG TERM GOAL #1   Title independent with HEP    Status Achieved     PT LONG TERM GOAL #2   Title bil. hip strength >/= 4+/5 so she is able to stand or walk for 1 hour in a store   Status Partially Met     PT LONG TERM GOAL #3   Title sit for 2 hour to see a movie with pain decreased >/= 75%     PT LONG TERM GOAL #4   Title drive for 30 min with pain in right leg decreased >/= 75%   Status Achieved     PT LONG TERM GOAL #5   Title FOTO score </= 43% limitation   Status Not Met     PT LONG TERM GOAL #6   Title be able to perform daily home tasks with >/= 75% greater ease due to reduction in pain   Status Achieved     PT LONG TERM GOAL #7   Title shave legs with >/= 75% greater ease due to increase mobility and reduction in pain   Status Partially Met               Plan - 12/03/15 1206    Clinical Impression Statement Pt reports that she is 50% better overall.  Pain is variable with activity.  Pt has HEP in place to address flexiblity and strength.  Pt is able to sit for 2 hours at a movie and has  reduced pain and increased ease with housework and self-care tasks.   PT Next Visit Plan D/C PT to HEP   Consulted and Agree with Plan of  Care Patient      Patient will benefit from skilled therapeutic intervention in order to improve the following deficits and impairments:     Visit Diagnosis: Muscle weakness (generalized)  Pain in right hip  Pain in left hip  Acute midline low back pain without sciatica     Problem List Patient Active Problem List   Diagnosis Date Noted  . Severe recurrent major depression without psychotic features ([place])   . Suicidal ideation   . Chronic cholecystitis with calculus 09/27/2011  . Hx of colon cancer, stage II 08/24/2011  . Hyperlipidemia 08/24/2011  . Anxiety and depression 08/24/2011    PHYSICAL THERAPY DISCHARGE SUMMARY  Visits from Start of Care: 17  Current functional level related to goals / functional outcomes: See above for current status.     Remaining deficits: See above for current status.    Education / Equipment: HEP, Economist education  Plan: Patient agrees to discharge.  Patient goals were partially met. Patient is being discharged due to being pleased with the current functional level.  ?????        Sigurd Sos, PT 12/03/15 12:28 PM  [facility] [address], [place], [place], [address] Phone: [phone]   Fax:  [phone]  Name: Lorraine May MRN: [id number] Date of Birth: [birth year]

## 2015-12-03 NOTE — Patient Instructions (Addendum)
Abdominal bracing: 2x10 each    Hip Flexion / Knee Extension: Straight-Leg Raise (Eccentric)   Lie on back. Lift leg with knee straight. Slowly lower leg for 3-5 seconds. ___ reps per set, ___ sets per day, ___ days per week. Lower like elevator, stopping at each floor. Add ___ lbs when you achieve ___ repetitions. Rest on elbows. Rest on straight arms.  ABDUCTION: Side-Lying (Active)   Lie on left side, top leg straight. Raise top leg as far as possible. Use ___ lbs. Complete ___ sets of ___ repetitions. Perform ___ sessions per day.  http://gtsc.exer.us/94   (Home) Extension: Hip   With support under abdomen, tighten stomach. Lift right leg in line with body. Do not hyperextend. Alternate legs. Repeat ____ times per set. Do ____ sets per session. Do ____ sessions per week.  ADDUCTION: Side-Lying (Active)   Lie on right side, with top leg bent and in front of other leg. Lift straight leg up as high as possible. Use ___ lbs. Complete ___ sets of ___ repetitions. Perform ___ sessions per day.  http://gtsc.exer.us/129   Copyright  VHI. All rights reserved.  [facility] [address] [place], [place] [address] Phone # [phone] Fax [phone]

## 2015-12-08 DIAGNOSIS — F332 Major depressive disorder, recurrent severe without psychotic features: Secondary | ICD-10-CM | POA: Diagnosis not present

## 2015-12-15 ENCOUNTER — Telehealth: Payer: Self-pay | Admitting: Neurology

## 2015-12-15 NOTE — Telephone Encounter (Signed)
Pt called to schedule MRI- she said she will have at [facility]

## 2015-12-16 DIAGNOSIS — F341 Dysthymic disorder: Secondary | ICD-10-CM | POA: Diagnosis not present

## 2015-12-16 DIAGNOSIS — F4310 Post-traumatic stress disorder, unspecified: Secondary | ICD-10-CM | POA: Diagnosis not present

## 2015-12-16 DIAGNOSIS — F411 Generalized anxiety disorder: Secondary | ICD-10-CM | POA: Diagnosis not present

## 2015-12-16 DIAGNOSIS — F606 Avoidant personality disorder: Secondary | ICD-10-CM | POA: Diagnosis not present

## 2015-12-17 NOTE — Telephone Encounter (Signed)
I called [facility] and and they did not approve the MR lumbar spine wo contrast and I spoke to one of [employer]'s nurse's and she needed more clinical information requesting a peer to peer. The phone number is [phone] and the member ID is [id number] and their DOB is [birth year]. Please do in 2 business days.

## 2015-12-18 NOTE — Telephone Encounter (Signed)
Noted, thank you

## 2015-12-18 NOTE — Telephone Encounter (Signed)
[id number] 11/15-12/14 approved!

## 2015-12-18 NOTE — Telephone Encounter (Signed)
Her MRI is scheduled for 12/31/15 at our [place] mobile unit

## 2015-12-23 DIAGNOSIS — F4310 Post-traumatic stress disorder, unspecified: Secondary | ICD-10-CM | POA: Diagnosis not present

## 2015-12-23 DIAGNOSIS — F341 Dysthymic disorder: Secondary | ICD-10-CM | POA: Diagnosis not present

## 2015-12-23 DIAGNOSIS — F411 Generalized anxiety disorder: Secondary | ICD-10-CM | POA: Diagnosis not present

## 2015-12-23 DIAGNOSIS — F606 Avoidant personality disorder: Secondary | ICD-10-CM | POA: Diagnosis not present

## 2015-12-30 DIAGNOSIS — F4310 Post-traumatic stress disorder, unspecified: Secondary | ICD-10-CM | POA: Diagnosis not present

## 2015-12-30 DIAGNOSIS — F606 Avoidant personality disorder: Secondary | ICD-10-CM | POA: Diagnosis not present

## 2015-12-30 DIAGNOSIS — F341 Dysthymic disorder: Secondary | ICD-10-CM | POA: Diagnosis not present

## 2015-12-30 DIAGNOSIS — F411 Generalized anxiety disorder: Secondary | ICD-10-CM | POA: Diagnosis not present

## 2015-12-31 ENCOUNTER — Ambulatory Visit (INDEPENDENT_AMBULATORY_CARE_PROVIDER_SITE_OTHER): Payer: BLUE CROSS/BLUE SHIELD

## 2015-12-31 DIAGNOSIS — M5440 Lumbago with sciatica, unspecified side: Secondary | ICD-10-CM

## 2015-12-31 DIAGNOSIS — G8929 Other chronic pain: Secondary | ICD-10-CM

## 2015-12-31 DIAGNOSIS — R29898 Other symptoms and signs involving the musculoskeletal system: Secondary | ICD-10-CM

## 2016-01-05 DIAGNOSIS — F332 Major depressive disorder, recurrent severe without psychotic features: Secondary | ICD-10-CM | POA: Diagnosis not present

## 2016-01-06 ENCOUNTER — Telehealth: Payer: Self-pay | Admitting: *Deleted

## 2016-01-06 DIAGNOSIS — F606 Avoidant personality disorder: Secondary | ICD-10-CM | POA: Diagnosis not present

## 2016-01-06 DIAGNOSIS — F4310 Post-traumatic stress disorder, unspecified: Secondary | ICD-10-CM | POA: Diagnosis not present

## 2016-01-06 DIAGNOSIS — F411 Generalized anxiety disorder: Secondary | ICD-10-CM | POA: Diagnosis not present

## 2016-01-06 DIAGNOSIS — F341 Dysthymic disorder: Secondary | ICD-10-CM | POA: Diagnosis not present

## 2016-01-06 NOTE — Telephone Encounter (Signed)
-----   Message from Melvenia Beam, MD sent at 01/05/2016  4:24 PM EST ----- This MRI of the lumbar spine without contrast shows moderate multilevel degenerative changes (arthritic changes) which can cause pain in the low back. There does not appear to be any nerve root pinching thanks

## 2016-01-06 NOTE — Telephone Encounter (Signed)
Called and LVM for pt about results per AA,MD note. Gave [facility] phone number if she has further questions/concerns.

## 2016-01-13 DIAGNOSIS — F4310 Post-traumatic stress disorder, unspecified: Secondary | ICD-10-CM | POA: Diagnosis not present

## 2016-01-13 DIAGNOSIS — F341 Dysthymic disorder: Secondary | ICD-10-CM | POA: Diagnosis not present

## 2016-01-13 DIAGNOSIS — F606 Avoidant personality disorder: Secondary | ICD-10-CM | POA: Diagnosis not present

## 2016-01-13 DIAGNOSIS — F411 Generalized anxiety disorder: Secondary | ICD-10-CM | POA: Diagnosis not present

## 2016-01-14 ENCOUNTER — Other Ambulatory Visit: Payer: Self-pay | Admitting: Obstetrics & Gynecology

## 2016-01-14 DIAGNOSIS — Z1231 Encounter for screening mammogram for malignant neoplasm of breast: Secondary | ICD-10-CM

## 2016-01-20 DIAGNOSIS — F341 Dysthymic disorder: Secondary | ICD-10-CM | POA: Diagnosis not present

## 2016-01-20 DIAGNOSIS — F411 Generalized anxiety disorder: Secondary | ICD-10-CM | POA: Diagnosis not present

## 2016-01-20 DIAGNOSIS — F4310 Post-traumatic stress disorder, unspecified: Secondary | ICD-10-CM | POA: Diagnosis not present

## 2016-01-20 DIAGNOSIS — F606 Avoidant personality disorder: Secondary | ICD-10-CM | POA: Diagnosis not present

## 2016-02-03 DIAGNOSIS — F4310 Post-traumatic stress disorder, unspecified: Secondary | ICD-10-CM | POA: Diagnosis not present

## 2016-02-03 DIAGNOSIS — F411 Generalized anxiety disorder: Secondary | ICD-10-CM | POA: Diagnosis not present

## 2016-02-03 DIAGNOSIS — F606 Avoidant personality disorder: Secondary | ICD-10-CM | POA: Diagnosis not present

## 2016-02-03 DIAGNOSIS — F341 Dysthymic disorder: Secondary | ICD-10-CM | POA: Diagnosis not present

## 2016-02-10 DIAGNOSIS — F341 Dysthymic disorder: Secondary | ICD-10-CM | POA: Diagnosis not present

## 2016-02-10 DIAGNOSIS — F411 Generalized anxiety disorder: Secondary | ICD-10-CM | POA: Diagnosis not present

## 2016-02-10 DIAGNOSIS — F606 Avoidant personality disorder: Secondary | ICD-10-CM | POA: Diagnosis not present

## 2016-02-10 DIAGNOSIS — F4310 Post-traumatic stress disorder, unspecified: Secondary | ICD-10-CM | POA: Diagnosis not present

## 2016-02-13 ENCOUNTER — Ambulatory Visit
Admission: RE | Admit: 2016-02-13 | Discharge: 2016-02-13 | Disposition: A | Discharge status: Home / Self Care | Payer: BLUE CROSS/BLUE SHIELD | Source: Ambulatory Visit | Attending: Obstetrics & Gynecology | Admitting: Obstetrics & Gynecology

## 2016-02-13 DIAGNOSIS — Z1231 Encounter for screening mammogram for malignant neoplasm of breast: Secondary | ICD-10-CM | POA: Diagnosis not present

## 2016-02-17 DIAGNOSIS — F332 Major depressive disorder, recurrent severe without psychotic features: Secondary | ICD-10-CM | POA: Diagnosis not present

## 2016-02-24 DIAGNOSIS — L308 Other specified dermatitis: Secondary | ICD-10-CM | POA: Diagnosis not present

## 2016-02-24 DIAGNOSIS — F606 Avoidant personality disorder: Secondary | ICD-10-CM | POA: Diagnosis not present

## 2016-02-24 DIAGNOSIS — F411 Generalized anxiety disorder: Secondary | ICD-10-CM | POA: Diagnosis not present

## 2016-02-24 DIAGNOSIS — L239 Allergic contact dermatitis, unspecified cause: Secondary | ICD-10-CM | POA: Diagnosis not present

## 2016-02-24 DIAGNOSIS — F341 Dysthymic disorder: Secondary | ICD-10-CM | POA: Diagnosis not present

## 2016-02-24 DIAGNOSIS — F4310 Post-traumatic stress disorder, unspecified: Secondary | ICD-10-CM | POA: Diagnosis not present

## 2016-03-01 DIAGNOSIS — R202 Paresthesia of skin: Secondary | ICD-10-CM | POA: Diagnosis not present

## 2016-03-01 DIAGNOSIS — E7800 Pure hypercholesterolemia, unspecified: Secondary | ICD-10-CM | POA: Diagnosis not present

## 2016-03-01 DIAGNOSIS — E559 Vitamin D deficiency, unspecified: Secondary | ICD-10-CM | POA: Diagnosis not present

## 2016-03-01 DIAGNOSIS — N39498 Other specified urinary incontinence: Secondary | ICD-10-CM | POA: Diagnosis not present

## 2016-03-02 DIAGNOSIS — F411 Generalized anxiety disorder: Secondary | ICD-10-CM | POA: Diagnosis not present

## 2016-03-02 DIAGNOSIS — F341 Dysthymic disorder: Secondary | ICD-10-CM | POA: Diagnosis not present

## 2016-03-02 DIAGNOSIS — F4310 Post-traumatic stress disorder, unspecified: Secondary | ICD-10-CM | POA: Diagnosis not present

## 2016-03-02 DIAGNOSIS — F606 Avoidant personality disorder: Secondary | ICD-10-CM | POA: Diagnosis not present

## 2016-03-03 ENCOUNTER — Telehealth: Payer: Self-pay | Admitting: Neurology

## 2016-03-03 ENCOUNTER — Ambulatory Visit (INDEPENDENT_AMBULATORY_CARE_PROVIDER_SITE_OTHER): Payer: BLUE CROSS/BLUE SHIELD | Admitting: Neurology

## 2016-03-03 ENCOUNTER — Encounter: Payer: Self-pay | Admitting: Neurology

## 2016-03-03 VITALS — BP 111/71 | HR 84 | Ht 64.5 in | Wt 174.6 lb

## 2016-03-03 DIAGNOSIS — M7989 Other specified soft tissue disorders: Secondary | ICD-10-CM | POA: Diagnosis not present

## 2016-03-03 DIAGNOSIS — R52 Pain, unspecified: Secondary | ICD-10-CM

## 2016-03-03 DIAGNOSIS — R21 Rash and other nonspecific skin eruption: Secondary | ICD-10-CM

## 2016-03-03 DIAGNOSIS — G629 Polyneuropathy, unspecified: Secondary | ICD-10-CM | POA: Diagnosis not present

## 2016-03-03 MED ORDER — GABAPENTIN 300 MG PO CAPS: 300.0000 mg | ORAL_CAPSULE | Freq: Three times a day (TID) | ORAL | 11 refills | Status: DC | PRN

## 2016-03-03 MED ORDER — PREDNISONE 20 MG PO TABS: 60.0000 mg | ORAL_TABLET | Freq: Every day | ORAL | 1 refills | Status: DC

## 2016-03-03 NOTE — Telephone Encounter (Signed)
Thanks Terrence Dupont . Patient is scheduled for Feb. 1, 2018  At [facility] I have spoke with patient with details. Patient understood.  Venous Doppler.

## 2016-03-03 NOTE — Progress Notes (Signed)
[facility]   Provider: Dr Jaynee Eagles Referring Provider: Melina Schools Primary Care Physician: Marjorie Smolder, MD  CC: tingling in legs, swelling, rash, incontinence   Interval history 03/03/2016: Here for a new problem today burning in the legs. She has new tingling in her feet. She saw a dermatologist for the rash and he gave her some topical steroids. The rash is better. The tingling is still there. She has swelling in her right foot. She has incontinence. Started a few months ago. Thought it was dry skin. Burning and tingling. The whole foot feels hot both of them. No inciting events. No new medications. Nothing new. Feels swollen behind the knees. Not bad at night, not worse at night. Symptoms are worsening, she is noticing it more. Continuous. No weakness. No change in back pain. No cramping, hands not involved. Nothing makes it better or worse, standing too long may make it worse.   Labs tested: spep, ANA, sed rate, TSH, CMP, B12, Vit D, hgba1c, urinalysis  Interval history: MRI of the brain and thoracic spine were unremarkable. A year ago she pushed a box with her foot, pain exploded up her leg, thought she pulled a muscle, she was diagnosed with bursitis, She saw orthopaedics and diagnosed with bursitis again, an injection made her whole leg go numb, She has hip and lower back pain. She has done physical therapy. [facility]. She is stretching and strengthening. She has lower back pain with radiation down both legs. She has continued weakness.   BBC:WUGQB G Vocciis a very nice 61 y.o.femalehere as a referral from Dr. Val Eagle chronic headaches. PMHx major depression, generalized anxiety, headaches, PTSD, Stage 3 kidney disease, Osteopenia, colon caner s/p right hemicolectomy 2006. She has headaches for a year, no inciting events or head trauma but she was trying all kinds "loads" of medications for her mood disorder. Never  had headaches int he past or any history of migraines. She has multiple types of headaches, occasionally will get ice pick in the right temporal area which occurs every several weeks and is brief, no eye watering or nose running but very painful, no time of day preference. She has a headache right now on the top of her head and behind the eyes, she saw an ophthalmologist who sent her stat for temporal arteritis but labwork was negative. 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 The headache is a pressure headache, more pressure and pain, consistent and continuous over 4-5 hours and a few tylenol help, she takes tylenol 3-4 times a week. She had TMS therapy for depression last year and this resulted in headaches. Headaches most days of the week,she wakes up with headaches. She snores mildly. She had an MRI of the brain but may have started after the MRI last April. The clonidine was started about the same time as the headaches. She has no significant daytime somnolence and no other signs of obstructive sleep apnea. No other focal neurologic deficits or complaints. No double vision, no aphasia, no dysarthria, no weakness, no sensory changes. No bowel or bladder changes. No fever or systemic symptoms. She also has a lot of tightness in the neck associated with headaches.  Reviewed notes, labs and imaging from outside physicians, which showed: Patient brings in a CD with the images, personally reviewed. MRI of the cervical spine was largely unremarkable, at C4-C5 minimal central canal and moderate right foraminal stenosis. At C5-C6 minimal central canal and moderate right foraminal  stenosis.  Patient was referred by [facility] orthopedics. She is seeing them for neck pain. Neck Stiffness and headaches. Symptoms exacerbated by turning the head to the right and turning the head to the left. Treated with non-opioid analgesics. Patient also reported low back pain. Mild neck discomfort, worse with range of motion and deep  palpation. Exam significant for 10 beats of clonus in the lower extremities bilaterally, 3+ brisk deep tendon reflexes at the knees, Achilles, biceps, triceps and brachial radialis. Toes are flexor. Negative Hoffman sign. Normal gait pattern. Slight limp when she walks because of right-sided gluteal and lateral hip pain that she is not ataxic and she has no significant imbalance. Negative Romberg. MRI reviewed showed no cord signal changes. Mild multilevel degenerative disease no significant high-grade neural compression. No evidence to suggest cervical spondylitic myelopathy.  BMP unremarkable creatinine slightly elevated 1.02, CBC normal  Review of Systems: Patient complains of symptoms per HPI as well as the following symptoms: Nausea, vomiting, leg swelling, painful urination, incontinence of bladder, frequency of urination, urgency, depression, nervousness. Pertinent negatives per HPI. All others negative.   Social History   Social History  . Marital status: Married    Spouse name: Herbie Baltimore  . Number of children: 1  . Years of education: 62   Occupational History  . Accounting- Retired    Social History Main Topics  . Smoking status: Never Smoker  . Smokeless tobacco: Never Used  . Alcohol use Yes     Comment: occ  . Drug use: No  . Sexual activity: Not on file   Other Topics Concern  . Not on file   Social History Narrative   Lives with husband   Caffeine use: 2-3 cups per day    Family History  Problem Relation Age of Onset  . Diabetes Mother     type 1  . Stroke Mother   . Dementia Mother   . Thyroid disease Mother   . Alcohol abuse Mother   . Asthma Mother   . Alcohol abuse Father   . Diabetes Father   . Cancer Paternal Aunt     germ cell  . Cancer Maternal Grandmother     colon  . Cancer Maternal Uncle     throat  . Thyroid disease Sister   . Alcohol abuse Brother   . Psychiatric Illness Brother   . Psychiatric Illness Sister   . Asthma Sister      Past Medical History:  Diagnosis Date  . Anxiety   . Anxiety and depression 08/24/2011  . Arthritis   . Cancer ([place])    colon  . Depression   . Hx of colon cancer, stage II 08/24/2011   T3N0  adenoca cecum resected 04/2004  Xeloda adjuvant chemo  . Hyperlipidemia 08/24/2011  . Joint pain   . Nausea & vomiting   . PONV (postoperative nausea and vomiting)     Past Surgical History:  Procedure Laterality Date  . ABDOMINAL HYSTERECTOMY  2004  . BREAST SURGERY  2011   reduction  . CHOLECYSTECTOMY  10/05/2011   Procedure: LAPAROSCOPIC CHOLECYSTECTOMY WITH INTRAOPERATIVE CHOLANGIOGRAM;  Surgeon: Stark Klein, MD;  Location: [place];  Service: General;  Laterality: N/A;  . [place]  . HEMICOLECTOMY Right 2006  . HERNIA REPAIR  2007  . KNEE ARTHROSCOPY  2010  . NASAL SEPTUM SURGERY  1983  . OTHER SURGICAL HISTORY  2016   Transcranial magnetic stimulation  . PORT-A-CATH REMOVAL  2006  insertion and removal  . STRABISMUS SURGERY  1962    Current Outpatient Prescriptions  Medication Sig Dispense Refill  . busPIRone (BUSPAR) 15 MG tablet Take 30 mg by mouth 2 (two) times daily.     . Calcium Citrate (CITRACAL PO) Take 2 tablets by mouth daily.    . cholestyramine (QUESTRAN) 4 G packet Take 1 packet by mouth daily.    Marland Kitchen dicyclomine (BENTYL) 20 MG tablet Take 20 mg by mouth as needed for spasms.     . ergocalciferol (VITAMIN D2) 50000 UNITS capsule Take 50,000 Units by mouth once a week. Take on sundays    . ESTRACE VAGINAL 0.1 MG/GM vaginal cream Place 1 Applicatorful vaginally once a week.     Marland Kitchen L-Methylfolate-B12-B6-B2 (CEREFOLIN PO) Take 600 mg by mouth daily.    Marland Kitchen lamoTRIgine (LAMICTAL) 200 MG tablet Take 100 mg by mouth daily. Takes two 100 mg tablets together    . simvastatin (ZOCOR) 40 MG tablet Take 40 mg by mouth every evening.    . triamcinolone ointment (KENALOG) 0.1 % Apply 1 application topically as needed.  0  . valACYclovir (VALTREX) 500 MG tablet  Take 4 tablets by mouth. Take 4 tablets and then another 4 tablets 12 hours later as needed for fever blister  0  . zolpidem (AMBIEN CR) 6.25 MG CR tablet Take 5 mg by mouth at bedtime.   0  . gabapentin (NEURONTIN) 300 MG capsule Take 1 capsule (300 mg total) by mouth 3 (three) times daily as needed. 90 capsule 11  . predniSONE (DELTASONE) 20 MG tablet Take 3 tablets (60 mg total) by mouth daily with breakfast. For 7 days 21 tablet 1   No current facility-administered medications for this visit.     Allergies as of 03/03/2016 - Review Complete 03/03/2016  Allergen Reaction Noted  . Dilaudid [hydromorphone hcl] Itching 08/24/2011  . Fentanyl Itching 08/22/2012  . Morphine and related Itching 08/24/2011  . Percocet [oxycodone-acetaminophen]  08/21/2013  . Tetracyclines & related Nausea Only 08/24/2011  . Sulfa antibiotics Rash 08/24/2011    Vitals: BP 111/71 (BP Location: Right Arm, Patient Position: Sitting, Cuff Size: Normal)   Pulse 84   Ht 5' 4.5" (1.638 m)   Wt 174 lb 9.6 oz (79.2 kg)   BMI 29.51 kg/m  Last Weight:  Wt Readings from Last 1 Encounters:  03/03/16 174 lb 9.6 oz (79.2 kg)   Last Height:   Ht Readings from Last 1 Encounters:  03/03/16 5' 4.5" (1.638 m)    Physical exam: Exam: Gen: NAD, conversant, well nourised, overweight , well groomed  CV: RRR, no MRG. No Carotid Bruits. No peripheral edema, warm, nontender Eyes: Conjunctivae clear without exudates or hemorrhage  Neuro: Detailed Neurologic Exam  Speech: Speech is normal; fluent and spontaneous with normal comprehension.  Cognition: The patient is oriented to person, place, and time;  recent and remote memory intact;  language fluent;  normal attention, concentration,  fund of knowledge Cranial Nerves: The pupils are equal, round, and reactive to light. Attempted funduscopic exam could not visualize. Visual fields are full to finger confrontation.  Extraocular movements are intact. Trigeminal sensation is intact and the muscles of mastication are normal. The face is symmetric. The palate elevates in the midline. Hearing intact. Voice is normal. Shoulder shrug is normal. The tongue has normal motion without fasciculations.   Coordination: Normal finger to nose and heel to shin. Normal rapid alternating movements.   Gait: Heel-toe and tandem gait are normal.  Motor Observation: No asymmetry, no atrophy, and no involuntary movements noted. Tone: Normal muscle tone.   Posture: Posture is normal. normal erect  Strength: Left hip flexion 4+/5. Strength is V/V in the upper and lower limbs.   Sensation: intact to LT, pin prick, vibration and   Reflex Exam:  DTR's: Deep tendon reflexes in the upper and lower extremities are brisk in the uppers with clonus in the lowers bilaterally.  Toes: The toes are downgoing bilaterally.  Clonus: Clonus in the bilat patellars and ankles. .     Assessment/Plan:Very nice 61 y.o. female here as a referral from Dr. Rolena Infante for chronic headaches and low back pain. PMHx major depression, generalized anxiety, headaches, PTSD, Stage 3 kidney disease, Osteopenia, colon caner s/p right hemicolectomy 2006. She has headaches for a year in the setting of medical management for severe refractory depression. One medication that may be the culprit is clonidine which can cause headaches and was started around the same time. Patient also has some interesting physical findings including bilateral lower extremity clonus at the patellars and the ankle jerks but imaging of the brain, cervical and thoracic spine unremarkable for UMN disease will follow clinically.   She is here today for a new problem, neuropathy in the feet with a rash and swelling: will order additional neuropathy labs, emg/ncs, steroids for 7 days, [place] bilateral lowers due to swelling  Orders Placed This  Encounter  Procedures  . US Venous Img Lower Bilateral  . Angiotensin converting enzyme  . Sjogren's syndrome antibods(ssa + ssb)  . Pan-ANCA  . Multiple Myeloma Panel (SPEP&IFE w/QIG)  . Rheumatoid factor  . Heavy metals, blood  . Vitamin B6  . B. burgdorfi Antibody  . C-reactive protein  . Vitamin B1  . NCV with EMG(electromyography)   Stopping the clonidine resolved the headaches MRI of the lumbar spine to evaluate for nerve root impingement and possible epidural steroid injections or surgical intervention  To prevent or relieve headaches, try the following:  Cool Compress.Lie down and place a cool compress on your head.   Avoid headache triggers.If certain foods or odors seem to have triggered your migraines in the past, avoid them. A headache diary might help you identify triggers.   Include physical activity in your daily routine.Try a daily walk or other moderate aerobic exercise.   Manage stress.Find healthy ways to cope with the stressors, such as delegating tasks on your to-do list.   Practice relaxation techniques.Try deep breathing, yoga, massage and visualization.   Eat regularly.Eating regularly scheduled meals and maintaining a healthy diet might help prevent headaches. Also, drink plenty of fluids.   Follow a regular sleep schedule.Sleep deprivation might contribute to headaches  Consider biofeedback.With this mind-body technique, you learn to control certain bodily functions -such as muscle tension, heart rate and blood pressure -to prevent headaches or reduce headache pain.    Proceed to emergency room if you experience new or worsening symptoms or symptoms do not resolve, if you have new neurologic symptoms or if headache is severe, or for any concerning symptom.  Sarina Ill, MD  [facility] [address] [place], [place] [address]  Phone [phone] Fax [phone]  A total of 40 minutes was spent  face-to-face with this patient. Over half this time was spent on counseling patient on the neuropathy and different diagnostic and therapeutic options available.

## 2016-03-03 NOTE — Telephone Encounter (Signed)
Hi Dr. Jaynee Eagles / Terrence Dupont Order needs to be changed. This is order number  [id number]. Marland Kitchen Patient will have done at [facility].  Please Tell me when  done I will call Patient and Zacarias Pontes back. Thanks Hinton Dyer.

## 2016-03-03 NOTE — Patient Instructions (Addendum)
Remember to drink plenty of fluid, eat healthy meals and do not skip any meals. Try to eat protein with a every meal and eat a healthy snack such as fruit or nuts in between meals. Try to keep a regular sleep-wake schedule and try to exercise daily, particularly in the form of walking, 20-30 minutes a day, if you can.   As far as your medications are concerned, I would like to suggest: Steroids 60mg  a day for 7 days. Also Gabapentin as needed.  As far as diagnostic testing: Labs and emg/ncs. May consider a nerve biopsy if needed. [place] bilateral lowers.  I would like to see you back for emg/ncs, sooner if we need to. Please call us with any interim questions, concerns, problems, updates or refill requests.    Our phone number is [phone]. We also have an after hours call service for urgent matters and there is a physician on-call for urgent questions. For any emergencies you know to call 911 or go to the nearest emergency room  Prednisone tablets What is this medicine? PREDNISONE (PRED ni sone) is a corticosteroid. It is commonly used to treat inflammation of the skin, joints, lungs, and other organs. Common conditions treated include asthma, allergies, and arthritis. It is also used for other conditions, such as blood disorders and diseases of the adrenal glands. This medicine may be used for other purposes; ask your health care provider or pharmacist if you have questions. COMMON BRAND NAME(S): Deltasone, Predone, Sterapred, Sterapred DS What should I tell my health care provider before I take this medicine? They need to know if you have any of these conditions: -Cushing's syndrome -diabetes -glaucoma -heart disease -high blood pressure -infection (especially a virus infection such as chickenpox, cold sores, or herpes) -kidney disease -liver disease -mental illness -myasthenia gravis -osteoporosis -seizures -stomach or intestine problems -thyroid disease -an unusual or allergic  reaction to lactose, prednisone, other medicines, foods, dyes, or preservatives -pregnant or trying to get pregnant -breast-feeding How should I use this medicine? Take this medicine by mouth with a glass of water. Follow the directions on the prescription label. Take this medicine with food. If you are taking this medicine once a day, take it in the morning. Do not take more medicine than you are told to take. Do not suddenly stop taking your medicine because you may develop a severe reaction. Your doctor will tell you how much medicine to take. If your doctor wants you to stop the medicine, the dose may be slowly lowered over time to avoid any side effects. Talk to your pediatrician regarding the use of this medicine in children. Special care may be needed. Overdosage: If you think you have taken too much of this medicine contact a poison control center or emergency room at once. NOTE: This medicine is only for you. Do not share this medicine with others. What if I miss a dose? If you miss a dose, take it as soon as you can. If it is almost time for your next dose, talk to your doctor or health care professional. You may need to miss a dose or take an extra dose. Do not take double or extra doses without advice. What may interact with this medicine? Do not take this medicine with any of the following medications: -metyrapone -mifepristone This medicine may also interact with the following medications: -aminoglutethimide -amphotericin B -aspirin and aspirin-like medicines -barbiturates -certain medicines for diabetes, like glipizide or glyburide -cholestyramine -cholinesterase inhibitors -cyclosporine -digoxin -diuretics -ephedrine -female  hormones, like estrogens and birth control pills -isoniazid -ketoconazole -NSAIDS, medicines for pain and inflammation, like ibuprofen or naproxen -phenytoin -rifampin -toxoids -vaccines -warfarin This list may not describe all possible  interactions. Give your health care provider a list of all the medicines, herbs, non-prescription drugs, or dietary supplements you use. Also tell them if you smoke, drink alcohol, or use illegal drugs. Some items may interact with your medicine. What should I watch for while using this medicine? Visit your doctor or health care professional for regular checks on your progress. If you are taking this medicine over a prolonged period, carry an identification card with your name and address, the type and dose of your medicine, and your doctor's name and address. This medicine may increase your risk of getting an infection. Tell your doctor or health care professional if you are around anyone with measles or chickenpox, or if you develop sores or blisters that do not heal properly. If you are going to have surgery, tell your doctor or health care professional that you have taken this medicine within the last twelve months. Ask your doctor or health care professional about your diet. You may need to lower the amount of salt you eat. This medicine may affect blood sugar levels. If you have diabetes, check with your doctor or health care professional before you change your diet or the dose of your diabetic medicine. What side effects may I notice from receiving this medicine? Side effects that you should report to your doctor or health care professional as soon as possible: -allergic reactions like skin rash, itching or hives, swelling of the face, lips, or tongue -changes in emotions or moods -changes in vision -depressed mood -eye pain -fever or chills, cough, sore throat, pain or difficulty passing urine -increased thirst -swelling of ankles, feet Side effects that usually do not require medical attention (report to your doctor or health care professional if they continue or are bothersome): -confusion, excitement, restlessness -headache -nausea, vomiting -skin problems, acne, thin and shiny  skin -trouble sleeping -weight gain This list may not describe all possible side effects. Call your doctor for medical advice about side effects. You may report side effects to FDA at 1-800-FDA-1088. Where should I keep my medicine? Keep out of the reach of children. Store at room temperature between 15 and 30 degrees C (59 and 86 degrees F). Protect from light. Keep container tightly closed. Throw away any unused medicine after the expiration date. NOTE: This sheet is a summary. It may not cover all possible information. If you have questions about this medicine, talk to your doctor, pharmacist, or health care provider.  2017 Elsevier/Gold Standard (2010-09-03 10:57:14)   Gabapentin capsules or tablets What is this medicine? GABAPENTIN (GA ba pen tin) is used to control partial seizures in adults with epilepsy. It is also used to treat certain types of nerve pain. This medicine may be used for other purposes; ask your health care provider or pharmacist if you have questions. COMMON BRAND NAME(S): Active-PAC with Gabapentin, Gabarone, Neurontin What should I tell my health care provider before I take this medicine? They need to know if you have any of these conditions: -kidney disease -suicidal thoughts, plans, or attempt; a previous suicide attempt by you or a family member -an unusual or allergic reaction to gabapentin, other medicines, foods, dyes, or preservatives -pregnant or trying to get pregnant -breast-feeding How should I use this medicine? Take this medicine by mouth with a glass of water. Follow the  directions on the prescription label. You can take it with or without food. If it upsets your stomach, take it with food.Take your medicine at regular intervals. Do not take it more often than directed. Do not stop taking except on your doctor's advice. If you are directed to break the 600 or 800 mg tablets in half as part of your dose, the extra half tablet should be used for the  next dose. If you have not used the extra half tablet within 28 days, it should be thrown away. A special MedGuide will be given to you by the pharmacist with each prescription and refill. Be sure to read this information carefully each time. Talk to your pediatrician regarding the use of this medicine in children. Special care may be needed. Overdosage: If you think you have taken too much of this medicine contact a poison control center or emergency room at once. NOTE: This medicine is only for you. Do not share this medicine with others. What if I miss a dose? If you miss a dose, take it as soon as you can. If it is almost time for your next dose, take only that dose. Do not take double or extra doses. What may interact with this medicine? Do not take this medicine with any of the following medications: -other gabapentin products This medicine may also interact with the following medications: -alcohol -antacids -antihistamines for allergy, cough and cold -certain medicines for anxiety or sleep -certain medicines for depression or psychotic disturbances -homatropine; hydrocodone -naproxen -narcotic medicines (opiates) for pain -phenothiazines like chlorpromazine, mesoridazine, prochlorperazine, thioridazine This list may not describe all possible interactions. Give your health care provider a list of all the medicines, herbs, non-prescription drugs, or dietary supplements you use. Also tell them if you smoke, drink alcohol, or use illegal drugs. Some items may interact with your medicine. What should I watch for while using this medicine? Visit your doctor or health care professional for regular checks on your progress. You may want to keep a record at home of how you feel your condition is responding to treatment. You may want to share this information with your doctor or health care professional at each visit. You should contact your doctor or health care professional if your seizures get  worse or if you have any new types of seizures. Do not stop taking this medicine or any of your seizure medicines unless instructed by your doctor or health care professional. Stopping your medicine suddenly can increase your seizures or their severity. Wear a medical identification bracelet or chain if you are taking this medicine for seizures, and carry a card that lists all your medications. You may get drowsy, dizzy, or have blurred vision. Do not drive, use machinery, or do anything that needs mental alertness until you know how this medicine affects you. To reduce dizzy or fainting spells, do not sit or stand up quickly, especially if you are an older patient. Alcohol can increase drowsiness and dizziness. Avoid alcoholic drinks. Your mouth may get dry. Chewing sugarless gum or sucking hard candy, and drinking plenty of water will help. The use of this medicine may increase the chance of suicidal thoughts or actions. Pay special attention to how you are responding while on this medicine. Any worsening of mood, or thoughts of suicide or dying should be reported to your health care professional right away. Women who become pregnant while using this medicine may enroll in the [employer] Pregnancy Registry by calling [phone].  This registry collects information about the safety of antiepileptic drug use during pregnancy. What side effects may I notice from receiving this medicine? Side effects that you should report to your doctor or health care professional as soon as possible: -allergic reactions like skin rash, itching or hives, swelling of the face, lips, or tongue -worsening of mood, thoughts or actions of suicide or dying Side effects that usually do not require medical attention (report to your doctor or health care professional if they continue or are bothersome): -constipation -difficulty walking or controlling muscle movements -dizziness -nausea -slurred  speech -tiredness -tremors -weight gain This list may not describe all possible side effects. Call your doctor for medical advice about side effects. You may report side effects to FDA at 1-800-FDA-1088. Where should I keep my medicine? Keep out of reach of children. This medicine may cause accidental overdose and death if it taken by other adults, children, or pets. Mix any unused medicine with a substance like cat litter or coffee grounds. Then throw the medicine away in a sealed container like a sealed bag or a coffee can with a lid. Do not use the medicine after the expiration date. Store at room temperature between 15 and 30 degrees C (59 and 86 degrees F). NOTE: This sheet is a summary. It may not cover all possible information. If you have questions about this medicine, talk to your doctor, pharmacist, or health care provider.  2017 Elsevier/Gold Standard (2013-03-16 15:26:50)

## 2016-03-03 NOTE — Telephone Encounter (Signed)
Hinton Dyer- I placed the order. It is under CV proc tab though. Thank you!

## 2016-03-03 NOTE — Telephone Encounter (Signed)
Noted, thank you Dana.  °

## 2016-03-04 ENCOUNTER — Ambulatory Visit (HOSPITAL_COMMUNITY): Payer: BLUE CROSS/BLUE SHIELD

## 2016-03-05 ENCOUNTER — Ambulatory Visit (HOSPITAL_COMMUNITY)
Admission: RE | Admit: 2016-03-05 | Discharge: 2016-03-05 | Disposition: A | Discharge status: Home / Self Care | Payer: BLUE CROSS/BLUE SHIELD | Source: Ambulatory Visit | Attending: Neurology | Admitting: Neurology

## 2016-03-05 DIAGNOSIS — R52 Pain, unspecified: Secondary | ICD-10-CM

## 2016-03-05 DIAGNOSIS — M7989 Other specified soft tissue disorders: Secondary | ICD-10-CM | POA: Diagnosis not present

## 2016-03-05 NOTE — Progress Notes (Addendum)
*  PRELIMINARY RESULTS* Vascular Ultrasound Lower extremity venous duplex has been completed.  Preliminary findings: No evidence of DVT or baker's cyst.   Called results to Dr. Jaynee Eagles.   Landry Mellow, RDMS, RVT  03/05/2016, 10:11 AM

## 2016-03-08 ENCOUNTER — Telehealth: Payer: Self-pay | Admitting: *Deleted

## 2016-03-08 ENCOUNTER — Telehealth: Payer: Self-pay

## 2016-03-08 LAB — MULTIPLE MYELOMA PANEL, SERUM
Albumin SerPl Elph-Mcnc: 4 g/dL (ref 2.9–4.4)
Albumin/Glob SerPl: 1.6 (ref 0.7–1.7)
Alpha 1: 0.2 g/dL (ref 0.0–0.4)
Alpha2 Glob SerPl Elph-Mcnc: 0.7 g/dL (ref 0.4–1.0)
B-Globulin SerPl Elph-Mcnc: 1 g/dL (ref 0.7–1.3)
Gamma Glob SerPl Elph-Mcnc: 0.7 g/dL (ref 0.4–1.8)
Globulin, Total: 2.6 g/dL (ref 2.2–3.9)
IgA/Immunoglobulin A, Serum: 129 mg/dL (ref 87–352)
IgG (Immunoglobin G), Serum: 698 mg/dL — ABNORMAL LOW (ref 700–1600)
IgM (Immunoglobulin M), Srm: 19 mg/dL — ABNORMAL LOW (ref 26–217)
Total Protein: 6.6 g/dL (ref 6.0–8.5)

## 2016-03-08 LAB — ANGIOTENSIN CONVERTING ENZYME: Angio Convert Enzyme: 31 U/L (ref 14–82)

## 2016-03-08 LAB — PAN-ANCA
ANCA Proteinase 3: <3.5 U/mL (ref 0.0–3.5)
Atypical pANCA: <1:20 {titer}
C-ANCA: <1:20 {titer}
Myeloperoxidase Ab: <9 U/mL (ref 0.0–9.0)
P-ANCA: <1:20 {titer}

## 2016-03-08 LAB — VITAMIN B1: Thiamine: 131.8 nmol/L (ref 66.5–200.0)

## 2016-03-08 LAB — HEAVY METALS, BLOOD
Arsenic: 9 ug/L (ref 2–23)
Lead, Blood: NOT DETECTED ug/dL (ref 0–19)
Mercury: NOT DETECTED ug/L (ref 0.0–14.9)

## 2016-03-08 LAB — RHEUMATOID FACTOR: Rhuematoid fact SerPl-aCnc: <10 IU/mL (ref 0.0–13.9)

## 2016-03-08 LAB — VITAMIN B6: Vitamin B6: 8.8 ug/L (ref 2.0–32.8)

## 2016-03-08 LAB — B. BURGDORFI ANTIBODIES: Lyme IgG/IgM Ab: <0.91 {ISR} (ref 0.00–0.90)

## 2016-03-08 LAB — SJOGREN'S SYNDROME ANTIBODS(SSA + SSB)
ENA SSA (RO) Ab: <0.2 AI (ref 0.0–0.9)
ENA SSB (LA) Ab: <0.2 AI (ref 0.0–0.9)

## 2016-03-08 LAB — C-REACTIVE PROTEIN: CRP: 0.5 mg/L (ref 0.0–4.9)

## 2016-03-08 NOTE — Telephone Encounter (Signed)
-----   Message from Melvenia Beam, MD sent at 03/08/2016  2:07 PM EST ----- Labs all normal thanks

## 2016-03-08 NOTE — Telephone Encounter (Signed)
LVM for pt about results per AA,MD note. Gave [facility] phone number if she has further questions or concerns.

## 2016-03-08 NOTE — Telephone Encounter (Signed)
Called pt w/ unremarkable lab results. Verbalized understanding and appreciation for call. 

## 2016-03-08 NOTE — Telephone Encounter (Signed)
-----   Message from Melvenia Beam, MD sent at 03/06/2016 11:37 AM EST ----- No evidence of deep vein thrombosis involving the right lower extremity and left lower extremity, no evidence of Baker&'s cyst on the right or left.

## 2016-03-09 DIAGNOSIS — F4310 Post-traumatic stress disorder, unspecified: Secondary | ICD-10-CM | POA: Diagnosis not present

## 2016-03-09 DIAGNOSIS — F411 Generalized anxiety disorder: Secondary | ICD-10-CM | POA: Diagnosis not present

## 2016-03-09 DIAGNOSIS — F606 Avoidant personality disorder: Secondary | ICD-10-CM | POA: Diagnosis not present

## 2016-03-09 DIAGNOSIS — F341 Dysthymic disorder: Secondary | ICD-10-CM | POA: Diagnosis not present

## 2016-03-12 ENCOUNTER — Telehealth: Payer: Self-pay | Admitting: Diagnostic Neuroimaging

## 2016-03-12 NOTE — Telephone Encounter (Signed)
Patient called concerned about increasing swelling in feet and legs. Patient had bilateral lower extremity ultrasound which was negative for DVT on 03/05/16. Low back pain has been improving. Swelling in feet and legs could be related to patient's history of chronic kidney disease, decreased activity, medication side effect. Advised patient to monitor symptoms and if she is concerned she could go to urgent care or emergency room for evaluation this weekend. Otherwise I will send note to Dr. Jaynee Eagles for review next week. -VRP

## 2016-03-12 NOTE — Telephone Encounter (Signed)
Lorraine May, patient really needs to follow up with her primary care about the swelling please let her know thanks

## 2016-03-15 NOTE — Telephone Encounter (Signed)
Called pt and she does have appt scheduled w/ PCP the day after tomorrow for evaluation of leg swelling. Plans on returning to [place] next month for EMG/NCV.

## 2016-03-16 ENCOUNTER — Other Ambulatory Visit: Payer: Self-pay

## 2016-03-16 DIAGNOSIS — M79604 Pain in right leg: Secondary | ICD-10-CM

## 2016-03-16 DIAGNOSIS — F4310 Post-traumatic stress disorder, unspecified: Secondary | ICD-10-CM | POA: Diagnosis not present

## 2016-03-16 DIAGNOSIS — F606 Avoidant personality disorder: Secondary | ICD-10-CM | POA: Diagnosis not present

## 2016-03-16 DIAGNOSIS — F341 Dysthymic disorder: Secondary | ICD-10-CM | POA: Diagnosis not present

## 2016-03-16 DIAGNOSIS — F411 Generalized anxiety disorder: Secondary | ICD-10-CM | POA: Diagnosis not present

## 2016-03-16 DIAGNOSIS — M79605 Pain in left leg: Secondary | ICD-10-CM

## 2016-03-16 DIAGNOSIS — M7989 Other specified soft tissue disorders: Secondary | ICD-10-CM

## 2016-03-18 DIAGNOSIS — N390 Urinary tract infection, site not specified: Secondary | ICD-10-CM | POA: Diagnosis not present

## 2016-03-18 DIAGNOSIS — R609 Edema, unspecified: Secondary | ICD-10-CM | POA: Diagnosis not present

## 2016-03-18 NOTE — Progress Notes (Signed)
Noted this is correct re- order. Thanks Hinton Dyer.

## 2016-03-23 DIAGNOSIS — F606 Avoidant personality disorder: Secondary | ICD-10-CM | POA: Diagnosis not present

## 2016-03-23 DIAGNOSIS — F341 Dysthymic disorder: Secondary | ICD-10-CM | POA: Diagnosis not present

## 2016-03-23 DIAGNOSIS — F4310 Post-traumatic stress disorder, unspecified: Secondary | ICD-10-CM | POA: Diagnosis not present

## 2016-03-23 DIAGNOSIS — F411 Generalized anxiety disorder: Secondary | ICD-10-CM | POA: Diagnosis not present

## 2016-03-24 ENCOUNTER — Telehealth: Payer: Self-pay | Admitting: Neurology

## 2016-03-24 NOTE — Telephone Encounter (Signed)
Patient has been taking gabapentin (NEURONTIN) 300 MG capsule but it is way too strong. Can a lower dose be called to [facility] on [address]. Please call patient and advise.

## 2016-03-25 MED ORDER — GABAPENTIN 300 MG PO CAPS: 300.0000 mg | ORAL_CAPSULE | Freq: Three times a day (TID) | ORAL | 3 refills | Status: DC

## 2016-03-25 MED ORDER — GABAPENTIN 100 MG PO CAPS: 300.0000 mg | ORAL_CAPSULE | Freq: Three times a day (TID) | ORAL | 3 refills | Status: DC

## 2016-03-25 NOTE — Telephone Encounter (Signed)
Yes please prescribe 100mg  tid disp 270 with 4 refills thank yoU!

## 2016-03-25 NOTE — Addendum Note (Signed)
Addended by: Monte Fantasia on: 03/25/2016 06:13 PM   Modules accepted: Orders

## 2016-03-25 NOTE — Telephone Encounter (Signed)
New rx for lower dose of gabapentin e-scribed to pt's pharmacy as requested.

## 2016-03-30 DIAGNOSIS — F341 Dysthymic disorder: Secondary | ICD-10-CM | POA: Diagnosis not present

## 2016-03-30 DIAGNOSIS — F411 Generalized anxiety disorder: Secondary | ICD-10-CM | POA: Diagnosis not present

## 2016-03-30 DIAGNOSIS — F4310 Post-traumatic stress disorder, unspecified: Secondary | ICD-10-CM | POA: Diagnosis not present

## 2016-03-30 DIAGNOSIS — F606 Avoidant personality disorder: Secondary | ICD-10-CM | POA: Diagnosis not present

## 2016-04-06 DIAGNOSIS — F4310 Post-traumatic stress disorder, unspecified: Secondary | ICD-10-CM | POA: Diagnosis not present

## 2016-04-06 DIAGNOSIS — F341 Dysthymic disorder: Secondary | ICD-10-CM | POA: Diagnosis not present

## 2016-04-06 DIAGNOSIS — F606 Avoidant personality disorder: Secondary | ICD-10-CM | POA: Diagnosis not present

## 2016-04-06 DIAGNOSIS — F411 Generalized anxiety disorder: Secondary | ICD-10-CM | POA: Diagnosis not present

## 2016-04-13 DIAGNOSIS — F606 Avoidant personality disorder: Secondary | ICD-10-CM | POA: Diagnosis not present

## 2016-04-13 DIAGNOSIS — F411 Generalized anxiety disorder: Secondary | ICD-10-CM | POA: Diagnosis not present

## 2016-04-13 DIAGNOSIS — F4310 Post-traumatic stress disorder, unspecified: Secondary | ICD-10-CM | POA: Diagnosis not present

## 2016-04-13 DIAGNOSIS — F341 Dysthymic disorder: Secondary | ICD-10-CM | POA: Diagnosis not present

## 2016-04-15 ENCOUNTER — Ambulatory Visit (INDEPENDENT_AMBULATORY_CARE_PROVIDER_SITE_OTHER): Payer: Self-pay | Admitting: Neurology

## 2016-04-15 ENCOUNTER — Ambulatory Visit (INDEPENDENT_AMBULATORY_CARE_PROVIDER_SITE_OTHER): Payer: BLUE CROSS/BLUE SHIELD | Admitting: Neurology

## 2016-04-15 DIAGNOSIS — R202 Paresthesia of skin: Secondary | ICD-10-CM

## 2016-04-15 DIAGNOSIS — G629 Polyneuropathy, unspecified: Secondary | ICD-10-CM

## 2016-04-15 DIAGNOSIS — Z0289 Encounter for other administrative examinations: Secondary | ICD-10-CM

## 2016-04-15 NOTE — Progress Notes (Signed)
See procedure note.

## 2016-04-15 NOTE — Progress Notes (Addendum)
Full Name: Lorraine May Gender: Female MRN #: [id number] Date of Birth: March 14, 1955    Visit Date: 04/15/2016 10:03 Age: 61 Years 8 Months Old Examining Physician: Sarina Ill, MD  Referring Physician: Jaynee Eagles  History: Burning and tingling bilaterally in the legs.    Summary:   The right Superficial Peroneal Sensory nerve was technically normal however there is a relative amplitude decrease on the right suggesting a mild dysfunction of this sensory nerve on the right. The right Peroneal Motor nerve showed delayed latency (7.21ms, N<6.5) and reduced amplitude (1.74mV, N>2) The right Peroneal F wave showed delayed latency (59.71ms, N<56)   All remaining nerves (as indicated in the following tables) were within normal limits.   All muscles (as indicated in the following table) were normal.  Conclusion: The prolonged right Peroneal motor and F wave latencies and reduced amplitudes of the right peroneal motor and sensory nerves suggest a non-localizing peroneal neuropathy.  However this is not the cause of patient's bilateral lower extremity sensory changes. Given patient's clinical history and symptoms, cannot rule out small-fiber neuropathy.   Sarina Ill, M.D.  [facility] [place], [place] [address] Tel: [phone] Fax: [phone]        [facility]    Nerve / Sites Rec. Site Peak Lat Ref.  Amp Ref. Segments Distance    ms ms V V  cm  L Sural - Ankle (Calf)     Calf Ankle 4.0 ?4.4 7 ?6 Calf - Ankle 14  R Sural - Ankle (Calf)     Calf Ankle 3.9 ?4.4 7 ?6 Calf - Ankle 14  L Superficial peroneal - Ankle     Lat leg Ankle 3.8 ?4.4 12 ?6 Lat leg - Ankle 14  R Superficial peroneal - Ankle     Lat leg Ankle 4.1 ?4.4 9 ?6 Lat leg - Ankle 14     [facility]    Nerve / Sites Muscle Latency Ref. Amplitude Ref. Rel Amp Segments Distance Velocity Ref. Area    ms ms mV mV %  cm m/s m/s mVms  L Peroneal - EDB     Ankle EDB 6.4 ?6.5 3.1 ?2.0 100 Ankle - EDB 9    11.1     Fib head EDB 12.5  3.1  102 Fib head - Ankle 27 44 ?44 13.1     Pop fossa EDB 14.7  3.0  95.8 Pop fossa - Fib head 10 46 ?44 12.7         Pop fossa - Ankle      R Peroneal - EDB     Ankle EDB 7.4 ?6.5 1.3 ?2.0 100 Ankle - EDB 9   2.8     Fib head EDB 14.2  1.4  113 Fib head - Ankle 24 35 ?44 2.9     Pop fossa EDB 16.7  1.5  105 Pop fossa - Fib head 10 40 ?44 3.6     Acc Peron EDB 9.2  0.6  42.8 Pop fossa - Ankle    1.1         Acc Peron - Pop fossa      L Tibial - AH     Ankle AH 4.1 ?5.8 14.7 ?4.0 100 Ankle - AH 9   43.1     Pop fossa AH 12.8  13.0  88.8 Pop fossa - Ankle 38 44 ?41 42.4  R Tibial - AH     Ankle AH 4.1 ?5.8 9.2 ?  4.0 100 Ankle - AH 9   35.1     Pop fossa AH 12.9  6.3  68.6 Pop fossa - Ankle 38 43 ?41 27.8     F  Wave    Nerve F Lat Ref.   ms ms  L Tibial - AH 52.3 ?56.0  R Peroneal - EDB 59.5 ?56.0  L Peroneal - EDB 50.7 ?56.0  R Tibial - AH 52.7 ?56.0     EMG full       EMG Summary Table    Spontaneous MUAP Recruitment  Muscle IA Fib PSW Fasc Other Amp Dur. Poly Pattern  R. Vastus medialis Normal None None None _______ Normal Normal Normal Normal  R. Tibialis anterior Normal None None None _______ Normal Normal Normal Normal  R. Gastrocnemius (Medial head) Normal None None None _______ Normal Normal Normal Normal  R. Extensor hallucis longus Normal None None None _______ Normal Normal Normal Normal  R. Abductor hallucis Normal None None None _______ Normal Normal Normal Normal  R. Biceps femoris (long head) Normal None None None _______ Normal Normal Normal Normal  R. Gluteus medius Normal None None None _______ Normal Normal Normal Normal  R. Gluteus maximus Normal None None None _______ Normal Normal Normal Normal  R. Lumbar paraspinals (low) Normal None None None _______ Normal Normal Normal Normal

## 2016-04-20 DIAGNOSIS — F341 Dysthymic disorder: Secondary | ICD-10-CM | POA: Diagnosis not present

## 2016-04-20 DIAGNOSIS — F411 Generalized anxiety disorder: Secondary | ICD-10-CM | POA: Diagnosis not present

## 2016-04-20 DIAGNOSIS — F4310 Post-traumatic stress disorder, unspecified: Secondary | ICD-10-CM | POA: Diagnosis not present

## 2016-04-20 DIAGNOSIS — F606 Avoidant personality disorder: Secondary | ICD-10-CM | POA: Diagnosis not present

## 2016-04-21 DIAGNOSIS — Z5181 Encounter for therapeutic drug level monitoring: Secondary | ICD-10-CM | POA: Diagnosis not present

## 2016-04-21 DIAGNOSIS — E538 Deficiency of other specified B group vitamins: Secondary | ICD-10-CM | POA: Diagnosis not present

## 2016-04-21 DIAGNOSIS — R208 Other disturbances of skin sensation: Secondary | ICD-10-CM | POA: Diagnosis not present

## 2016-04-21 DIAGNOSIS — E559 Vitamin D deficiency, unspecified: Secondary | ICD-10-CM | POA: Diagnosis not present

## 2016-04-21 DIAGNOSIS — Z85038 Personal history of other malignant neoplasm of large intestine: Secondary | ICD-10-CM | POA: Diagnosis not present

## 2016-04-21 DIAGNOSIS — Z0000 Encounter for general adult medical examination without abnormal findings: Secondary | ICD-10-CM | POA: Diagnosis not present

## 2016-04-21 DIAGNOSIS — E7800 Pure hypercholesterolemia, unspecified: Secondary | ICD-10-CM | POA: Diagnosis not present

## 2016-04-21 DIAGNOSIS — L309 Dermatitis, unspecified: Secondary | ICD-10-CM | POA: Diagnosis not present

## 2016-04-21 NOTE — Procedures (Signed)
Full Name: Lorraine May Gender: Female MRN #: [id number] Date of Birth: 05/05/1955    Visit Date: 04/15/2016 10:03 Age: 61 Years 24 Months Old Examining Physician: Sarina Ill, MD  Referring Physician: Jaynee Eagles  History: Burning and tingling bilaterally in the legs.    Summary:   The right Superficial Peroneal Sensory nerve was technically normal however there is a relative amplitude decrease on the right suggesting a mild dysfunction of this sensory nerve on the right. The right Peroneal Motor nerve showed delayed latency (7.57ms, N<6.5) and reduced amplitude (1.7mV, N>2) The right Peroneal F wave showed delayed latency (59.81ms, N<56)   All remaining nerves (as indicated in the following tables) were within normal limits.   All muscles (as indicated in the following table) were normal.  Conclusion: The prolonged right Peroneal motor and F wave latencies and reduced amplitudes of the right peroneal motor and sensory nerves suggest a non-localizing peroneal neuropathy.  However this is not the cause of patient's bilateral lower extremity sensory changes. Given patient's clinical history and symptoms, cannot rule out small-fiber neuropathy.   Sarina Ill, M.D.  [facility] [place], [place] [address] Tel: [phone] Fax: [phone]        [facility]    Nerve / Sites Rec. Site Peak Lat Ref.  Amp Ref. Segments Distance    ms ms V V  cm  L Sural - Ankle (Calf)     Calf Ankle 4.0 ?4.4 7 ?6 Calf - Ankle 14  R Sural - Ankle (Calf)     Calf Ankle 3.9 ?4.4 7 ?6 Calf - Ankle 14  L Superficial peroneal - Ankle     Lat leg Ankle 3.8 ?4.4 12 ?6 Lat leg - Ankle 14  R Superficial peroneal - Ankle     Lat leg Ankle 4.1 ?4.4 9 ?6 Lat leg - Ankle 14     [facility]    Nerve / Sites Muscle Latency Ref. Amplitude Ref. Rel Amp Segments Distance Velocity Ref. Area    ms ms mV mV %  cm m/s m/s mVms  L Peroneal - EDB     Ankle EDB 6.4 ?6.5 3.1 ?2.0 100 Ankle - EDB 9    11.1     Fib head EDB 12.5  3.1  102 Fib head - Ankle 27 44 ?44 13.1     Pop fossa EDB 14.7  3.0  95.8 Pop fossa - Fib head 10 46 ?44 12.7         Pop fossa - Ankle      R Peroneal - EDB     Ankle EDB 7.4 ?6.5 1.3 ?2.0 100 Ankle - EDB 9   2.8     Fib head EDB 14.2  1.4  113 Fib head - Ankle 24 35 ?44 2.9     Pop fossa EDB 16.7  1.5  105 Pop fossa - Fib head 10 40 ?44 3.6     Acc Peron EDB 9.2  0.6  42.8 Pop fossa - Ankle    1.1         Acc Peron - Pop fossa      L Tibial - AH     Ankle AH 4.1 ?5.8 14.7 ?4.0 100 Ankle - AH 9   43.1     Pop fossa AH 12.8  13.0  88.8 Pop fossa - Ankle 38 44 ?41 42.4  R Tibial - AH     Ankle AH 4.1 ?5.8 9.2 ?  4.0 100 Ankle - AH 9   35.1     Pop fossa AH 12.9  6.3  68.6 Pop fossa - Ankle 38 43 ?41 27.8     F  Wave    Nerve F Lat Ref.   ms ms  L Tibial - AH 52.3 ?56.0  R Peroneal - EDB 59.5 ?56.0  L Peroneal - EDB 50.7 ?56.0  R Tibial - AH 52.7 ?56.0     EMG full       EMG Summary Table    Spontaneous MUAP Recruitment  Muscle IA Fib PSW Fasc Other Amp Dur. Poly Pattern  R. Vastus medialis Normal None None None _______ Normal Normal Normal Normal  R. Tibialis anterior Normal None None None _______ Normal Normal Normal Normal  R. Gastrocnemius (Medial head) Normal None None None _______ Normal Normal Normal Normal  R. Extensor hallucis longus Normal None None None _______ Normal Normal Normal Normal  R. Abductor hallucis Normal None None None _______ Normal Normal Normal Normal  R. Biceps femoris (long head) Normal None None None _______ Normal Normal Normal Normal  R. Gluteus medius Normal None None None _______ Normal Normal Normal Normal  R. Gluteus maximus Normal None None None _______ Normal Normal Normal Normal  R. Lumbar paraspinals (low) Normal None None None _______ Normal Normal Normal Normal

## 2016-04-27 DIAGNOSIS — F332 Major depressive disorder, recurrent severe without psychotic features: Secondary | ICD-10-CM | POA: Diagnosis not present

## 2016-04-27 DIAGNOSIS — F4310 Post-traumatic stress disorder, unspecified: Secondary | ICD-10-CM | POA: Diagnosis not present

## 2016-04-27 DIAGNOSIS — F606 Avoidant personality disorder: Secondary | ICD-10-CM | POA: Diagnosis not present

## 2016-04-27 DIAGNOSIS — F341 Dysthymic disorder: Secondary | ICD-10-CM | POA: Diagnosis not present

## 2016-04-27 DIAGNOSIS — F411 Generalized anxiety disorder: Secondary | ICD-10-CM | POA: Diagnosis not present

## 2016-04-29 ENCOUNTER — Telehealth: Payer: Self-pay | Admitting: Neurology

## 2016-04-29 NOTE — Telephone Encounter (Addendum)
Left message of both home and mobile numbers to return my call.  If patient would like to proceed, Dr. Krista Blue is agreeable to complete the skin biopsy.  Pt can be schedule in next available appt slot.

## 2016-04-29 NOTE — Telephone Encounter (Signed)
Sharyn Lull, would Dr Krista Blue do a small-fiber biopsy on this patient? Please let me know thatnks

## 2016-05-04 DIAGNOSIS — F606 Avoidant personality disorder: Secondary | ICD-10-CM | POA: Diagnosis not present

## 2016-05-04 DIAGNOSIS — F411 Generalized anxiety disorder: Secondary | ICD-10-CM | POA: Diagnosis not present

## 2016-05-10 DIAGNOSIS — Z85038 Personal history of other malignant neoplasm of large intestine: Secondary | ICD-10-CM | POA: Diagnosis not present

## 2016-05-10 DIAGNOSIS — K915 Postcholecystectomy syndrome: Secondary | ICD-10-CM | POA: Diagnosis not present

## 2016-05-10 DIAGNOSIS — K580 Irritable bowel syndrome with diarrhea: Secondary | ICD-10-CM | POA: Diagnosis not present

## 2016-05-11 DIAGNOSIS — F411 Generalized anxiety disorder: Secondary | ICD-10-CM | POA: Diagnosis not present

## 2016-05-11 DIAGNOSIS — F606 Avoidant personality disorder: Secondary | ICD-10-CM | POA: Diagnosis not present

## 2016-05-11 DIAGNOSIS — F4310 Post-traumatic stress disorder, unspecified: Secondary | ICD-10-CM | POA: Diagnosis not present

## 2016-05-11 DIAGNOSIS — F341 Dysthymic disorder: Secondary | ICD-10-CM | POA: Diagnosis not present

## 2016-05-12 ENCOUNTER — Encounter: Payer: Self-pay | Admitting: Neurology

## 2016-05-12 ENCOUNTER — Ambulatory Visit (INDEPENDENT_AMBULATORY_CARE_PROVIDER_SITE_OTHER): Payer: BLUE CROSS/BLUE SHIELD | Admitting: Neurology

## 2016-05-12 DIAGNOSIS — R202 Paresthesia of skin: Secondary | ICD-10-CM

## 2016-05-12 DIAGNOSIS — G603 Idiopathic progressive neuropathy: Secondary | ICD-10-CM | POA: Diagnosis not present

## 2016-05-12 NOTE — Progress Notes (Signed)
61 years old female referred by neurologist Dr. Jaynee Eagles for skin biopsy to evaluate burning and tingling of bilateral lower extremity.  EMG nerve conduction study on April 15 2016 showed no evidence of large fiber peripheral neuropathy.  Patient was in right lateral recombinant position. Sterile technique. 1% lidocaine with epinephrine was used for local anesthesia. Punctuated skin biopsy was performed. 3 mm skin sample were obtained at at right foot, above right extensor digitorum brevis, and right lateral calf, 10 cm above lateral malleolus, lateral thigh, 20 cm below superior iliac spine.  Patient tolerated the procedure well.  The wound was covered with neosporin antibiotic cream and bandage.

## 2016-05-12 NOTE — Progress Notes (Signed)
**  Xylocaine (lidocaine with epinephrine) 1%, [place] 13685-992-34, Lot [id number], exp 06/2017, office supply.//mck,rn**

## 2016-05-18 DIAGNOSIS — F4310 Post-traumatic stress disorder, unspecified: Secondary | ICD-10-CM | POA: Diagnosis not present

## 2016-05-18 DIAGNOSIS — F341 Dysthymic disorder: Secondary | ICD-10-CM | POA: Diagnosis not present

## 2016-05-18 DIAGNOSIS — F411 Generalized anxiety disorder: Secondary | ICD-10-CM | POA: Diagnosis not present

## 2016-05-18 DIAGNOSIS — F606 Avoidant personality disorder: Secondary | ICD-10-CM | POA: Diagnosis not present

## 2016-05-25 DIAGNOSIS — F606 Avoidant personality disorder: Secondary | ICD-10-CM | POA: Diagnosis not present

## 2016-05-25 DIAGNOSIS — F4310 Post-traumatic stress disorder, unspecified: Secondary | ICD-10-CM | POA: Diagnosis not present

## 2016-05-25 DIAGNOSIS — F341 Dysthymic disorder: Secondary | ICD-10-CM | POA: Diagnosis not present

## 2016-05-25 DIAGNOSIS — F411 Generalized anxiety disorder: Secondary | ICD-10-CM | POA: Diagnosis not present

## 2016-06-01 DIAGNOSIS — F341 Dysthymic disorder: Secondary | ICD-10-CM | POA: Diagnosis not present

## 2016-06-01 DIAGNOSIS — F4310 Post-traumatic stress disorder, unspecified: Secondary | ICD-10-CM | POA: Diagnosis not present

## 2016-06-01 DIAGNOSIS — F606 Avoidant personality disorder: Secondary | ICD-10-CM | POA: Diagnosis not present

## 2016-06-01 DIAGNOSIS — F411 Generalized anxiety disorder: Secondary | ICD-10-CM | POA: Diagnosis not present

## 2016-06-03 ENCOUNTER — Encounter: Payer: Self-pay | Admitting: Neurology

## 2016-06-07 NOTE — Telephone Encounter (Signed)
Pt calling because she has not heard anything re: the results of the skin biopsy done on 05-12-16, asking to be called back with results when they become avail.

## 2016-06-07 NOTE — Telephone Encounter (Addendum)
Received faxed skin biopsy pathology report from [facility] T # [phone] w/ the following diagnosis:  A. R thigh - normal epidermal nerve fiber density B. R calf - significantly reduced epidermal nerve fiber density, consistent with small fiber neuropathy C. R foot - significantly reduced epidermal nerve fiber density, consistent with small fiber neuropathy  Pt has gabapentin prescribed 300 mg TID. She has follow-up appt scheduled in Nov.   Sent to med records for scanning, copy to Dr. Jaynee Eagles for review.

## 2016-06-08 DIAGNOSIS — F411 Generalized anxiety disorder: Secondary | ICD-10-CM | POA: Diagnosis not present

## 2016-06-08 DIAGNOSIS — F606 Avoidant personality disorder: Secondary | ICD-10-CM | POA: Diagnosis not present

## 2016-06-08 DIAGNOSIS — F341 Dysthymic disorder: Secondary | ICD-10-CM | POA: Diagnosis not present

## 2016-06-08 DIAGNOSIS — F4310 Post-traumatic stress disorder, unspecified: Secondary | ICD-10-CM | POA: Diagnosis not present

## 2016-06-08 NOTE — Telephone Encounter (Signed)
Please share that the results confirm small-fiber neuropathy as we discussed. If she would like prior follow up happy to see her and discuss further. Thanks

## 2016-06-10 DIAGNOSIS — F606 Avoidant personality disorder: Secondary | ICD-10-CM | POA: Diagnosis not present

## 2016-06-10 DIAGNOSIS — F341 Dysthymic disorder: Secondary | ICD-10-CM | POA: Diagnosis not present

## 2016-06-10 DIAGNOSIS — F4310 Post-traumatic stress disorder, unspecified: Secondary | ICD-10-CM | POA: Diagnosis not present

## 2016-06-10 DIAGNOSIS — F411 Generalized anxiety disorder: Secondary | ICD-10-CM | POA: Diagnosis not present

## 2016-06-12 ENCOUNTER — Encounter: Payer: Self-pay | Admitting: Neurology

## 2016-06-14 DIAGNOSIS — G620 Drug-induced polyneuropathy: Secondary | ICD-10-CM | POA: Diagnosis not present

## 2016-06-14 DIAGNOSIS — T451X5A Adverse effect of antineoplastic and immunosuppressive drugs, initial encounter: Secondary | ICD-10-CM | POA: Diagnosis not present

## 2016-06-15 DIAGNOSIS — F606 Avoidant personality disorder: Secondary | ICD-10-CM | POA: Diagnosis not present

## 2016-06-15 DIAGNOSIS — F411 Generalized anxiety disorder: Secondary | ICD-10-CM | POA: Diagnosis not present

## 2016-06-15 DIAGNOSIS — F4310 Post-traumatic stress disorder, unspecified: Secondary | ICD-10-CM | POA: Diagnosis not present

## 2016-06-15 DIAGNOSIS — F341 Dysthymic disorder: Secondary | ICD-10-CM | POA: Diagnosis not present

## 2016-06-17 DIAGNOSIS — G629 Polyneuropathy, unspecified: Secondary | ICD-10-CM | POA: Diagnosis not present

## 2016-06-23 ENCOUNTER — Telehealth: Payer: Self-pay

## 2016-06-23 NOTE — Telephone Encounter (Signed)
Received 06/14/16 OV notes from [facility] neuromuscular eval. Dr. Daiva Huge suspects "her neuropathy is likely from previous chemotherapy use. Vitamin deficiency from previous colon resection is possible but recent vitamin B levels were unremarkable. Uremia from CKD could also cause neuropathy. She can increase her gabapentin to 300 mg TID if tolerated...also take alpha lipoic acid 600 mg daily...follow-up with her local neurologist." Sent to med records for scanning, copy to Dr. Jaynee Eagles for review.

## 2016-06-29 DIAGNOSIS — F411 Generalized anxiety disorder: Secondary | ICD-10-CM | POA: Diagnosis not present

## 2016-06-29 DIAGNOSIS — F606 Avoidant personality disorder: Secondary | ICD-10-CM | POA: Diagnosis not present

## 2016-06-29 DIAGNOSIS — F341 Dysthymic disorder: Secondary | ICD-10-CM | POA: Diagnosis not present

## 2016-06-29 DIAGNOSIS — F4310 Post-traumatic stress disorder, unspecified: Secondary | ICD-10-CM | POA: Diagnosis not present

## 2016-07-01 DIAGNOSIS — F332 Major depressive disorder, recurrent severe without psychotic features: Secondary | ICD-10-CM | POA: Diagnosis not present

## 2016-07-02 ENCOUNTER — Ambulatory Visit (INDEPENDENT_AMBULATORY_CARE_PROVIDER_SITE_OTHER): Payer: BLUE CROSS/BLUE SHIELD | Admitting: Neurology

## 2016-07-02 ENCOUNTER — Encounter: Payer: Self-pay | Admitting: Neurology

## 2016-07-02 VITALS — BP 133/81 | HR 74 | Ht 64.6 in | Wt 177.8 lb

## 2016-07-02 DIAGNOSIS — E538 Deficiency of other specified B group vitamins: Secondary | ICD-10-CM

## 2016-07-02 DIAGNOSIS — R7309 Other abnormal glucose: Secondary | ICD-10-CM | POA: Diagnosis not present

## 2016-07-02 DIAGNOSIS — G629 Polyneuropathy, unspecified: Secondary | ICD-10-CM | POA: Diagnosis not present

## 2016-07-02 MED ORDER — L-METHYLFOLATE-B6-B12 3-35-2 MG PO TABS: 1.0000 | ORAL_TABLET | Freq: Two times a day (BID) | ORAL | 12 refills | Status: DC

## 2016-07-02 MED ORDER — GABAPENTIN 400 MG PO CAPS: 400.0000 mg | ORAL_CAPSULE | Freq: Three times a day (TID) | ORAL | 11 refills | Status: DC

## 2016-07-02 NOTE — Patient Instructions (Addendum)
Remember to drink plenty of fluid, eat healthy meals and do not skip any meals. Try to eat protein with a every meal and eat a healthy snack such as fruit or nuts in between meals. Try to keep a regular sleep-wake schedule and try to exercise daily, particularly in the form of walking, 20-30 minutes a day, if you can.   As far as your medications are concerned, I would like to suggest: Increase Gabapentin to 400mg  three times a day  I would like to see you back in 6 months, sooner if we need to. Please call us with any interim questions, concerns, problems, updates or refill requests.   Our phone number is [phone]. We also have an after hours call service for urgent matters and there is a physician on-call for urgent questions. For any emergencies you know to call 911 or go to the nearest emergency room  Gabapentin capsules or tablets What is this medicine? GABAPENTIN (GA ba pen tin) is used to control partial seizures in adults with epilepsy. It is also used to treat certain types of nerve pain. This medicine may be used for other purposes; ask your health care provider or pharmacist if you have questions. COMMON BRAND NAME(S): Active-PAC with Gabapentin, Gabarone, Neurontin What should I tell my health care provider before I take this medicine? They need to know if you have any of these conditions: -kidney disease -suicidal thoughts, plans, or attempt; a previous suicide attempt by you or a family member -an unusual or allergic reaction to gabapentin, other medicines, foods, dyes, or preservatives -pregnant or trying to get pregnant -breast-feeding How should I use this medicine? Take this medicine by mouth with a glass of water. Follow the directions on the prescription label. You can take it with or without food. If it upsets your stomach, take it with food.Take your medicine at regular intervals. Do not take it more often than directed. Do not stop taking except on your doctor's  advice. If you are directed to break the 600 or 800 mg tablets in half as part of your dose, the extra half tablet should be used for the next dose. If you have not used the extra half tablet within 28 days, it should be thrown away. A special MedGuide will be given to you by the pharmacist with each prescription and refill. Be sure to read this information carefully each time. Talk to your pediatrician regarding the use of this medicine in children. Special care may be needed. Overdosage: If you think you have taken too much of this medicine contact a poison control center or emergency room at once. NOTE: This medicine is only for you. Do not share this medicine with others. What if I miss a dose? If you miss a dose, take it as soon as you can. If it is almost time for your next dose, take only that dose. Do not take double or extra doses. What may interact with this medicine? Do not take this medicine with any of the following medications: -other gabapentin products This medicine may also interact with the following medications: -alcohol -antacids -antihistamines for allergy, cough and cold -certain medicines for anxiety or sleep -certain medicines for depression or psychotic disturbances -homatropine; hydrocodone -naproxen -narcotic medicines (opiates) for pain -phenothiazines like chlorpromazine, mesoridazine, prochlorperazine, thioridazine This list may not describe all possible interactions. Give your health care provider a list of all the medicines, herbs, non-prescription drugs, or dietary supplements you use. Also tell them if you smoke,  drink alcohol, or use illegal drugs. Some items may interact with your medicine. What should I watch for while using this medicine? Visit your doctor or health care professional for regular checks on your progress. You may want to keep a record at home of how you feel your condition is responding to treatment. You may want to share this information  with your doctor or health care professional at each visit. You should contact your doctor or health care professional if your seizures get worse or if you have any new types of seizures. Do not stop taking this medicine or any of your seizure medicines unless instructed by your doctor or health care professional. Stopping your medicine suddenly can increase your seizures or their severity. Wear a medical identification bracelet or chain if you are taking this medicine for seizures, and carry a card that lists all your medications. You may get drowsy, dizzy, or have blurred vision. Do not drive, use machinery, or do anything that needs mental alertness until you know how this medicine affects you. To reduce dizzy or fainting spells, do not sit or stand up quickly, especially if you are an older patient. Alcohol can increase drowsiness and dizziness. Avoid alcoholic drinks. Your mouth may get dry. Chewing sugarless gum or sucking hard candy, and drinking plenty of water will help. The use of this medicine may increase the chance of suicidal thoughts or actions. Pay special attention to how you are responding while on this medicine. Any worsening of mood, or thoughts of suicide or dying should be reported to your health care professional right away. Women who become pregnant while using this medicine may enroll in the [place] Pregnancy Registry by calling 4302108789. This registry collects information about the safety of antiepileptic drug use during pregnancy. What side effects may I notice from receiving this medicine? Side effects that you should report to your doctor or health care professional as soon as possible: -allergic reactions like skin rash, itching or hives, swelling of the face, lips, or tongue -worsening of mood, thoughts or actions of suicide or dying Side effects that usually do not require medical attention (report to your doctor or health care professional if  they continue or are bothersome): -constipation -difficulty walking or controlling muscle movements -dizziness -nausea -slurred speech -tiredness -tremors -weight gain This list may not describe all possible side effects. Call your doctor for medical advice about side effects. You may report side effects to FDA at 1-800-FDA-1088. Where should I keep my medicine? Keep out of reach of children. This medicine may cause accidental overdose and death if it taken by other adults, children, or pets. Mix any unused medicine with a substance like cat litter or coffee grounds. Then throw the medicine away in a sealed container like a sealed bag or a coffee can with a lid. Do not use the medicine after the expiration date. Store at room temperature between 15 and 30 degrees C (59 and 86 degrees F). NOTE: This sheet is a summary. It may not cover all possible information. If you have questions about this medicine, talk to your doctor, pharmacist, or health care provider.  2018 Elsevier/Gold Standard (2013-03-16 15:26:50)

## 2016-07-02 NOTE — Progress Notes (Signed)
[facility]    Provider:  Dr Jaynee Eagles Referring Provider: Darcus Austin, MD Primary Care Physician:  Darcus Austin, MD   CC: tingling in legs, swelling, rash, incontinence   Interval history 07/02/2016: Patient is here for follow-up of biopsy proven small fiber neuropathy. Extensive testing has been unremarkable including SPEP, ANA, sedimentation rate, TSH, CMP, B12, vitamin D, hemoglobin A1c (per pcp notes and unremarkable) and here in our clinic: vitamin B1, CRP, Lyme, B6, heavy metals, rheumatoid factor,pan-anca, Sjogren's, angiotensin-converting enzyme, BMP. Will check b12 and hgba1c again.  She had chemotherapy with a platinum-based drug. She was tested for celiac disease in 2016 which was negative.   Interval history 03/03/2016: Here for a new problem today burning in the legs. She has new tingling in her feet. She saw a dermatologist for the rash and he gave her some topical steroids. The rash is better. The tingling is still there. She has swelling in her right foot. She has incontinence. Started a few months ago. Thought it was dry skin. Burning and tingling. The whole foot feels hot both of them. No inciting events. No new medications. Nothing new. Feels swollen behind the knees. Not bad at night, not worse at night. Symptoms are worsening, she is noticing it more. Continuous. No weakness. No change in back pain. No cramping, hands not involved. Nothing makes it better or worse, standing too long may make it worse.   Labs tested: spep, ANA, sed rate, TSH, CMP, B12, Vit D, hgba1c, urinalysis  Interval history: MRI of the brain and thoracic spine were unremarkable. A year ago she pushed a box with her foot, pain exploded up her leg, thought she pulled a muscle, she was diagnosed with bursitis, She saw orthopaedics and diagnosed with bursitis again, an injection made her whole leg go numb, She has hip and lower back pain. She has done physical therapy. [facility] [place] for 2 months. She is stretching and strengthening. She has lower back pain with radiation down both legs. She has continued weakness.   ZOX:WRUEA G Vocciis a very nice 61 y.o.femalehere as a referral from Dr. Val Eagle chronic headaches. PMHx major depression, generalized anxiety, headaches, PTSD, Stage 3 kidney disease, Osteopenia, colon caner s/p right hemicolectomy 2006. She has headaches for a year, no inciting events or head trauma but she was trying all kinds "loads" of medications for her mood disorder. Never had headaches int he past or any history of migraines. She has multiple types of headaches, occasionally will get ice pick in the right temporal area which occurs every several weeks and is brief, no eye watering or nose running but very painful, no time of day preference. She has a headache right now on the top of her head and behind the eyes, she saw an ophthalmologist who sent her stat for temporal arteritis but labwork was negative. 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 The headache is a pressure headache, more pressure and pain, consistent and continuous over 4-5 hours and a few tylenol help, she takes tylenol 3-4 times a week. She had TMS therapy for depression last year and this resulted in headaches. Headaches most days of the week,she wakes up with headaches. She snores mildly. She had an MRI of the brain but may have started after the MRI last April. The clonidine was started about the same time as the headaches. She has no significant daytime somnolence and no other signs of obstructive sleep apnea. No other focal neurologic  deficits or complaints. No double vision, no aphasia, no dysarthria, no weakness, no sensory changes. No bowel or bladder changes. No fever or systemic symptoms. She also has a lot of tightness in the neck associated with headaches.  Reviewed notes, labs and imaging from outside physicians, which showed: Patient brings in a CD with the images,  personally reviewed. MRI of the cervical spine was largely unremarkable, at C4-C5 minimal central canal and moderate right foraminal stenosis. At C5-C6 minimal central canal and moderate right foraminal stenosis.  Patient was referred by [facility] orth[facility]ics. She is seeing them for neck pain. Neck Stiffness and headaches. Symptoms exacerbated by turning the head to the right and turning the head to the left. Treated with non-opioid analgesics. Patient also reported low back pain. Mild neck discomfort, worse with range of motion and deep palpation. Exam significant for 10 beats of clonus in the lower extremities bilaterally, 3+ brisk deep tendon reflexes at the knees, Achilles, biceps, triceps and brachial radialis. Toes are flexor. Negative Hoffman sign. Normal gait pattern. Slight limp when she walks because of right-sided gluteal and lateral hip pain that she is not ataxic and she has no significant imbalance. Negative Romberg. MRI reviewed showed no cord signal changes. Mild multilevel degenerative disease no significant high-grade neural compression. No evidence to suggest cervical spondylitic myelopathy.  BMP unremarkable creatinine slightly elevated 1.02, CBC normal  Review of Systems: Patient complains of symptoms per HPI as well as the following symptoms: no fever, no chills. Pertinent negatives and positives per HPI. All others negative.   Social History   Social History  . Marital status: Married    Spouse name: Herbie Baltimore  . Number of children: 1  . Years of education: 74   Occupational History  . Accounting- Retired    Social History Main Topics  . Smoking status: Never Smoker  . Smokeless tobacco: Never Used  . Alcohol use Yes     Comment: occ  . Drug use: No  . Sexual activity: Not on file   Other Topics Concern  . Not on file   Social History Narrative   Lives with husband   Caffeine use: 2-3 cups per day    Family History  Problem Relation Age of Onset  .  Diabetes Mother        type 1  . Stroke Mother   . Dementia Mother   . Thyroid disease Mother   . Alcohol abuse Mother   . Asthma Mother   . Alcohol abuse Father   . Diabetes Father   . Cancer Paternal Aunt        germ cell  . Cancer Maternal Grandmother        colon  . Cancer Maternal Uncle        throat  . Thyroid disease Sister   . Alcohol abuse Brother   . Psychiatric Illness Brother   . Psychiatric Illness Sister   . Asthma Sister   . Neuropathy Neg Hx     Past Medical History:  Diagnosis Date  . Anxiety   . Anxiety and depression 08/24/2011  . Arthritis   . Cancer ([place])    colon  . Depression   . Hx of colon cancer, stage II 08/24/2011   T3N0  adenoca cecum resected 04/2004  Xeloda adjuvant chemo  . Hyperlipidemia 08/24/2011  . Joint pain   . Nausea & vomiting   . Neuropathy   . PONV (postoperative nausea and vomiting)     Past Surgical  History:  Procedure Laterality Date  . ABDOMINAL HYSTERECTOMY  2004  . BREAST SURGERY  2011   reduction  . CHOLECYSTECTOMY  10/05/2011   Procedure: LAPAROSCOPIC CHOLECYSTECTOMY WITH INTRAOPERATIVE CHOLANGIOGRAM;  Surgeon: Stark Klein, MD;  Location: [facility];  Service: General;  Laterality: N/A;  . [place]  . HEMICOLECTOMY Right 2006  . HERNIA REPAIR  2007  . KNEE ARTHROSCOPY  2010  . NASAL SEPTUM SURGERY  1983  . OTHER SURGICAL HISTORY  2016   Transcranial magnetic stimulation  . PORT-A-CATH REMOVAL  2006   insertion and removal  . STRABISMUS SURGERY  1962    Current Outpatient Prescriptions  Medication Sig Dispense Refill  . busPIRone (BUSPAR) 15 MG tablet Take 30 mg by mouth 2 (two) times daily.     . colestipol (COLESTID) 5 g granules Take 1 g by mouth.    . dicyclomine (BENTYL) 20 MG tablet Take 20 mg by mouth as needed for spasms.     . ergocalciferol (VITAMIN D2) 50000 UNITS capsule Take 50,000 Units by mouth once a week. Take on sundays.  Takes three weeks out of the month.    . lamoTRIgine  (LAMICTAL) 100 MG tablet Take 200 mg by mouth daily.    . simvastatin (ZOCOR) 40 MG tablet Take 40 mg by mouth every evening.    . valACYclovir (VALTREX) 500 MG tablet Take 4 tablets by mouth. Take 4 tablets and then another 4 tablets 12 hours later as needed for fever blister  0  . zolpidem (AMBIEN) 5 MG tablet Take 5 mg by mouth at bedtime as needed for sleep.    Marland Kitchen gabapentin (NEURONTIN) 400 MG capsule Take 1 capsule (400 mg total) by mouth 3 (three) times daily. 90 capsule 11  . l-methylfolate-B6-B12 (METANX) 3-35-2 MG TABS tablet Take 1 tablet by mouth 2 (two) times daily. 60 tablet 12   No current facility-administered medications for this visit.     Allergies as of 07/02/2016 - Review Complete 07/02/2016  Allergen Reaction Noted  . Demeclocycline Nausea Only 08/24/2011  . Dilaudid [hydromorphone hcl] Itching 08/24/2011  . Fentanyl Itching 08/22/2012  . Morphine and related Itching 08/24/2011  . Percocet [oxycodone-acetaminophen]  08/21/2013  . Tetracyclines & related Nausea Only 08/24/2011  . Sulfa antibiotics Rash 08/24/2011    Vitals: BP 133/81   Pulse 74   Ht 5' 4.6" (1.641 m)   Wt 177 lb 12.8 oz (80.6 kg)   BMI 29.96 kg/m  Last Weight:  Wt Readings from Last 1 Encounters:  07/02/16 177 lb 12.8 oz (80.6 kg)   Last Height:   Ht Readings from Last 1 Encounters:  07/02/16 5' 4.6" (1.641 m)   Strength intact. No significant decrease in any sensory modality distally in the extremities. Gait normal. Brisk reflexes with clonus in the lower extremities, toes downgoing.   Assessment/Plan:Very nice 61 y.o. female here as a referral from Dr. Rolena Infante for chronic headaches and low back pain. PMHx major depression, generalized anxiety, headaches, PTSD, Stage 3 kidney disease, Osteopenia, colon caner s/p right hemicolectomy 2006. Patient also has some interesting physical findings including bilateral lower extremity clonus at the patellars and the ankle jerks but imaging of the  brain, cervical and thoracic spine unremarkable for UMN disease will follow clinically.    She has biopsy proven small-fiber neuropathy, discussed findings and reviewed biopsy results,  most significant risk factor found so far is previous platinum-based chemotherapy. Had a long discussion about small-fiber neuropathy, causes and progression.  Discussed the causes of peripheral neuropathy, the most common being diabetes which patient does not report having. About 20 million people in the [place] states have some form of peripheral neuropathy. This is a condition that develops as a result of damage to the peripheral nervous system.  There are multiple causes eluding metabolic, toxic, infectious and endocrine disorders, small vessel disease, autoimmune diseases, and others. We have  performed extensive serum neuropathy screening as well as an EMG nerve conduction study.   Orders Placed This Encounter  Procedures  . B12 and Folate Panel  . Methylmalonic acid, serum  . Hemoglobin A1c  . Basic Metabolic Panel   Sarina Ill, MD  [facility] [address] [place], [place] [address]  Phone [phone] Fax [phone]  A total of 45 minutes was spent face-to-face with this patient. Over half this time was spent on counseling patient on the small-fiber neuropathy diagnosis and different diagnostic and therapeutic options available.

## 2016-07-04 ENCOUNTER — Encounter: Payer: Self-pay | Admitting: Neurology

## 2016-07-06 DIAGNOSIS — F4310 Post-traumatic stress disorder, unspecified: Secondary | ICD-10-CM | POA: Diagnosis not present

## 2016-07-06 DIAGNOSIS — F411 Generalized anxiety disorder: Secondary | ICD-10-CM | POA: Diagnosis not present

## 2016-07-06 DIAGNOSIS — F606 Avoidant personality disorder: Secondary | ICD-10-CM | POA: Diagnosis not present

## 2016-07-06 DIAGNOSIS — F341 Dysthymic disorder: Secondary | ICD-10-CM | POA: Diagnosis not present

## 2016-07-07 DIAGNOSIS — Z01419 Encounter for gynecological examination (general) (routine) without abnormal findings: Secondary | ICD-10-CM | POA: Diagnosis not present

## 2016-07-07 DIAGNOSIS — Z6829 Body mass index (BMI) 29.0-29.9, adult: Secondary | ICD-10-CM | POA: Diagnosis not present

## 2016-07-07 LAB — BASIC METABOLIC PANEL
BUN/Creatinine Ratio: 22 (ref 12–28)
BUN: 22 mg/dL (ref 8–27)
CO2: 23 mmol/L (ref 18–29)
Calcium: 9 mg/dL (ref 8.7–10.3)
Chloride: 105 mmol/L (ref 96–106)
Creatinine, Ser: 1.01 mg/dL — ABNORMAL HIGH (ref 0.57–1.00)
GFR calc Af Amer: 69 mL/min/{1.73_m2} (ref >59)
GFR calc non Af Amer: 60 mL/min/{1.73_m2} (ref >59)
Glucose: 92 mg/dL (ref 65–99)
Potassium: 4.8 mmol/L (ref 3.5–5.2)
Sodium: 143 mmol/L (ref 134–144)

## 2016-07-07 LAB — HEMOGLOBIN A1C
Est. average glucose Bld gHb Est-mCnc: 108 mg/dL
Hgb A1c MFr Bld: 5.4 % (ref 4.8–5.6)

## 2016-07-07 LAB — B12 AND FOLATE PANEL
Folate: 8.8 ng/mL (ref >3.0)
Vitamin B-12: 623 pg/mL (ref 232–1245)

## 2016-07-07 LAB — METHYLMALONIC ACID, SERUM: Methylmalonic Acid: 158 nmol/L (ref 0–378)

## 2016-07-09 ENCOUNTER — Telehealth: Payer: Self-pay | Admitting: *Deleted

## 2016-07-09 NOTE — Telephone Encounter (Signed)
LVM informing patient her Creatinine is 1.01, but this is stable or improved. Otherwise her labs are normal. Left number and advised office is now closed, reopens Monday at 8 am.

## 2016-07-13 DIAGNOSIS — F4310 Post-traumatic stress disorder, unspecified: Secondary | ICD-10-CM | POA: Diagnosis not present

## 2016-07-13 DIAGNOSIS — F606 Avoidant personality disorder: Secondary | ICD-10-CM | POA: Diagnosis not present

## 2016-07-13 DIAGNOSIS — F411 Generalized anxiety disorder: Secondary | ICD-10-CM | POA: Diagnosis not present

## 2016-07-13 DIAGNOSIS — F341 Dysthymic disorder: Secondary | ICD-10-CM | POA: Diagnosis not present

## 2016-07-22 DIAGNOSIS — F411 Generalized anxiety disorder: Secondary | ICD-10-CM | POA: Diagnosis not present

## 2016-07-22 DIAGNOSIS — F341 Dysthymic disorder: Secondary | ICD-10-CM | POA: Diagnosis not present

## 2016-07-22 DIAGNOSIS — F606 Avoidant personality disorder: Secondary | ICD-10-CM | POA: Diagnosis not present

## 2016-07-22 DIAGNOSIS — F4310 Post-traumatic stress disorder, unspecified: Secondary | ICD-10-CM | POA: Diagnosis not present

## 2016-07-26 DIAGNOSIS — Z85828 Personal history of other malignant neoplasm of skin: Secondary | ICD-10-CM | POA: Diagnosis not present

## 2016-07-26 DIAGNOSIS — R6884 Jaw pain: Secondary | ICD-10-CM | POA: Diagnosis not present

## 2016-07-26 DIAGNOSIS — D229 Melanocytic nevi, unspecified: Secondary | ICD-10-CM | POA: Diagnosis not present

## 2016-07-27 DIAGNOSIS — F4310 Post-traumatic stress disorder, unspecified: Secondary | ICD-10-CM | POA: Diagnosis not present

## 2016-07-27 DIAGNOSIS — F341 Dysthymic disorder: Secondary | ICD-10-CM | POA: Diagnosis not present

## 2016-07-27 DIAGNOSIS — F606 Avoidant personality disorder: Secondary | ICD-10-CM | POA: Diagnosis not present

## 2016-07-27 DIAGNOSIS — F411 Generalized anxiety disorder: Secondary | ICD-10-CM | POA: Diagnosis not present

## 2016-08-02 DIAGNOSIS — R6884 Jaw pain: Secondary | ICD-10-CM | POA: Diagnosis not present

## 2016-08-03 DIAGNOSIS — F411 Generalized anxiety disorder: Secondary | ICD-10-CM | POA: Diagnosis not present

## 2016-08-03 DIAGNOSIS — F4310 Post-traumatic stress disorder, unspecified: Secondary | ICD-10-CM | POA: Diagnosis not present

## 2016-08-03 DIAGNOSIS — F341 Dysthymic disorder: Secondary | ICD-10-CM | POA: Diagnosis not present

## 2016-08-03 DIAGNOSIS — F606 Avoidant personality disorder: Secondary | ICD-10-CM | POA: Diagnosis not present

## 2016-08-09 DIAGNOSIS — R6884 Jaw pain: Secondary | ICD-10-CM | POA: Diagnosis not present

## 2016-08-10 DIAGNOSIS — F4310 Post-traumatic stress disorder, unspecified: Secondary | ICD-10-CM | POA: Diagnosis not present

## 2016-08-10 DIAGNOSIS — F606 Avoidant personality disorder: Secondary | ICD-10-CM | POA: Diagnosis not present

## 2016-08-10 DIAGNOSIS — F411 Generalized anxiety disorder: Secondary | ICD-10-CM | POA: Diagnosis not present

## 2016-08-10 DIAGNOSIS — F341 Dysthymic disorder: Secondary | ICD-10-CM | POA: Diagnosis not present

## 2016-08-13 ENCOUNTER — Ambulatory Visit (INDEPENDENT_AMBULATORY_CARE_PROVIDER_SITE_OTHER): Payer: BLUE CROSS/BLUE SHIELD

## 2016-08-13 ENCOUNTER — Ambulatory Visit (INDEPENDENT_AMBULATORY_CARE_PROVIDER_SITE_OTHER): Payer: BLUE CROSS/BLUE SHIELD | Admitting: Podiatry

## 2016-08-13 ENCOUNTER — Encounter: Payer: Self-pay | Admitting: Podiatry

## 2016-08-13 VITALS — BP 126/74

## 2016-08-13 DIAGNOSIS — M25571 Pain in right ankle and joints of right foot: Secondary | ICD-10-CM | POA: Diagnosis not present

## 2016-08-13 DIAGNOSIS — R52 Pain, unspecified: Secondary | ICD-10-CM | POA: Diagnosis not present

## 2016-08-13 DIAGNOSIS — M2041 Other hammer toe(s) (acquired), right foot: Secondary | ICD-10-CM

## 2016-08-13 DIAGNOSIS — G629 Polyneuropathy, unspecified: Secondary | ICD-10-CM | POA: Diagnosis not present

## 2016-08-13 DIAGNOSIS — M25572 Pain in left ankle and joints of left foot: Secondary | ICD-10-CM

## 2016-08-13 DIAGNOSIS — M2042 Other hammer toe(s) (acquired), left foot: Secondary | ICD-10-CM

## 2016-08-13 NOTE — Progress Notes (Signed)
   Subjective:    Patient ID: Lorraine May, female    DOB: 12-Dec-1955, 61 y.o.   MRN: [id number]  HPI 61 year old female presents the office today for concerns of her toes curling and her foot is become wider over time. She states that she occasionally gets pain to her ankles and this has been ongoing for about 8 months. She denies any specific injury or trauma to her feet. She denies any swelling or redness. She has been diagnosed with neuropathy which is most likely coming from her chemotherapy. She is on gabapentin for this which does help. She did get some new shoes which helped some of the added pads to her shoes which she will cut the shoes which is actually made the symptoms worse. Upon evaluation of his shoes it is a dancer's pad that the apply to the shoes. She said no recent treatment for her feet. The pain does not wake her up at night is mostly with weightbearing or pressure. She has no other concerns today.   Review of Systems  Cardiovascular: Positive for leg swelling.  Genitourinary: Positive for urgency.       Increased urination  Musculoskeletal:       Ankle and joint pain  Neurological: Positive for light-headedness.  All other systems reviewed and are negative.      Objective:   Physical Exam General: AAO x3, NAD  Dermatological: Skin is warm, dry and supple bilateral. Nails x 10 are well manicured; remaining integument appears unremarkable at this time. There are no open sores, no preulcerative lesions, no rash or signs of infection present.  Vascular: Dorsalis Pedis artery and Posterior Tibial artery pedal pulses are 2/4 bilateral with immedate capillary fill time. Pedal hair growth present. There is no pain with calf compression, swelling, warmth, erythema.   Neruologic: Grossly intact via light touch bilateral. Vibratory intact via tuning fork bilateral. Protective threshold with Semmes Wienstein monofilament intact to all pedal sites bilateral. Although her  sensation appears to be intact she has been diagnosed with neuropathy on nerve conduction test.  Musculoskeletal: There is no area of tenderness on the tibia, fibula or with ankle joint range of motion. There is mild discomfort on the sinus tarsi and along lateral aspect of the foot. Hammertoe contractures are present. There is no specific area pinpoint bony tenderness or pain the vibratory sensation to the foot or ankle. Muscular strength 5/5 in all groups tested bilateral.  Gait: Unassisted, Nonantalgic.      Assessment & Plan:  61 year old female with bilateral ankle pain, hammertoes likely due to biomechanical changes -Treatment options discussed including all alternatives, risks, and complications -Etiology of symptoms were discussed -X-rays were obtained and reviewed with the patient. No evidence of acute fracture identified today. -A her symptoms are due to her foot type and biomechanical changes. Discussed that she give modifications by also discussed with her orthotics. She wishes to proceed with custom inserts for shoes. She was molded for inserts then they were sent to [facility] labs. -Discussed the likely progression of the digital deformity. Hopefully inserted help slow this has not been a corrected and she understands this.  Celesta Gentile, DPM

## 2016-08-16 DIAGNOSIS — R6884 Jaw pain: Secondary | ICD-10-CM | POA: Diagnosis not present

## 2016-08-17 DIAGNOSIS — F606 Avoidant personality disorder: Secondary | ICD-10-CM | POA: Diagnosis not present

## 2016-08-17 DIAGNOSIS — M79675 Pain in left toe(s): Secondary | ICD-10-CM | POA: Diagnosis not present

## 2016-08-17 DIAGNOSIS — F4310 Post-traumatic stress disorder, unspecified: Secondary | ICD-10-CM | POA: Diagnosis not present

## 2016-08-17 DIAGNOSIS — F411 Generalized anxiety disorder: Secondary | ICD-10-CM | POA: Diagnosis not present

## 2016-08-17 DIAGNOSIS — F341 Dysthymic disorder: Secondary | ICD-10-CM | POA: Diagnosis not present

## 2016-08-19 DIAGNOSIS — Z23 Encounter for immunization: Secondary | ICD-10-CM | POA: Diagnosis not present

## 2016-08-23 DIAGNOSIS — R6884 Jaw pain: Secondary | ICD-10-CM | POA: Diagnosis not present

## 2016-08-24 DIAGNOSIS — F341 Dysthymic disorder: Secondary | ICD-10-CM | POA: Diagnosis not present

## 2016-08-24 DIAGNOSIS — F411 Generalized anxiety disorder: Secondary | ICD-10-CM | POA: Diagnosis not present

## 2016-08-24 DIAGNOSIS — F4310 Post-traumatic stress disorder, unspecified: Secondary | ICD-10-CM | POA: Diagnosis not present

## 2016-08-24 DIAGNOSIS — F606 Avoidant personality disorder: Secondary | ICD-10-CM | POA: Diagnosis not present

## 2016-08-30 DIAGNOSIS — R6884 Jaw pain: Secondary | ICD-10-CM | POA: Diagnosis not present

## 2016-08-31 DIAGNOSIS — F341 Dysthymic disorder: Secondary | ICD-10-CM | POA: Diagnosis not present

## 2016-08-31 DIAGNOSIS — F4310 Post-traumatic stress disorder, unspecified: Secondary | ICD-10-CM | POA: Diagnosis not present

## 2016-08-31 DIAGNOSIS — F606 Avoidant personality disorder: Secondary | ICD-10-CM | POA: Diagnosis not present

## 2016-08-31 DIAGNOSIS — F411 Generalized anxiety disorder: Secondary | ICD-10-CM | POA: Diagnosis not present

## 2016-09-03 ENCOUNTER — Ambulatory Visit (INDEPENDENT_AMBULATORY_CARE_PROVIDER_SITE_OTHER): Payer: Self-pay

## 2016-09-03 DIAGNOSIS — M2042 Other hammer toe(s) (acquired), left foot: Secondary | ICD-10-CM

## 2016-09-03 DIAGNOSIS — M25572 Pain in left ankle and joints of left foot: Secondary | ICD-10-CM

## 2016-09-03 DIAGNOSIS — M2041 Other hammer toe(s) (acquired), right foot: Secondary | ICD-10-CM

## 2016-09-03 DIAGNOSIS — M25571 Pain in right ankle and joints of right foot: Secondary | ICD-10-CM

## 2016-09-03 DIAGNOSIS — G629 Polyneuropathy, unspecified: Secondary | ICD-10-CM

## 2016-09-03 NOTE — Patient Instructions (Signed)

## 2016-09-03 NOTE — Progress Notes (Signed)
Patient presents for orthotic pick up.  Verbal and written break in and wear instructions given.  Patient will follow up in 4 weeks if symptoms worsen or fail to improve. 

## 2016-09-06 DIAGNOSIS — R6884 Jaw pain: Secondary | ICD-10-CM | POA: Diagnosis not present

## 2016-09-06 DIAGNOSIS — E669 Obesity, unspecified: Secondary | ICD-10-CM | POA: Diagnosis not present

## 2016-09-06 DIAGNOSIS — R1033 Periumbilical pain: Secondary | ICD-10-CM | POA: Diagnosis not present

## 2016-09-13 DIAGNOSIS — R6884 Jaw pain: Secondary | ICD-10-CM | POA: Diagnosis not present

## 2016-09-14 DIAGNOSIS — F606 Avoidant personality disorder: Secondary | ICD-10-CM | POA: Diagnosis not present

## 2016-09-14 DIAGNOSIS — F4310 Post-traumatic stress disorder, unspecified: Secondary | ICD-10-CM | POA: Diagnosis not present

## 2016-09-14 DIAGNOSIS — F411 Generalized anxiety disorder: Secondary | ICD-10-CM | POA: Diagnosis not present

## 2016-09-14 DIAGNOSIS — F341 Dysthymic disorder: Secondary | ICD-10-CM | POA: Diagnosis not present

## 2016-09-21 DIAGNOSIS — D3132 Benign neoplasm of left choroid: Secondary | ICD-10-CM | POA: Diagnosis not present

## 2016-09-21 DIAGNOSIS — F341 Dysthymic disorder: Secondary | ICD-10-CM | POA: Diagnosis not present

## 2016-09-21 DIAGNOSIS — F4310 Post-traumatic stress disorder, unspecified: Secondary | ICD-10-CM | POA: Diagnosis not present

## 2016-09-21 DIAGNOSIS — H25042 Posterior subcapsular polar age-related cataract, left eye: Secondary | ICD-10-CM | POA: Diagnosis not present

## 2016-09-21 DIAGNOSIS — F411 Generalized anxiety disorder: Secondary | ICD-10-CM | POA: Diagnosis not present

## 2016-09-21 DIAGNOSIS — H53001 Unspecified amblyopia, right eye: Secondary | ICD-10-CM | POA: Diagnosis not present

## 2016-09-21 DIAGNOSIS — H5203 Hypermetropia, bilateral: Secondary | ICD-10-CM | POA: Diagnosis not present

## 2016-09-21 DIAGNOSIS — F606 Avoidant personality disorder: Secondary | ICD-10-CM | POA: Diagnosis not present

## 2016-09-21 DIAGNOSIS — R6884 Jaw pain: Secondary | ICD-10-CM | POA: Diagnosis not present

## 2016-09-22 DIAGNOSIS — M7741 Metatarsalgia, right foot: Secondary | ICD-10-CM | POA: Diagnosis not present

## 2016-09-22 DIAGNOSIS — M2022 Hallux rigidus, left foot: Secondary | ICD-10-CM | POA: Diagnosis not present

## 2016-09-22 DIAGNOSIS — M2021 Hallux rigidus, right foot: Secondary | ICD-10-CM | POA: Diagnosis not present

## 2016-09-22 DIAGNOSIS — M7742 Metatarsalgia, left foot: Secondary | ICD-10-CM | POA: Diagnosis not present

## 2016-09-23 DIAGNOSIS — F332 Major depressive disorder, recurrent severe without psychotic features: Secondary | ICD-10-CM | POA: Diagnosis not present

## 2016-09-27 DIAGNOSIS — R6884 Jaw pain: Secondary | ICD-10-CM | POA: Diagnosis not present

## 2016-09-28 DIAGNOSIS — F341 Dysthymic disorder: Secondary | ICD-10-CM | POA: Diagnosis not present

## 2016-09-28 DIAGNOSIS — F606 Avoidant personality disorder: Secondary | ICD-10-CM | POA: Diagnosis not present

## 2016-09-28 DIAGNOSIS — F4310 Post-traumatic stress disorder, unspecified: Secondary | ICD-10-CM | POA: Diagnosis not present

## 2016-09-28 DIAGNOSIS — F411 Generalized anxiety disorder: Secondary | ICD-10-CM | POA: Diagnosis not present

## 2016-10-05 DIAGNOSIS — R6884 Jaw pain: Secondary | ICD-10-CM | POA: Diagnosis not present

## 2016-10-06 DIAGNOSIS — N952 Postmenopausal atrophic vaginitis: Secondary | ICD-10-CM | POA: Diagnosis not present

## 2016-10-11 DIAGNOSIS — R6884 Jaw pain: Secondary | ICD-10-CM | POA: Diagnosis not present

## 2016-10-12 DIAGNOSIS — F411 Generalized anxiety disorder: Secondary | ICD-10-CM | POA: Diagnosis not present

## 2016-10-12 DIAGNOSIS — F606 Avoidant personality disorder: Secondary | ICD-10-CM | POA: Diagnosis not present

## 2016-10-12 DIAGNOSIS — F4310 Post-traumatic stress disorder, unspecified: Secondary | ICD-10-CM | POA: Diagnosis not present

## 2016-10-12 DIAGNOSIS — F341 Dysthymic disorder: Secondary | ICD-10-CM | POA: Diagnosis not present

## 2016-10-18 DIAGNOSIS — R6884 Jaw pain: Secondary | ICD-10-CM | POA: Diagnosis not present

## 2016-10-19 DIAGNOSIS — F341 Dysthymic disorder: Secondary | ICD-10-CM | POA: Diagnosis not present

## 2016-10-19 DIAGNOSIS — F606 Avoidant personality disorder: Secondary | ICD-10-CM | POA: Diagnosis not present

## 2016-10-19 DIAGNOSIS — F4310 Post-traumatic stress disorder, unspecified: Secondary | ICD-10-CM | POA: Diagnosis not present

## 2016-10-19 DIAGNOSIS — F411 Generalized anxiety disorder: Secondary | ICD-10-CM | POA: Diagnosis not present

## 2016-10-25 DIAGNOSIS — R6884 Jaw pain: Secondary | ICD-10-CM | POA: Diagnosis not present

## 2016-10-26 DIAGNOSIS — F4310 Post-traumatic stress disorder, unspecified: Secondary | ICD-10-CM | POA: Diagnosis not present

## 2016-10-26 DIAGNOSIS — F341 Dysthymic disorder: Secondary | ICD-10-CM | POA: Diagnosis not present

## 2016-10-26 DIAGNOSIS — F411 Generalized anxiety disorder: Secondary | ICD-10-CM | POA: Diagnosis not present

## 2016-10-26 DIAGNOSIS — F606 Avoidant personality disorder: Secondary | ICD-10-CM | POA: Diagnosis not present

## 2016-11-01 DIAGNOSIS — R6884 Jaw pain: Secondary | ICD-10-CM | POA: Diagnosis not present

## 2016-11-02 DIAGNOSIS — F606 Avoidant personality disorder: Secondary | ICD-10-CM | POA: Diagnosis not present

## 2016-11-02 DIAGNOSIS — F4310 Post-traumatic stress disorder, unspecified: Secondary | ICD-10-CM | POA: Diagnosis not present

## 2016-11-02 DIAGNOSIS — F411 Generalized anxiety disorder: Secondary | ICD-10-CM | POA: Diagnosis not present

## 2016-11-02 DIAGNOSIS — F341 Dysthymic disorder: Secondary | ICD-10-CM | POA: Diagnosis not present

## 2016-11-08 DIAGNOSIS — R6884 Jaw pain: Secondary | ICD-10-CM | POA: Diagnosis not present

## 2016-11-09 DIAGNOSIS — F411 Generalized anxiety disorder: Secondary | ICD-10-CM | POA: Diagnosis not present

## 2016-11-09 DIAGNOSIS — F341 Dysthymic disorder: Secondary | ICD-10-CM | POA: Diagnosis not present

## 2016-11-09 DIAGNOSIS — F4310 Post-traumatic stress disorder, unspecified: Secondary | ICD-10-CM | POA: Diagnosis not present

## 2016-11-09 DIAGNOSIS — F606 Avoidant personality disorder: Secondary | ICD-10-CM | POA: Diagnosis not present

## 2016-11-15 DIAGNOSIS — R6884 Jaw pain: Secondary | ICD-10-CM | POA: Diagnosis not present

## 2016-11-16 DIAGNOSIS — F4310 Post-traumatic stress disorder, unspecified: Secondary | ICD-10-CM | POA: Diagnosis not present

## 2016-11-16 DIAGNOSIS — F341 Dysthymic disorder: Secondary | ICD-10-CM | POA: Diagnosis not present

## 2016-11-16 DIAGNOSIS — F606 Avoidant personality disorder: Secondary | ICD-10-CM | POA: Diagnosis not present

## 2016-11-16 DIAGNOSIS — F411 Generalized anxiety disorder: Secondary | ICD-10-CM | POA: Diagnosis not present

## 2016-11-18 DIAGNOSIS — Z23 Encounter for immunization: Secondary | ICD-10-CM | POA: Diagnosis not present

## 2016-11-23 DIAGNOSIS — F4310 Post-traumatic stress disorder, unspecified: Secondary | ICD-10-CM | POA: Diagnosis not present

## 2016-11-23 DIAGNOSIS — F411 Generalized anxiety disorder: Secondary | ICD-10-CM | POA: Diagnosis not present

## 2016-11-23 DIAGNOSIS — F606 Avoidant personality disorder: Secondary | ICD-10-CM | POA: Diagnosis not present

## 2016-11-23 DIAGNOSIS — F341 Dysthymic disorder: Secondary | ICD-10-CM | POA: Diagnosis not present

## 2016-12-06 ENCOUNTER — Ambulatory Visit: Payer: BLUE CROSS/BLUE SHIELD | Admitting: Neurology

## 2016-12-13 ENCOUNTER — Telehealth: Payer: Self-pay | Admitting: *Deleted

## 2016-12-13 ENCOUNTER — Ambulatory Visit: Payer: BLUE CROSS/BLUE SHIELD | Admitting: Neurology

## 2016-12-13 ENCOUNTER — Telehealth: Payer: Self-pay | Admitting: Neurology

## 2016-12-13 DIAGNOSIS — R42 Dizziness and giddiness: Secondary | ICD-10-CM

## 2016-12-13 DIAGNOSIS — R6884 Jaw pain: Secondary | ICD-10-CM | POA: Diagnosis not present

## 2016-12-13 NOTE — Telephone Encounter (Signed)
Called to schedule appointment soonest available January 2nd @ 3:00 arrival time 2:30. Patient verbalized understanding. She also reported that she has had vertigo issues since her head was turned during a massage. Dr. Jaynee Eagles aware and gave verbal order for OP vestibular therapy w/ PT. Patient verbalized understanding and appreciation.

## 2016-12-13 NOTE — Telephone Encounter (Signed)
Patient called to reschedule the apt Dr. Jaynee Eagles canceled. She stated that the nurse said there were some openings she could be moved to. I offered her the first available in Jan. But she wants to know if there is anything sooner. Please call and advise.

## 2016-12-14 DIAGNOSIS — F341 Dysthymic disorder: Secondary | ICD-10-CM | POA: Diagnosis not present

## 2016-12-14 DIAGNOSIS — F411 Generalized anxiety disorder: Secondary | ICD-10-CM | POA: Diagnosis not present

## 2016-12-14 DIAGNOSIS — F606 Avoidant personality disorder: Secondary | ICD-10-CM | POA: Diagnosis not present

## 2016-12-14 DIAGNOSIS — F4310 Post-traumatic stress disorder, unspecified: Secondary | ICD-10-CM | POA: Diagnosis not present

## 2016-12-15 DIAGNOSIS — F332 Major depressive disorder, recurrent severe without psychotic features: Secondary | ICD-10-CM | POA: Diagnosis not present

## 2016-12-20 DIAGNOSIS — R6884 Jaw pain: Secondary | ICD-10-CM | POA: Diagnosis not present

## 2016-12-21 DIAGNOSIS — F411 Generalized anxiety disorder: Secondary | ICD-10-CM | POA: Diagnosis not present

## 2016-12-21 DIAGNOSIS — F4310 Post-traumatic stress disorder, unspecified: Secondary | ICD-10-CM | POA: Diagnosis not present

## 2016-12-21 DIAGNOSIS — F606 Avoidant personality disorder: Secondary | ICD-10-CM | POA: Diagnosis not present

## 2016-12-21 DIAGNOSIS — F341 Dysthymic disorder: Secondary | ICD-10-CM | POA: Diagnosis not present

## 2016-12-27 DIAGNOSIS — R6884 Jaw pain: Secondary | ICD-10-CM | POA: Diagnosis not present

## 2017-01-03 DIAGNOSIS — R6884 Jaw pain: Secondary | ICD-10-CM | POA: Diagnosis not present

## 2017-01-04 DIAGNOSIS — F606 Avoidant personality disorder: Secondary | ICD-10-CM | POA: Diagnosis not present

## 2017-01-04 DIAGNOSIS — F4310 Post-traumatic stress disorder, unspecified: Secondary | ICD-10-CM | POA: Diagnosis not present

## 2017-01-04 DIAGNOSIS — F341 Dysthymic disorder: Secondary | ICD-10-CM | POA: Diagnosis not present

## 2017-01-04 DIAGNOSIS — F411 Generalized anxiety disorder: Secondary | ICD-10-CM | POA: Diagnosis not present

## 2017-01-05 ENCOUNTER — Other Ambulatory Visit: Payer: Self-pay

## 2017-01-05 ENCOUNTER — Ambulatory Visit: Payer: BLUE CROSS/BLUE SHIELD | Attending: Neurology | Admitting: Physical Therapy

## 2017-01-05 ENCOUNTER — Encounter: Payer: Self-pay | Admitting: Physical Therapy

## 2017-01-05 DIAGNOSIS — H8111 Benign paroxysmal vertigo, right ear: Secondary | ICD-10-CM | POA: Insufficient documentation

## 2017-01-05 DIAGNOSIS — R2681 Unsteadiness on feet: Secondary | ICD-10-CM | POA: Diagnosis not present

## 2017-01-05 DIAGNOSIS — R42 Dizziness and giddiness: Secondary | ICD-10-CM | POA: Insufficient documentation

## 2017-01-05 NOTE — Patient Instructions (Signed)
Rolling    With pillow under head, start on back. Roll semi quickly to right. Hold position until symptoms subside plus 20 seconds. Roll semi quickly onto left side. Hold position until symptoms subside plus 20 seconds. Repeat sequence _3-5___ times per session. Do __2-3__ sessions per day.  Tip Card  1.The goal of habituation training is to assist in decreasing symptoms of vertigo, dizziness, or nausea provoked by specific head and body motions. 2.These exercises may initially increase symptoms; however, be persistent and work through symptoms. With repetition and time, the exercises will assist in reducing or eliminating symptoms. 3.Exercises should be stopped and discussed with the therapist if you experience any of the following: - Sudden change or fluctuation in hearing - New onset of ringing in the ears, or increase in current intensity - Any fluid discharge from the ear - Severe pain in neck or back - Extreme nausea  Copyright  VHI. All rights reserved.

## 2017-01-05 NOTE — Therapy (Signed)
[facility] [address] [place], [place], [address] Phone: [phone]   Fax:  [phone]  Physical Therapy Evaluation  Patient Details  Name: Lorraine May MRN: [id number] Date of Birth: [birth year] Referring Provider: Dr. Sarina Ill   Encounter Date: 01/05/2017  PT End of Session - 01/05/17 1326    Visit Number  1    Number of Visits  6    Date for PT Re-Evaluation  02/16/17    Authorization Type  BCBS 40% coinsurance; 30 visit limit    PT Start Time  1230    PT Stop Time  1314    PT Time Calculation (min)  44 min    Activity Tolerance  Patient tolerated treatment well    Behavior During Therapy  [facility] for tasks assessed/performed       Past Medical History:  Diagnosis Date  . Anxiety   . Anxiety and depression 08/24/2011  . Arthritis   . Cancer ([place])    colon  . Depression   . Hx of colon cancer, stage II 08/24/2011   T3N0  adenoca cecum resected 04/2004  Xeloda adjuvant chemo  . Hyperlipidemia 08/24/2011  . Joint pain   . Nausea & vomiting   . Neuropathy   . PONV (postoperative nausea and vomiting)     Past Surgical History:  Procedure Laterality Date  . ABDOMINAL HYSTERECTOMY  2004  . BREAST SURGERY  2011   reduction  . CHOLECYSTECTOMY  10/05/2011   Procedure: LAPAROSCOPIC CHOLECYSTECTOMY WITH INTRAOPERATIVE CHOLANGIOGRAM;  Surgeon: Stark Klein, MD;  Location: [facility];  Service: General;  Laterality: N/A;  . [place]  . HEMICOLECTOMY Right 2006  . HERNIA REPAIR  2007  . KNEE ARTHROSCOPY  2010  . NASAL SEPTUM SURGERY  1983  . OTHER SURGICAL HISTORY  2016   Transcranial magnetic stimulation  . PORT-A-CATH REMOVAL  2006   insertion and removal  . STRABISMUS SURGERY  1962    There were no vitals filed for this visit.   Subjective Assessment - 01/05/17 1233    Subjective  Pt is a 61 y/o female who presents to OPPT for vertigo which began ~ 2-3 weeks ago.  Pt reports  symptoms began during a massage when head was turned causing spinning sensation.  Pt reports symptoms have improved but not resolved at this point.      Pertinent History  neuropathy     Patient Stated Goals  improve dizziness    Currently in Pain?  No/denies         [facility] PT Assessment - 01/05/17 1235      Assessment   Medical Diagnosis  vertigo    Referring Provider  Dr. Sarina Ill    Onset Date/Surgical Date  -- ~ 1 month ago    Hand Dominance  Right    Next MD Visit  02/03/16    Prior Therapy  none      Precautions   Precautions  None      Restrictions   Weight Bearing Restrictions  No      Balance Screen   Has the patient fallen in the past 6 months  No    Has the patient had a decrease in activity level because of a fear of falling?   No    Is the patient reluctant to leave their home because of a fear of falling?   No      Home Environment   Living Environment  Private residence    Living Arrangements  Spouse/significant other    Type of [place] to enter    Entrance Stairs-Number of Steps  3    Entrance Stairs-Rails  Right    [place]  Two level;Able to live on main level with bedroom/bathroom    Additional Comments  denies difficulty with stairs      Prior Function   Level of Independence  Independent    Vocation  Retired    [employer]  retired Engineer, maintenance (IT)    Leisure  read, Tree surgeon, Hospital doctor; no regular exercise      Cognition   Overall Cognitive Status  Within Functional Limits for tasks assessed      Observation/Other Assessments   Focus on Therapeutic Outcomes (FOTO)   97 (3% limited; predicted 12% limited)      Ambulation/Gait   Gait Comments  pt ambulated independently without AD in clinic; c/o decreased balance due to neuropathy but no significant deviations noted today with functional activities         Vestibular Assessment - 01/05/17 1238      Vestibular Assessment   General Observation  no  symptoms at rest      Symptom Behavior   Type of Dizziness  Spinning disoriented    Frequency of Dizziness  few times a day    Duration of Dizziness  couple seconds    Aggravating Factors  Turning head quickly;Rolling to right;Rolling to left Rt worse than Lt    Relieving Factors  Head stationary      Occulomotor Exam   Occulomotor Alignment  Normal    Spontaneous  Absent    Gaze-induced  Absent    Smooth Pursuits  Intact    Saccades  Intact      Vestibulo-Occular Reflex   VOR 1 Head Only (x 1 viewing)  WNL - with increase in symptoms    Comment  head impulse test positive to Rt      Positional Testing   Dix-Hallpike  Dix-Hallpike Right;Dix-Hallpike Left    Sidelying Test  Sidelying Right;Sidelying Left    Horizontal Canal Testing  Horizontal Canal Right;Horizontal Canal Left      Dix-Hallpike Right   Dix-Hallpike Right Duration  none    Dix-Hallpike Right Symptoms  No nystagmus      Dix-Hallpike Left   Dix-Hallpike Left Duration  none    Dix-Hallpike Left Symptoms  No nystagmus      Sidelying Right   Sidelying Right Duration  < 1 second with moving into position - no nystagmus seen    Sidelying Right Symptoms  No nystagmus      Sidelying Left   Sidelying Left Duration  none    Sidelying Left Symptoms  No nystagmus      Horizontal Canal Right   Horizontal Canal Right Duration  4 sec    Horizontal Canal Right Symptoms  Geotrophic      Horizontal Canal Left   Horizontal Canal Left Duration  none    Horizontal Canal Left Symptoms  Normal         Objective measurements completed on examination: See above findings.       Vestibular Treatment/Exercise - 01/05/17 1321      Vestibular Treatment/Exercise   Vestibular Treatment Provided  Canalith Repositioning    Canalith Repositioning  Canal Roll Right    Habituation Exercises  Horizontal Roll      Canal Roll Right  Number of Reps   1    Overall Response   Improved Symptoms    Response Details   no  symptoms following canal roll; deferred additional reps at this time      Horizontal Roll   Symptom Description   instructed in HEP            PT Education - 01/05/17 1322    Education provided  Yes    Education Details  habituation-rolling; BPPV handout    Person(s) Educated  Patient    Methods  Explanation;Demonstration;Handout    Comprehension  Verbalized understanding          PT Long Term Goals - 01/05/17 1331      PT LONG TERM GOAL #1   Title  independent with HEP     Status  New    [place] Date  02/16/17      PT LONG TERM GOAL #2   Title  demonstrate negative positional testing    Status  New    Target Date  02/16/17      PT LONG TERM GOAL #3   Title  balance to be assessed and goals to be written PRN    Status  New    Target Date  02/16/17      PT LONG TERM GOAL #4   Title  n/a      PT LONG TERM GOAL #5   Title  n/a             Plan - 01/05/17 1326    Clinical Impression Statement  Pt is a 61 y/o female who presents to OPPT for recent onset of vertigo approx. 1 month ago.  Positional testing demonstrated Rt horizontal canalithiasis tx with 1 rep of Rt barbeque roll, and with mild symptoms in Rt sidelying test indicating possible Rt pBPPV.  Pt will benefit from PT to address deficits.      History and Personal Factors relevant to plan of care:  neuropathy 2/2 chemo, depression, anxiety, hx of colon cancer    Clinical Presentation  Stable    Clinical Decision Making  Low    Rehab Potential  Good    PT Frequency  1x / week will see up to 2x/wk if needed    PT Duration  6 weeks    PT Treatment/Interventions  ADLs/Self Care Home Management;Canalith Repostioning;Electrical Stimulation;Cryotherapy;Moist Heat;Therapeutic exercise;Therapeutic activities;Functional mobility training;Stair training;Gait training;Balance training;Neuromuscular re-education;Patient/family education;Vestibular    PT Next Visit Plan  reassess and tx PRN, perform FGA if  indicated    Consulted and Agree with Plan of Care  Patient       Patient will benefit from skilled therapeutic intervention in order to improve the following deficits and impairments:  Dizziness, Decreased balance  Visit Diagnosis: BPPV (benign paroxysmal positional vertigo), right - Plan: PT plan of care cert/re-cert  Dizziness and giddiness - Plan: PT plan of care cert/re-cert  Unsteadiness on feet - Plan: PT plan of care cert/re-cert     Problem List Patient Active Problem List   Diagnosis Date Noted  . Paresthesia 05/12/2016  . Severe recurrent major depression without psychotic features ([place])   . Suicidal ideation   . Chronic cholecystitis with calculus 09/27/2011  . Hx of colon cancer, stage II 08/24/2011  . Hyperlipidemia 08/24/2011  . Anxiety and depression 08/24/2011      Laureen Abrahams, PT, DPT 01/05/17 1:35 PM    [facility] [address] [place], [place],  [address] Phone: [phone]   Fax:  [phone]  Name: Lorraine May MRN: [id number] Date of Birth: February 12, 1955

## 2017-01-07 ENCOUNTER — Other Ambulatory Visit: Payer: Self-pay | Admitting: Obstetrics and Gynecology

## 2017-01-07 DIAGNOSIS — Z1231 Encounter for screening mammogram for malignant neoplasm of breast: Secondary | ICD-10-CM

## 2017-01-14 ENCOUNTER — Encounter: Payer: Self-pay | Admitting: Physical Therapy

## 2017-01-14 ENCOUNTER — Ambulatory Visit: Payer: BLUE CROSS/BLUE SHIELD | Admitting: Physical Therapy

## 2017-01-14 DIAGNOSIS — H8111 Benign paroxysmal vertigo, right ear: Secondary | ICD-10-CM | POA: Diagnosis not present

## 2017-01-14 DIAGNOSIS — R42 Dizziness and giddiness: Secondary | ICD-10-CM

## 2017-01-14 DIAGNOSIS — R2681 Unsteadiness on feet: Secondary | ICD-10-CM

## 2017-01-14 NOTE — Therapy (Addendum)
[facility] [address] [place], [place], [address] Phone: [phone]   Fax:  [phone]  Physical Therapy Treatment/Discharge  Patient Details  Name: Lorraine May MRN: [id number] Date of Birth: 04-06-1955 Referring Provider: Dr. Sarina Ill   Encounter Date: 01/14/2017  PT End of Session - 01/14/17 1054    Visit Number  2    Number of Visits  6    Date for PT Re-Evaluation  02/16/17    Authorization Type  BCBS 40% coinsurance; 30 visit limit    PT Start Time  1016    PT Stop Time  1043    PT Time Calculation (min)  27 min    Activity Tolerance  Patient tolerated treatment well    Behavior During Therapy  [facility] for tasks assessed/performed       Past Medical History:  Diagnosis Date  . Anxiety   . Anxiety and depression 08/24/2011  . Arthritis   . Cancer ([place])    colon  . Depression   . Hx of colon cancer, stage II 08/24/2011   T3N0  adenoca cecum resected 04/2004  Xeloda adjuvant chemo  . Hyperlipidemia 08/24/2011  . Joint pain   . Nausea & vomiting   . Neuropathy   . PONV (postoperative nausea and vomiting)     Past Surgical History:  Procedure Laterality Date  . ABDOMINAL HYSTERECTOMY  2004  . BREAST SURGERY  2011   reduction  . CHOLECYSTECTOMY  10/05/2011   Procedure: LAPAROSCOPIC CHOLECYSTECTOMY WITH INTRAOPERATIVE CHOLANGIOGRAM;  Surgeon: Stark Klein, MD;  Location: [facility];  Service: General;  Laterality: N/A;  . [place]  . HEMICOLECTOMY Right 2006  . HERNIA REPAIR  2007  . KNEE ARTHROSCOPY  2010  . NASAL SEPTUM SURGERY  1983  . OTHER SURGICAL HISTORY  2016   Transcranial magnetic stimulation  . PORT-A-CATH REMOVAL  2006   insertion and removal  . STRABISMUS SURGERY  1962    There were no vitals filed for this visit.  Subjective Assessment - 01/14/17 1018    Subjective  no longer getting spinning, now feels like she's getting lightheaded at times: horizontal head  motions at computer and bringing head up when drying hair    Patient Stated Goals  improve dizziness    Currently in Pain?  No/denies             Vestibular Assessment - 01/14/17 1019      Positional Testing   Sidelying Test  Sidelying Right;Sidelying Left    Horizontal Canal Testing  Horizontal Canal Right;Horizontal Canal Left      Sidelying Right   Sidelying Right Duration  none; c/o mild lightheadedness    Sidelying Right Symptoms  No nystagmus      Sidelying Left   Sidelying Left Duration  none    Sidelying Left Symptoms  No nystagmus      Horizontal Canal Right   Horizontal Canal Right Duration  none    Horizontal Canal Right Symptoms  Normal      Horizontal Canal Left   Horizontal Canal Left Duration  none    Horizontal Canal Left Symptoms  Normal               Vestibular Treatment/Exercise - 01/14/17 1026      Vestibular Treatment/Exercise   Vestibular Treatment Provided  Gaze    Habituation Exercises  Seated Vertical Head Turns    Gaze Exercises  X1 Viewing Horizontal;X1 Viewing Vertical  Seated Vertical Head Turns   Number of Reps   5    Symptom Description   improvement in symptoms; performed simulated picking objects off floor      X1 Viewing Horizontal   Foot Position  feet apart    Time  -- 30 sec    Reps  1    Comments  increase in symptoms      X1 Viewing Vertical   Foot Position  feet apart    Time  -- 30 sec    Reps  1    Comments  no significant increase in symptoms            PT Education - 01/14/17 1053    Education provided  Yes    Education Details  d/c rolling; new HEP    Person(s) Educated  Patient    Methods  Explanation;Demonstration;Handout    Comprehension  Verbalized understanding;Returned demonstration          PT Long Term Goals - 01/14/17 1054      PT LONG TERM GOAL #1   Title  independent with HEP     Status  On-going      PT LONG TERM GOAL #2   Title  demonstrate negative positional  testing    Status  Achieved      PT LONG TERM GOAL #3   Title  balance to be assessed and goals to be written PRN    Status  On-going            Plan - 01/14/17 1054    Clinical Impression Statement  Pt demonstrated negative positional testing today indicating resolve of BPPV.  Still has some lingering symptoms with functional tasks so added to HEP today to address these.  Progressing well with PT.    PT Treatment/Interventions  ADLs/Self Care Home Management;Canalith Repostioning;Electrical Stimulation;Cryotherapy;Moist Heat;Therapeutic exercise;Therapeutic activities;Functional mobility training;Stair training;Gait training;Balance training;Neuromuscular re-education;Patient/family education;Vestibular    PT Next Visit Plan  reassess and tx PRN, perform FGA if indicated    Consulted and Agree with Plan of Care  Patient       Patient will benefit from skilled therapeutic intervention in order to improve the following deficits and impairments:  Dizziness, Decreased balance  Visit Diagnosis: BPPV (benign paroxysmal positional vertigo), right  Dizziness and giddiness  Unsteadiness on feet     Problem List Patient Active Problem List   Diagnosis Date Noted  . Paresthesia 05/12/2016  . Severe recurrent major depression without psychotic features ([place])   . Suicidal ideation   . Chronic cholecystitis with calculus 09/27/2011  . Hx of colon cancer, stage II 08/24/2011  . Hyperlipidemia 08/24/2011  . Anxiety and depression 08/24/2011      Laureen Abrahams, PT, DPT 01/14/17 10:56 AM    [facility] [address] [place], [place], [address] Phone: [phone]   Fax:  [phone]  Name: Lorraine May MRN: [id number] Date of Birth: 01/20/1956      PHYSICAL THERAPY DISCHARGE SUMMARY  Visits from Start of Care: 2  Current functional level related to goals / functional outcomes: See above    Remaining deficits: Unknown as pt didn't return; but canceled as symptoms improved   Education / Equipment: HEP  Plan: Patient agrees to discharge.  Patient goals were not met. Patient is being discharged due to the patient's request.  ?????    Laureen Abrahams, PT, DPT 03/02/17 3:19 PM  [facility] [address]  [place], [place] [address]  [phone] (office) [phone] (fax)

## 2017-01-14 NOTE — Patient Instructions (Signed)
Gaze Stabilization: Standing Feet Apart    Feet apart, keeping eyes on target on wall 3-5 feet away, move head side to side for __30-60__ seconds. Repeat while moving head up and down for _30-60___ seconds. Do _2-3___ sessions per day.  Copyright  VHI. All rights reserved.   Gaze Stabilization: Tip Card  1.Target must remain in focus, not blurry, and appear stationary while head is in motion. 2.Perform exercises with small head movements (45 to either side of midline). 3.Increase speed of head motion so long as target is in focus. 4.If you wear eyeglasses, be sure you can see target through lens (therapist will give specific instructions for bifocal / progressive lenses). 5.These exercises may provoke dizziness or nausea. Work through these symptoms. If too dizzy, slow head movement slightly. Rest between each exercise. 6.Exercises demand concentration; avoid distractions. 7.For safety, perform standing exercises close to a counter, wall, corner, or next to someone.  Copyright  VHI. All rights reserved.    Bending / Picking Up Objects    Sitting, semi-quickly bend head down and pretend to pick up object on the floor. Return to upright position moving semi-quickly. Hold position until symptoms subside plus 20-30 seconds. Repeat __5_ times per session. Do __2-3__ sessions per day.  Copyright  VHI. All rights reserved.

## 2017-01-17 DIAGNOSIS — R6884 Jaw pain: Secondary | ICD-10-CM | POA: Diagnosis not present

## 2017-01-18 DIAGNOSIS — F606 Avoidant personality disorder: Secondary | ICD-10-CM | POA: Diagnosis not present

## 2017-01-18 DIAGNOSIS — F341 Dysthymic disorder: Secondary | ICD-10-CM | POA: Diagnosis not present

## 2017-01-18 DIAGNOSIS — F411 Generalized anxiety disorder: Secondary | ICD-10-CM | POA: Diagnosis not present

## 2017-01-18 DIAGNOSIS — F4310 Post-traumatic stress disorder, unspecified: Secondary | ICD-10-CM | POA: Diagnosis not present

## 2017-01-21 ENCOUNTER — Encounter: Payer: Self-pay | Admitting: Physical Therapy

## 2017-01-31 DIAGNOSIS — E639 Nutritional deficiency, unspecified: Secondary | ICD-10-CM | POA: Diagnosis not present

## 2017-01-31 DIAGNOSIS — E559 Vitamin D deficiency, unspecified: Secondary | ICD-10-CM | POA: Diagnosis not present

## 2017-01-31 DIAGNOSIS — F419 Anxiety disorder, unspecified: Secondary | ICD-10-CM | POA: Diagnosis not present

## 2017-01-31 DIAGNOSIS — R197 Diarrhea, unspecified: Secondary | ICD-10-CM | POA: Diagnosis not present

## 2017-01-31 DIAGNOSIS — G5793 Unspecified mononeuropathy of bilateral lower limbs: Secondary | ICD-10-CM | POA: Diagnosis not present

## 2017-02-02 ENCOUNTER — Encounter: Payer: Self-pay | Admitting: Neurology

## 2017-02-02 ENCOUNTER — Ambulatory Visit (INDEPENDENT_AMBULATORY_CARE_PROVIDER_SITE_OTHER): Payer: BLUE CROSS/BLUE SHIELD | Admitting: Neurology

## 2017-02-02 VITALS — BP 104/67 | HR 62 | Ht 64.5 in | Wt 166.0 lb

## 2017-02-02 DIAGNOSIS — G1221 Amyotrophic lateral sclerosis: Secondary | ICD-10-CM

## 2017-02-02 DIAGNOSIS — I7774 Dissection of vertebral artery: Secondary | ICD-10-CM

## 2017-02-02 DIAGNOSIS — R51 Headache: Secondary | ICD-10-CM | POA: Diagnosis not present

## 2017-02-02 DIAGNOSIS — I670 Dissection of cerebral arteries, nonruptured: Secondary | ICD-10-CM | POA: Diagnosis not present

## 2017-02-02 DIAGNOSIS — R519 Headache, unspecified: Secondary | ICD-10-CM

## 2017-02-02 DIAGNOSIS — R252 Cramp and spasm: Secondary | ICD-10-CM

## 2017-02-02 DIAGNOSIS — R42 Dizziness and giddiness: Secondary | ICD-10-CM | POA: Diagnosis not present

## 2017-02-02 DIAGNOSIS — G1229 Other motor neuron disease: Secondary | ICD-10-CM

## 2017-02-02 MED ORDER — ALPRAZOLAM 0.5 MG PO TABS: ORAL_TABLET | ORAL | 0 refills | Status: DC

## 2017-02-02 NOTE — Patient Instructions (Addendum)
[facility] at [facility] MRI brain and MRA head and neck   Benign Positional Vertigo Vertigo is the feeling that you or your surroundings are moving when they are not. Benign positional vertigo is the most common form of vertigo. The cause of this condition is not serious (is benign). This condition is triggered by certain movements and positions (is positional). This condition can be dangerous if it occurs while you are doing something that could endanger you or others, such as driving. What are the causes? In many cases, the cause of this condition is not known. It may be caused by a disturbance in an area of the inner ear that helps your brain to sense movement and balance. This disturbance can be caused by a viral infection (labyrinthitis), head injury, or repetitive motion. What increases the risk? This condition is more likely to develop in:  Women.  People who are 31 years of age or older.  What are the signs or symptoms? Symptoms of this condition usually happen when you move your head or your eyes in different directions. Symptoms may start suddenly, and they usually last for less than a minute. Symptoms may include:  Loss of balance and falling.  Feeling like you are spinning or moving.  Feeling like your surroundings are spinning or moving.  Nausea and vomiting.  Blurred vision.  Dizziness.  Involuntary eye movement (nystagmus).  Symptoms can be mild and cause only slight annoyance, or they can be severe and interfere with daily life. Episodes of benign positional vertigo may return (recur) over time, and they may be triggered by certain movements. Symptoms may improve over time. How is this diagnosed? This condition is usually diagnosed by medical history and a physical exam of the head, neck, and ears. You may be referred to a health care provider who specializes in ear, nose, and throat (ENT) problems (otolaryngologist) or a provider who specializes in  disorders of the nervous system (neurologist). You may have additional testing, including:  MRI.  A CT scan.  Eye movement tests. Your health care provider may ask you to change positions quickly while he or she watches you for symptoms of benign positional vertigo, such as nystagmus. Eye movement may be tested with an electronystagmogram (ENG), caloric stimulation, the Dix-Hallpike test, or the roll test.  An electroencephalogram (EEG). This records electrical activity in your brain.  Hearing tests.  How is this treated? Usually, your health care provider will treat this by moving your head in specific positions to adjust your inner ear back to normal. Surgery may be needed in severe cases, but this is rare. In some cases, benign positional vertigo may resolve on its own in 2-4 weeks. Follow these instructions at home: Safety  Move slowly.Avoid sudden body or head movements.  Avoid driving.  Avoid operating heavy machinery.  Avoid doing any tasks that would be dangerous to you or others if a vertigo episode would occur.  If you have trouble walking or keeping your balance, try using a cane for stability. If you feel dizzy or unstable, sit down right away.  Return to your normal activities as told by your health care provider. Ask your health care provider what activities are safe for you. General instructions  Take over-the-counter and prescription medicines only as told by your health care provider.  Avoid certain positions or movements as told by your health care provider.  Drink enough fluid to keep your urine clear or pale yellow.  Keep all follow-up  visits as told by your health care provider. This is important. Contact a health care provider if:  You have a fever.  Your condition gets worse or you develop new symptoms.  Your family or friends notice any behavioral changes.  Your nausea or vomiting gets worse.  You have numbness or a "pins and needles"  sensation. Get help right away if:  You have difficulty speaking or moving.  You are always dizzy.  You faint.  You develop severe headaches.  You have weakness in your legs or arms.  You have changes in your hearing or vision.  You develop a stiff neck.  You develop sensitivity to light. This information is not intended to replace advice given to you by your health care provider. Make sure you discuss any questions you have with your health care provider. Document Released: 10/26/2005 Document Revised: 06/26/2015 Document Reviewed: 05/13/2014 Elsevier Interactive Patient Education  2018 [employer].   Vertebral Artery Dissection Vertebral artery dissection is a tear in a vertebral artery. The vertebral arteries are major blood vessels at the base of the neck. They carry blood from the heart to the brain. When an artery tears, blood collects inside the layers of the artery wall. This can cause a blood clot. This condition increases the risk of stroke if it is not diagnosed and treated right away. It is a common cause of stroke in people who are 2-65 years old. What are the causes? This condition may be caused by:  A neck injury due to sudden or excessive neck movement. The injury may be mild or severe.  Having weak blood vessel walls. The walls may tear even when no injury occurs (spontaneous dissection).  What increases the risk? You may be more likely to have a spontaneous vertebral artery dissection if you:  Have high blood pressure.  Have a migraine disorder.  Have certain inherited diseases or connective tissue disorders that weaken the blood vessels.  Have fibromuscular dysplasia.  What are the signs or symptoms? Symptoms usually appear within days of an injury, but sometimes they may not appear for weeks or years. Symptoms may include:  Stabbing, sharp pain in the head, neck, eye, or face.  Vertigo. This is a feeling that you or your surroundings are moving  when they are not.  Dizziness.  Double vision.  Difficulty speaking.  Difficulty swallowing.  Hoarse voice.  Loss of feeling in the torso, legs, or arms.  Loss of taste.  Loss of balance.  Hiccups.  Nausea and vomiting.  Hearing loss.  Ear pain.  How is this diagnosed? This condition is diagnosed with tests, such as:  CT angiogram. This test uses a computer to take X-rays of your vertebral arteries. A dye may be injected into your blood to show the inside of your blood vessels more clearly.  MRI angiogram. This is a type of MRI that is used to evaluate the blood vessels.  Cerebral angiogram. This test uses X-rays and a dye to show the blood vessels in the brain and neck.  Doppler ultrasound. This test uses sound waves to show the vertebral arteries. The test will also show how well blood flows through your arteries.  How is this treated? Treatment for this condition depends on the cause of your vertebral artery dissection and your overall health. The most important goal of treatment is to prevent a stroke. If you are having a stroke, it is important to get treatment quickly. Treatment may include:  Blood thinners. This  medicine helps to prevent blood clots. This may be given first through an IV tube, and then as pills for 3-6 months.  Procedures to widen a narrow blood vessel (angioplasty) or to place a mesh tube (stent) inside the blood vessel to keep it open.  Surgery to repair the area. This is rarely needed.  Follow these instructions at home:  Work with your health care provider to control your blood pressure. This may involve: ? Exercising regularly, as told by your health care provider. Check with your health care provider before starting any new type of exercise. ? Eating a heart-healthy diet. This includes limiting unhealthy fats and eating more healthy fats. ? Reducing the amount of salt (sodium) that you eat to less than 1,500 mg a day. ? Reducing stress  by participating in things that you enjoy and avoiding things that cause you stress.  Avoid activities that put you at risk for neck injuries, such as contact sports.  Take over-the-counter and prescription medicines only as told by your health care provider.  Do not use any tobacco products, such as cigarettes, chewing tobacco, and e-cigarettes. If you need help quitting, ask your health care provider.  Keep all follow-up visits as told by your health care provider. This is important. Get help right away if:  You feel weak or dizzy.  You have a sudden, severe headache with no known cause.  You notice changes in your vision or speech.  You have difficulty breathing.  You have a loss of balance or coordination.  You have numbness in your face, arm, or leg.  You have chest pain.  You have a fever. These symptoms may represent a serious problem that is an emergency. Do not wait to see if the symptoms will go away. Get medical help right away. Call your local emergency services (911 in the [place] Do not drive yourself to the hospital. This information is not intended to replace advice given to you by your health care provider. Make sure you discuss any questions you have with your health care provider. Document Released: 01/05/2012 Document Revised: 07/01/2015 Document Reviewed: 04/16/2015 Elsevier Interactive Patient Education  [employer].

## 2017-02-02 NOTE — Progress Notes (Signed)
[facility]    Provider:  Dr Jaynee Eagles Referring Provider: Darcus Austin, MD Primary Care Physician:  Darcus Austin, MD  CC: Neuropathy, unexplained spasticity, acute onset vertigo  Interval history 02/02/2017: Patient is here for follow up of vertigo.  She was on the massage table to the right she had spinning. She couldn't walk or keep balance. She still has vertigo. Happens when she moves her head. She gets massages all the time. It was gentle turning forceful and not an "adjustment".   But the vertigo was acute as soon as the massage therapist manipulated her head position. Discuss concern for vertebral dissection and stroke.   Interval history 07/02/2016: Patient is here for follow-up of biopsy proven small fiber neuropathy. Extensive testing has been unremarkable including SPEP, ANA, sedimentation rate, TSH, CMP, B12, vitamin D, hemoglobin A1c (per pcp notes and unremarkable) and here in our clinic: vitamin B1, CRP, Lyme, B6, heavy metals, rheumatoid factor,pan-anca, Sjogren's, angiotensin-converting enzyme, BMP. Will check b12 and hgba1c again.  She had chemotherapy with a platinum-based drug. She was tested for celiac disease in 2016 which was negative.   Interval history 03/03/2016: Here for a new problem today burning in the legs. She has new tingling in her feet. She saw a dermatologist for the rash and he gave her some topical steroids. The rash is better. The tingling is still there. She has swelling in her right foot. She has incontinence. Started a few months ago. Thought it was dry skin. Burning and tingling. The whole foot feels hot both of them. No inciting events. No new medications. Nothing new. Feels swollen behind the knees. Not bad at night, not worse at night. Symptoms are worsening, she is noticing it more. Continuous. No weakness. No change in back pain. No cramping, hands not involved. Nothing makes it better or worse, standing too long may make it worse.    Labs tested: spep, ANA, sed rate, TSH, CMP, B12, Vit D, hgba1c, urinalysis  Interval history: MRI of the brain and thoracic spine were unremarkable. A year ago she pushed a box with her foot, pain exploded up her leg, thought she pulled a muscle, she was diagnosed with bursitis, She saw orthopaedics and diagnosed with bursitis again, an injection made her whole leg go numb, She has hip and lower back pain. She has done physical therapy. [facility] [place] [facility]. She is stretching and strengthening. She has lower back pain with radiation down both legs. She has continued weakness.   MWN:UUVOZ G Vocciis a very nice 62 y.o.femalehere as a referral from Dr. Val Eagle chronic headaches. PMHx major depression, generalized anxiety, headaches, PTSD, Stage 3 kidney disease, Osteopenia, colon caner s/p right hemicolectomy 2006. She has headaches for a year, no inciting events or head trauma but she was trying all kinds "loads" of medications for her mood disorder. Never had headaches int he past or any history of migraines. She has multiple types of headaches, occasionally will get ice pick in the right temporal area which occurs every several weeks and is brief, no eye watering or nose running but very painful, no time of day preference. She has a headache right now on the top of her head and behind the eyes, she saw an ophthalmologist who sent her stat for temporal arteritis but labwork was negative. 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 The headache is a pressure headache, more pressure and pain, consistent and continuous over 4-5 hours and a few tylenol  help, she takes tylenol 3-4 times a week. She had TMS therapy for depression last year and this resulted in headaches. Headaches most days of the week,she wakes up with headaches. She snores mildly. She had an MRI of the brain but may have started after the MRI last April. The clonidine was started about the same time as the headaches.  She has no significant daytime somnolence and no other signs of obstructive sleep apnea. No other focal neurologic deficits or complaints. No double vision, no aphasia, no dysarthria, no weakness, no sensory changes. No bowel or bladder changes. No fever or systemic symptoms. She also has a lot of tightness in the neck associated with headaches.  Reviewed notes, labs and imaging from outside physicians, which showed: Patient brings in a CD with the images, personally reviewed. MRI of the cervical spine was largely unremarkable, at C4-C5 minimal central canal and moderate right foraminal stenosis. At C5-C6 minimal central canal and moderate right foraminal stenosis.  Patient was referred by [facility] orthopedics. She is seeing them for neck pain. Neck Stiffness and headaches. Symptoms exacerbated by turning the head to the right and turning the head to the left. Treated with non-opioid analgesics. Patient also reported low back pain. Mild neck discomfort, worse with range of motion and deep palpation. Exam significant for 10 beats of clonus in the lower extremities bilaterally, 3+ brisk deep tendon reflexes at the knees, Achilles, biceps, triceps and brachial radialis. Toes are flexor. Negative Hoffman sign. Normal gait pattern. Slight limp when she walks because of right-sided gluteal and lateral hip pain that she is not ataxic and she has no significant imbalance. Negative Romberg. MRI reviewed showed no cord signal changes. Mild multilevel degenerative disease no significant high-grade neural compression. No evidence to suggest cervical spondylitic myelopathy.  BMP unremarkable creatinine slightly elevated 1.02, CBC normal     Social History   Socioeconomic History  . Marital status: Married    Spouse name: Herbie Baltimore  . Number of children: 1  . Years of education: 20  . Highest education level: Not on file  Social Needs  . Financial resource strain: Not on file  . Food insecurity - worry: Not  on file  . Food insecurity - inability: Not on file  . Transportation needs - medical: Not on file  . Transportation needs - non-medical: Not on file  Occupational History  . Occupation: Press photographer- Retired  Tobacco Use  . Smoking status: Never Smoker  . Smokeless tobacco: Never Used  Substance and Sexual Activity  . Alcohol use: Yes    Comment: occ  . Drug use: No  . Sexual activity: Not on file  Other Topics Concern  . Not on file  Social History Narrative   Lives with husband   Caffeine use: 1 cup daily   Right handed    Family History  Problem Relation Age of Onset  . Diabetes Mother        type 1  . Stroke Mother   . Dementia Mother   . Thyroid disease Mother   . Alcohol abuse Mother   . Asthma Mother   . Alcohol abuse Father   . Diabetes Father   . Cancer Paternal Aunt        germ cell  . Cancer Maternal Grandmother        colon  . Cancer Maternal Uncle        throat  . Thyroid disease Sister   . Alcohol abuse Brother   . Psychiatric  Illness Brother   . Psychiatric Illness Sister   . Asthma Sister   . Neuropathy Neg Hx     Past Medical History:  Diagnosis Date  . Anxiety   . Anxiety and depression 08/24/2011  . Arthritis   . Cancer ([place])    colon  . Depression   . Hx of colon cancer, stage II 08/24/2011   T3N0  adenoca cecum resected 04/2004  Xeloda adjuvant chemo  . Hyperlipidemia 08/24/2011  . Joint pain   . Nausea & vomiting   . Neuropathy   . PONV (postoperative nausea and vomiting)     Past Surgical History:  Procedure Laterality Date  . ABDOMINAL HYSTERECTOMY  2004  . BREAST SURGERY  2011   reduction  . CHOLECYSTECTOMY  10/05/2011   Procedure: LAPAROSCOPIC CHOLECYSTECTOMY WITH INTRAOPERATIVE CHOLANGIOGRAM;  Surgeon: Stark Klein, MD;  Location: [facility];  Service: General;  Laterality: N/A;  . [place]  . HEMICOLECTOMY Right 2006  . HERNIA REPAIR  2007  . KNEE ARTHROSCOPY  2010  . NASAL SEPTUM SURGERY  1983  . OTHER  SURGICAL HISTORY  2016   Transcranial magnetic stimulation  . PORT-A-CATH REMOVAL  2006   insertion and removal  . STRABISMUS SURGERY  1962    Current Outpatient Medications  Medication Sig Dispense Refill  . busPIRone (BUSPAR) 30 MG tablet   0  . colestipol (COLESTID) 1 g tablet   0  . dicyclomine (BENTYL) 20 MG tablet Take 20 mg by mouth as needed for spasms.     Marland Kitchen gabapentin (NEURONTIN) 300 MG capsule Take 300 mg by mouth 2 (two) times daily. Pt also takes 600 mg at bedtime  1  . lamoTRIgine (LAMICTAL) 100 MG tablet Take 200 mg by mouth daily.    . valACYclovir (VALTREX) 500 MG tablet Take 4 tablets by mouth. Take 4 tablets and then another 4 tablets 12 hours later as needed for fever blister  0  . ALPRAZolam (XANAX) 0.5 MG tablet Take 1-2 tablets 30-60 minutes before MRI. May repeat if needed 6 tablet 0   No current facility-administered medications for this visit.     Allergies as of 02/02/2017 - Review Complete 02/02/2017  Allergen Reaction Noted  . Demeclocycline Nausea Only 08/24/2011  . Dilaudid [hydromorphone hcl] Itching 08/24/2011  . Fentanyl Itching 08/22/2012  . Morphine and related Itching 08/24/2011  . Nsaids  08/13/2016  . Percocet [oxycodone-acetaminophen]  08/21/2013  . Tetracyclines & related Nausea Only 08/24/2011  . Sulfa antibiotics Rash 08/24/2011    Vitals: BP 104/67 (BP Location: Left Arm, Patient Position: Sitting)   Pulse 62   Ht 5' 4.5" (1.638 m)   Wt 166 lb (75.3 kg)   BMI 28.05 kg/m  Last Weight:  Wt Readings from Last 1 Encounters:  02/02/17 166 lb (75.3 kg)   Last Height:   Ht Readings from Last 1 Encounters:  02/02/17 5' 4.5" (1.638 m)    Strength intact. No significant decrease in any sensory modality distally in the extremities. Gait normal. Brisk reflexes with clonus in the lower extremities, toes downgoing.   Assessment/Plan:Very nice 62 y.o. female here as a referral from Dr. Rolena Infante for chronic headaches and low back pain.  PMHx major depression, generalized anxiety, headaches, PTSD, Stage 3 kidney disease, Osteopenia, colon caner s/p right hemicolectomy 2006. Patient also has some interesting physical findings including bilateral lower extremity clonus at the patellars and the ankle jerks but imaging of the brain, cervical and thoracic spine unremarkable  for [facility] disease may be genetic or neurodegenerative.  Vertigo during a massage with therapist moving her head to the right, vertigo continues, need MRI of the brain and MRA of the head and neck looking for dissection and stroke.   Spasticity and clonus with negative MRI of the neuro axis: May be degenerative or genetic. Will refer to [facility]romuscular [facility] at [facility].   She has biopsy proven small-fiber neuropathy, discussed findings and reviewed biopsy results,  most significant risk factor found so far is previous platinum-based chemotherapy. Had a long discussion about small-fiber neuropathy, causes and progression. Discussed the causes of peripheral neuropathy, the most common being diabetes which patient does not report having. About 20 million people in the [place] states have some form of peripheral neuropathy. This is a condition that develops as a result of damage to the peripheral nervous system.  There are multiple causes eluding metabolic, toxic, infectious and endocrine disorders, small vessel disease, autoimmune diseases, and others. We have  performed extensive serum neuropathy screening as well as an EMG nerve conduction study.   Orders Placed This Encounter  Procedures  . MR BRAIN WO CONTRAST  . MR MRA HEAD WO CONTRAST  . MR MRA NECK W WO CONTRAST  . Basic Metabolic Panel  . Ambulatory referral to Neurology     Sarina Ill, MD  [facility] [address] [place], [place] [address]  Phone [phone] Fax [phone]

## 2017-02-03 ENCOUNTER — Telehealth: Payer: Self-pay | Admitting: Neurology

## 2017-02-03 DIAGNOSIS — F606 Avoidant personality disorder: Secondary | ICD-10-CM | POA: Diagnosis not present

## 2017-02-03 DIAGNOSIS — R252 Cramp and spasm: Secondary | ICD-10-CM | POA: Insufficient documentation

## 2017-02-03 DIAGNOSIS — F341 Dysthymic disorder: Secondary | ICD-10-CM | POA: Diagnosis not present

## 2017-02-03 DIAGNOSIS — F411 Generalized anxiety disorder: Secondary | ICD-10-CM | POA: Diagnosis not present

## 2017-02-03 DIAGNOSIS — F4310 Post-traumatic stress disorder, unspecified: Secondary | ICD-10-CM | POA: Diagnosis not present

## 2017-02-03 LAB — BASIC METABOLIC PANEL
BUN/Creatinine Ratio: 16 (ref 12–28)
BUN: 19 mg/dL (ref 8–27)
CO2: 24 mmol/L (ref 20–29)
Calcium: 9.3 mg/dL (ref 8.7–10.3)
Chloride: 102 mmol/L (ref 96–106)
Creatinine, Ser: 1.17 mg/dL — ABNORMAL HIGH (ref 0.57–1.00)
GFR calc Af Amer: 57 mL/min/{1.73_m2} — ABNORMAL LOW (ref >59)
GFR calc non Af Amer: 50 mL/min/{1.73_m2} — ABNORMAL LOW (ref >59)
Glucose: 97 mg/dL (ref 65–99)
Potassium: 5.2 mmol/L (ref 3.5–5.2)
Sodium: 140 mmol/L (ref 134–144)

## 2017-02-03 NOTE — Telephone Encounter (Signed)
-----   Message from Melvenia Beam, MD sent at 02/03/2017  7:38 AM EST ----- Labs stable, thanks

## 2017-02-03 NOTE — Telephone Encounter (Signed)
Called the patient and made her aware of the normal labs. Pt verbalized understanding.

## 2017-02-08 DIAGNOSIS — F411 Generalized anxiety disorder: Secondary | ICD-10-CM | POA: Diagnosis not present

## 2017-02-08 DIAGNOSIS — F4310 Post-traumatic stress disorder, unspecified: Secondary | ICD-10-CM | POA: Diagnosis not present

## 2017-02-08 DIAGNOSIS — F341 Dysthymic disorder: Secondary | ICD-10-CM | POA: Diagnosis not present

## 2017-02-08 DIAGNOSIS — F606 Avoidant personality disorder: Secondary | ICD-10-CM | POA: Diagnosis not present

## 2017-02-13 ENCOUNTER — Other Ambulatory Visit: Payer: Self-pay

## 2017-02-14 ENCOUNTER — Ambulatory Visit: Payer: Self-pay

## 2017-02-15 ENCOUNTER — Ambulatory Visit
Admission: RE | Admit: 2017-02-15 | Discharge: 2017-02-15 | Disposition: A | Discharge status: Home / Self Care | Payer: BLUE CROSS/BLUE SHIELD | Source: Ambulatory Visit | Attending: Neurology | Admitting: Neurology

## 2017-02-15 DIAGNOSIS — F4310 Post-traumatic stress disorder, unspecified: Secondary | ICD-10-CM | POA: Diagnosis not present

## 2017-02-15 DIAGNOSIS — F411 Generalized anxiety disorder: Secondary | ICD-10-CM | POA: Diagnosis not present

## 2017-02-15 DIAGNOSIS — R42 Dizziness and giddiness: Secondary | ICD-10-CM

## 2017-02-15 DIAGNOSIS — I7774 Dissection of vertebral artery: Secondary | ICD-10-CM

## 2017-02-15 DIAGNOSIS — I670 Dissection of cerebral arteries, nonruptured: Secondary | ICD-10-CM

## 2017-02-15 DIAGNOSIS — F341 Dysthymic disorder: Secondary | ICD-10-CM | POA: Diagnosis not present

## 2017-02-15 DIAGNOSIS — F606 Avoidant personality disorder: Secondary | ICD-10-CM | POA: Diagnosis not present

## 2017-02-15 DIAGNOSIS — R519 Headache, unspecified: Secondary | ICD-10-CM

## 2017-02-15 DIAGNOSIS — R51 Headache: Secondary | ICD-10-CM | POA: Diagnosis not present

## 2017-02-15 MED ORDER — GADOBENATE DIMEGLUMINE 529 MG/ML IV SOLN
15.0000 mL | Freq: Once | INTRAVENOUS | Status: AC | PRN
  Administered 2017-02-15: 15 mL via INTRAVENOUS

## 2017-02-16 ENCOUNTER — Other Ambulatory Visit: Payer: Self-pay

## 2017-02-17 ENCOUNTER — Telehealth: Payer: Self-pay | Admitting: *Deleted

## 2017-02-17 NOTE — Telephone Encounter (Addendum)
Called patient and LVM (ok per DPR) informing her that her MR MRA Neck, MR Brain, MR MRA Head are all three normal for age per Dr. Jaynee Eagles. I informed patient that a return call is not required but encouraged patient to call with any questions. Left office number in message.    ----- Message from Melvenia Beam, MD sent at 02/17/2017 10:36 AM EST ----- Normal for age    MR MRA Neck, MR Brain, MR MRA Head all three normal for age per Dr. Jaynee Eagles.

## 2017-02-22 DIAGNOSIS — F4310 Post-traumatic stress disorder, unspecified: Secondary | ICD-10-CM | POA: Diagnosis not present

## 2017-02-22 DIAGNOSIS — F606 Avoidant personality disorder: Secondary | ICD-10-CM | POA: Diagnosis not present

## 2017-02-22 DIAGNOSIS — F411 Generalized anxiety disorder: Secondary | ICD-10-CM | POA: Diagnosis not present

## 2017-02-22 DIAGNOSIS — F341 Dysthymic disorder: Secondary | ICD-10-CM | POA: Diagnosis not present

## 2017-03-01 DIAGNOSIS — F411 Generalized anxiety disorder: Secondary | ICD-10-CM | POA: Diagnosis not present

## 2017-03-01 DIAGNOSIS — F606 Avoidant personality disorder: Secondary | ICD-10-CM | POA: Diagnosis not present

## 2017-03-01 DIAGNOSIS — F4310 Post-traumatic stress disorder, unspecified: Secondary | ICD-10-CM | POA: Diagnosis not present

## 2017-03-01 DIAGNOSIS — F341 Dysthymic disorder: Secondary | ICD-10-CM | POA: Diagnosis not present

## 2017-03-04 DIAGNOSIS — G5793 Unspecified mononeuropathy of bilateral lower limbs: Secondary | ICD-10-CM | POA: Diagnosis not present

## 2017-03-04 DIAGNOSIS — R197 Diarrhea, unspecified: Secondary | ICD-10-CM | POA: Diagnosis not present

## 2017-03-04 DIAGNOSIS — Z85038 Personal history of other malignant neoplasm of large intestine: Secondary | ICD-10-CM | POA: Diagnosis not present

## 2017-03-04 DIAGNOSIS — Z9049 Acquired absence of other specified parts of digestive tract: Secondary | ICD-10-CM | POA: Diagnosis not present

## 2017-03-07 ENCOUNTER — Ambulatory Visit
Admission: RE | Admit: 2017-03-07 | Discharge: 2017-03-07 | Disposition: A | Discharge status: Home / Self Care | Payer: BLUE CROSS/BLUE SHIELD | Source: Ambulatory Visit | Attending: Obstetrics and Gynecology | Admitting: Obstetrics and Gynecology

## 2017-03-07 DIAGNOSIS — Z1231 Encounter for screening mammogram for malignant neoplasm of breast: Secondary | ICD-10-CM | POA: Diagnosis not present

## 2017-03-07 DIAGNOSIS — F606 Avoidant personality disorder: Secondary | ICD-10-CM | POA: Diagnosis not present

## 2017-03-07 DIAGNOSIS — F341 Dysthymic disorder: Secondary | ICD-10-CM | POA: Diagnosis not present

## 2017-03-07 DIAGNOSIS — F411 Generalized anxiety disorder: Secondary | ICD-10-CM | POA: Diagnosis not present

## 2017-03-07 DIAGNOSIS — F4310 Post-traumatic stress disorder, unspecified: Secondary | ICD-10-CM | POA: Diagnosis not present

## 2017-03-15 DIAGNOSIS — F606 Avoidant personality disorder: Secondary | ICD-10-CM | POA: Diagnosis not present

## 2017-03-15 DIAGNOSIS — F4310 Post-traumatic stress disorder, unspecified: Secondary | ICD-10-CM | POA: Diagnosis not present

## 2017-03-15 DIAGNOSIS — F341 Dysthymic disorder: Secondary | ICD-10-CM | POA: Diagnosis not present

## 2017-03-15 DIAGNOSIS — F411 Generalized anxiety disorder: Secondary | ICD-10-CM | POA: Diagnosis not present

## 2017-03-23 DIAGNOSIS — F332 Major depressive disorder, recurrent severe without psychotic features: Secondary | ICD-10-CM | POA: Diagnosis not present

## 2017-03-29 DIAGNOSIS — F4310 Post-traumatic stress disorder, unspecified: Secondary | ICD-10-CM | POA: Diagnosis not present

## 2017-03-29 DIAGNOSIS — F606 Avoidant personality disorder: Secondary | ICD-10-CM | POA: Diagnosis not present

## 2017-03-29 DIAGNOSIS — F341 Dysthymic disorder: Secondary | ICD-10-CM | POA: Diagnosis not present

## 2017-03-29 DIAGNOSIS — F411 Generalized anxiety disorder: Secondary | ICD-10-CM | POA: Diagnosis not present

## 2017-04-05 DIAGNOSIS — F411 Generalized anxiety disorder: Secondary | ICD-10-CM | POA: Diagnosis not present

## 2017-04-05 DIAGNOSIS — F4310 Post-traumatic stress disorder, unspecified: Secondary | ICD-10-CM | POA: Diagnosis not present

## 2017-04-05 DIAGNOSIS — F606 Avoidant personality disorder: Secondary | ICD-10-CM | POA: Diagnosis not present

## 2017-04-05 DIAGNOSIS — F341 Dysthymic disorder: Secondary | ICD-10-CM | POA: Diagnosis not present

## 2017-04-06 DIAGNOSIS — Z713 Dietary counseling and surveillance: Secondary | ICD-10-CM | POA: Diagnosis not present

## 2017-04-12 DIAGNOSIS — F606 Avoidant personality disorder: Secondary | ICD-10-CM | POA: Diagnosis not present

## 2017-04-12 DIAGNOSIS — F411 Generalized anxiety disorder: Secondary | ICD-10-CM | POA: Diagnosis not present

## 2017-04-12 DIAGNOSIS — F341 Dysthymic disorder: Secondary | ICD-10-CM | POA: Diagnosis not present

## 2017-04-12 DIAGNOSIS — F4310 Post-traumatic stress disorder, unspecified: Secondary | ICD-10-CM | POA: Diagnosis not present

## 2017-04-13 DIAGNOSIS — R6884 Jaw pain: Secondary | ICD-10-CM | POA: Diagnosis not present

## 2017-04-16 ENCOUNTER — Other Ambulatory Visit: Payer: Self-pay | Admitting: Neurology

## 2017-04-26 DIAGNOSIS — F606 Avoidant personality disorder: Secondary | ICD-10-CM | POA: Diagnosis not present

## 2017-04-26 DIAGNOSIS — F4310 Post-traumatic stress disorder, unspecified: Secondary | ICD-10-CM | POA: Diagnosis not present

## 2017-04-26 DIAGNOSIS — F341 Dysthymic disorder: Secondary | ICD-10-CM | POA: Diagnosis not present

## 2017-04-26 DIAGNOSIS — F411 Generalized anxiety disorder: Secondary | ICD-10-CM | POA: Diagnosis not present

## 2017-05-02 DIAGNOSIS — E538 Deficiency of other specified B group vitamins: Secondary | ICD-10-CM | POA: Diagnosis not present

## 2017-05-02 DIAGNOSIS — N183 Chronic kidney disease, stage 3 (moderate): Secondary | ICD-10-CM | POA: Diagnosis not present

## 2017-05-02 DIAGNOSIS — Z0000 Encounter for general adult medical examination without abnormal findings: Secondary | ICD-10-CM | POA: Diagnosis not present

## 2017-05-02 DIAGNOSIS — E559 Vitamin D deficiency, unspecified: Secondary | ICD-10-CM | POA: Diagnosis not present

## 2017-05-02 DIAGNOSIS — E7800 Pure hypercholesterolemia, unspecified: Secondary | ICD-10-CM | POA: Diagnosis not present

## 2017-05-03 DIAGNOSIS — F411 Generalized anxiety disorder: Secondary | ICD-10-CM | POA: Diagnosis not present

## 2017-05-03 DIAGNOSIS — F4310 Post-traumatic stress disorder, unspecified: Secondary | ICD-10-CM | POA: Diagnosis not present

## 2017-05-03 DIAGNOSIS — F606 Avoidant personality disorder: Secondary | ICD-10-CM | POA: Diagnosis not present

## 2017-05-03 DIAGNOSIS — F341 Dysthymic disorder: Secondary | ICD-10-CM | POA: Diagnosis not present

## 2017-05-09 ENCOUNTER — Other Ambulatory Visit: Payer: Self-pay | Admitting: Dermatology

## 2017-05-09 DIAGNOSIS — D485 Neoplasm of uncertain behavior of skin: Secondary | ICD-10-CM | POA: Diagnosis not present

## 2017-05-09 DIAGNOSIS — D229 Melanocytic nevi, unspecified: Secondary | ICD-10-CM | POA: Diagnosis not present

## 2017-05-10 DIAGNOSIS — F606 Avoidant personality disorder: Secondary | ICD-10-CM | POA: Diagnosis not present

## 2017-05-10 DIAGNOSIS — F411 Generalized anxiety disorder: Secondary | ICD-10-CM | POA: Diagnosis not present

## 2017-05-10 DIAGNOSIS — F4310 Post-traumatic stress disorder, unspecified: Secondary | ICD-10-CM | POA: Diagnosis not present

## 2017-05-10 DIAGNOSIS — F341 Dysthymic disorder: Secondary | ICD-10-CM | POA: Diagnosis not present

## 2017-05-17 DIAGNOSIS — F341 Dysthymic disorder: Secondary | ICD-10-CM | POA: Diagnosis not present

## 2017-05-17 DIAGNOSIS — F606 Avoidant personality disorder: Secondary | ICD-10-CM | POA: Diagnosis not present

## 2017-05-17 DIAGNOSIS — F4310 Post-traumatic stress disorder, unspecified: Secondary | ICD-10-CM | POA: Diagnosis not present

## 2017-05-17 DIAGNOSIS — F411 Generalized anxiety disorder: Secondary | ICD-10-CM | POA: Diagnosis not present

## 2017-05-22 ENCOUNTER — Other Ambulatory Visit: Payer: Self-pay | Admitting: Neurology

## 2017-05-24 DIAGNOSIS — F411 Generalized anxiety disorder: Secondary | ICD-10-CM | POA: Diagnosis not present

## 2017-05-24 DIAGNOSIS — F341 Dysthymic disorder: Secondary | ICD-10-CM | POA: Diagnosis not present

## 2017-05-24 DIAGNOSIS — F606 Avoidant personality disorder: Secondary | ICD-10-CM | POA: Diagnosis not present

## 2017-05-24 DIAGNOSIS — F4310 Post-traumatic stress disorder, unspecified: Secondary | ICD-10-CM | POA: Diagnosis not present

## 2017-05-26 ENCOUNTER — Other Ambulatory Visit: Payer: Self-pay | Admitting: Neurology

## 2017-05-26 ENCOUNTER — Telehealth: Payer: Self-pay | Admitting: Neurology

## 2017-05-26 MED ORDER — GABAPENTIN 100 MG PO CAPS
ORAL_CAPSULE | ORAL | 1 refills | Status: DC
Start: 2017-05-26 — End: 2017-11-18

## 2017-05-26 NOTE — Telephone Encounter (Signed)
A prescription for the 100 mg capsules for gabapentin will be sent in.

## 2017-05-26 NOTE — Telephone Encounter (Signed)
[facility], [address] [phone] (Phone) [phone] (Fax)     Has advised pt to contact the office re: her prescription for the 100 mg of her Gabapentin.  Pt states she only takes the 300 mg at night.  Pt is asking to be called

## 2017-05-26 NOTE — Addendum Note (Signed)
Addended by: Kathrynn Ducking on: 05/26/2017 04:50 PM   Modules accepted: Orders

## 2017-05-26 NOTE — Telephone Encounter (Signed)
Spoke with the patient. She currently has an order for Gabapentin 300 mg TID PRN. Her previous prescription had the same instructions however it was with 100 mg capsules. She had some leftover and was using 100 mg capsules to try take a lower dose. She has been taking 200 mg in the AM, 200 mg in the afternoon and 300 mg at night. The patient requested to change back to the 100 mg capsules. RN advised likely not a problem but will review this change with work-in MD as Dr. Jaynee Eagles is currently out of the office. The patient was very appreciative and would like this order placed today if possible so she can pick them up tomorrow before going out of town.

## 2017-06-14 DIAGNOSIS — F4310 Post-traumatic stress disorder, unspecified: Secondary | ICD-10-CM | POA: Diagnosis not present

## 2017-06-14 DIAGNOSIS — F411 Generalized anxiety disorder: Secondary | ICD-10-CM | POA: Diagnosis not present

## 2017-06-14 DIAGNOSIS — F341 Dysthymic disorder: Secondary | ICD-10-CM | POA: Diagnosis not present

## 2017-06-14 DIAGNOSIS — F606 Avoidant personality disorder: Secondary | ICD-10-CM | POA: Diagnosis not present

## 2017-06-15 DIAGNOSIS — F332 Major depressive disorder, recurrent severe without psychotic features: Secondary | ICD-10-CM | POA: Diagnosis not present

## 2017-06-21 DIAGNOSIS — F411 Generalized anxiety disorder: Secondary | ICD-10-CM | POA: Diagnosis not present

## 2017-06-21 DIAGNOSIS — N183 Chronic kidney disease, stage 3 (moderate): Secondary | ICD-10-CM | POA: Diagnosis not present

## 2017-06-21 DIAGNOSIS — F606 Avoidant personality disorder: Secondary | ICD-10-CM | POA: Diagnosis not present

## 2017-06-21 DIAGNOSIS — E7800 Pure hypercholesterolemia, unspecified: Secondary | ICD-10-CM | POA: Diagnosis not present

## 2017-06-21 DIAGNOSIS — F341 Dysthymic disorder: Secondary | ICD-10-CM | POA: Diagnosis not present

## 2017-06-21 DIAGNOSIS — F4310 Post-traumatic stress disorder, unspecified: Secondary | ICD-10-CM | POA: Diagnosis not present

## 2017-06-28 DIAGNOSIS — F606 Avoidant personality disorder: Secondary | ICD-10-CM | POA: Diagnosis not present

## 2017-06-28 DIAGNOSIS — F4310 Post-traumatic stress disorder, unspecified: Secondary | ICD-10-CM | POA: Diagnosis not present

## 2017-06-28 DIAGNOSIS — F341 Dysthymic disorder: Secondary | ICD-10-CM | POA: Diagnosis not present

## 2017-06-28 DIAGNOSIS — F411 Generalized anxiety disorder: Secondary | ICD-10-CM | POA: Diagnosis not present

## 2017-07-05 DIAGNOSIS — F411 Generalized anxiety disorder: Secondary | ICD-10-CM | POA: Diagnosis not present

## 2017-07-05 DIAGNOSIS — F4310 Post-traumatic stress disorder, unspecified: Secondary | ICD-10-CM | POA: Diagnosis not present

## 2017-07-05 DIAGNOSIS — F341 Dysthymic disorder: Secondary | ICD-10-CM | POA: Diagnosis not present

## 2017-07-05 DIAGNOSIS — F606 Avoidant personality disorder: Secondary | ICD-10-CM | POA: Diagnosis not present

## 2017-07-08 DIAGNOSIS — R197 Diarrhea, unspecified: Secondary | ICD-10-CM | POA: Diagnosis not present

## 2017-07-08 DIAGNOSIS — G5793 Unspecified mononeuropathy of bilateral lower limbs: Secondary | ICD-10-CM | POA: Diagnosis not present

## 2017-07-08 DIAGNOSIS — E785 Hyperlipidemia, unspecified: Secondary | ICD-10-CM | POA: Diagnosis not present

## 2017-07-08 DIAGNOSIS — F419 Anxiety disorder, unspecified: Secondary | ICD-10-CM | POA: Diagnosis not present

## 2017-07-12 DIAGNOSIS — F411 Generalized anxiety disorder: Secondary | ICD-10-CM | POA: Diagnosis not present

## 2017-07-12 DIAGNOSIS — F341 Dysthymic disorder: Secondary | ICD-10-CM | POA: Diagnosis not present

## 2017-07-12 DIAGNOSIS — F606 Avoidant personality disorder: Secondary | ICD-10-CM | POA: Diagnosis not present

## 2017-07-12 DIAGNOSIS — F4310 Post-traumatic stress disorder, unspecified: Secondary | ICD-10-CM | POA: Diagnosis not present

## 2017-07-19 DIAGNOSIS — F606 Avoidant personality disorder: Secondary | ICD-10-CM | POA: Diagnosis not present

## 2017-07-19 DIAGNOSIS — F341 Dysthymic disorder: Secondary | ICD-10-CM | POA: Diagnosis not present

## 2017-07-19 DIAGNOSIS — F4310 Post-traumatic stress disorder, unspecified: Secondary | ICD-10-CM | POA: Diagnosis not present

## 2017-07-19 DIAGNOSIS — F411 Generalized anxiety disorder: Secondary | ICD-10-CM | POA: Diagnosis not present

## 2017-07-26 DIAGNOSIS — F411 Generalized anxiety disorder: Secondary | ICD-10-CM | POA: Diagnosis not present

## 2017-07-26 DIAGNOSIS — F606 Avoidant personality disorder: Secondary | ICD-10-CM | POA: Diagnosis not present

## 2017-07-26 DIAGNOSIS — F4310 Post-traumatic stress disorder, unspecified: Secondary | ICD-10-CM | POA: Diagnosis not present

## 2017-07-26 DIAGNOSIS — F341 Dysthymic disorder: Secondary | ICD-10-CM | POA: Diagnosis not present

## 2017-08-02 DIAGNOSIS — F341 Dysthymic disorder: Secondary | ICD-10-CM | POA: Diagnosis not present

## 2017-08-02 DIAGNOSIS — F606 Avoidant personality disorder: Secondary | ICD-10-CM | POA: Diagnosis not present

## 2017-08-02 DIAGNOSIS — F411 Generalized anxiety disorder: Secondary | ICD-10-CM | POA: Diagnosis not present

## 2017-08-02 DIAGNOSIS — F4310 Post-traumatic stress disorder, unspecified: Secondary | ICD-10-CM | POA: Diagnosis not present

## 2017-08-08 ENCOUNTER — Telehealth: Payer: Self-pay | Admitting: *Deleted

## 2017-08-08 ENCOUNTER — Ambulatory Visit: Payer: BLUE CROSS/BLUE SHIELD | Admitting: Neurology

## 2017-08-08 NOTE — Telephone Encounter (Signed)
Spoke with pt and r/s her to this Friday 7/12 @ 10:30 arrival 10:00. Pt appreciative.

## 2017-08-08 NOTE — Telephone Encounter (Addendum)
Called pt & LVM (ok per DPR) on both home & cell informing pt we will need cancel her appt for today 7/8 @ 11:00 d/t MD out of office. Will call her back to r/s asap.

## 2017-08-09 DIAGNOSIS — F606 Avoidant personality disorder: Secondary | ICD-10-CM | POA: Diagnosis not present

## 2017-08-09 DIAGNOSIS — F4310 Post-traumatic stress disorder, unspecified: Secondary | ICD-10-CM | POA: Diagnosis not present

## 2017-08-09 DIAGNOSIS — F411 Generalized anxiety disorder: Secondary | ICD-10-CM | POA: Diagnosis not present

## 2017-08-09 DIAGNOSIS — F341 Dysthymic disorder: Secondary | ICD-10-CM | POA: Diagnosis not present

## 2017-08-10 DIAGNOSIS — E559 Vitamin D deficiency, unspecified: Secondary | ICD-10-CM | POA: Diagnosis not present

## 2017-08-12 ENCOUNTER — Ambulatory Visit (INDEPENDENT_AMBULATORY_CARE_PROVIDER_SITE_OTHER): Payer: BLUE CROSS/BLUE SHIELD | Admitting: Neurology

## 2017-08-12 ENCOUNTER — Telehealth: Payer: Self-pay | Admitting: Neurology

## 2017-08-12 ENCOUNTER — Encounter: Payer: Self-pay | Admitting: Neurology

## 2017-08-12 VITALS — BP 108/64 | HR 67 | Ht 64.5 in | Wt 156.0 lb

## 2017-08-12 DIAGNOSIS — R42 Dizziness and giddiness: Secondary | ICD-10-CM

## 2017-08-12 DIAGNOSIS — R252 Cramp and spasm: Secondary | ICD-10-CM

## 2017-08-12 DIAGNOSIS — H8110 Benign paroxysmal vertigo, unspecified ear: Secondary | ICD-10-CM | POA: Insufficient documentation

## 2017-08-12 DIAGNOSIS — G629 Polyneuropathy, unspecified: Secondary | ICD-10-CM

## 2017-08-12 DIAGNOSIS — H8111 Benign paroxysmal vertigo, right ear: Secondary | ICD-10-CM

## 2017-08-12 NOTE — Progress Notes (Signed)
[facility]    Provider:  Dr Jaynee Eagles Referring Provider: Darcus Austin, MD Primary Care Physician:  Darcus Austin, MD  CC: Neuropathy, unexplained spasticity, acute onset vertigo  She is having episodes of BPPV. Discussed etiology, she went to vestibular therapy last year but not doing exercises at home. On the right side. Mostly in the morning but also at night. No nausea or vomiting. Happening daily when in bed turning. Affecting her balance. Discussed BPPV, etiology and watched a you tube video and then practiced the procedure. Discussed genetic testing risks and benefits, risk finding abnormalities and not knowing if significant, they have genetic counselors, also future impact on insurance and other factors. She would like to go forward. She was started on Naltrexone 4.5mg  at bedtime and feels it is helping neuropathy.   Interval history 02/02/2017: Patient is here for follow up of vertigo.  She was on the massage table to the right she had spinning. She couldn't walk or keep balance. She still has vertigo. Happens when she moves her head. She gets massages all the time. It was gentle turning forceful and not an "adjustment".   But the vertigo was acute as soon as the massage therapist manipulated her head position. Discuss concern for vertebral dissection and stroke.   Interval history 07/02/2016: Patient is here for follow-up of biopsy proven small fiber neuropathy. Extensive testing has been unremarkable including SPEP, ANA, sedimentation rate, TSH, CMP, B12, vitamin D, hemoglobin A1c (per pcp notes and unremarkable) and here in our clinic: vitamin B1, CRP, Lyme, B6, heavy metals, rheumatoid factor,pan-anca, Sjogren's, angiotensin-converting enzyme, BMP. Will check b12 and hgba1c again.  She had chemotherapy with a platinum-based drug. She was tested for celiac disease in 2016 which was negative.   Interval history 03/03/2016: Here for a new problem today burning in the legs.  She has new tingling in her feet. She saw a dermatologist for the rash and he gave her some topical steroids. The rash is better. The tingling is still there. She has swelling in her right foot. She has incontinence. Started a few months ago. Thought it was dry skin. Burning and tingling. The whole foot feels hot both of them. No inciting events. No new medications. Nothing new. Feels swollen behind the knees. Not bad at night, not worse at night. Symptoms are worsening, she is noticing it more. Continuous. No weakness. No change in back pain. No cramping, hands not involved. Nothing makes it better or worse, standing too long may make it worse.   Labs tested: spep, ANA, sed rate, TSH, CMP, B12, Vit D, hgba1c, urinalysis  Interval history: MRI of the brain and thoracic spine were unremarkable. A year ago she pushed a box with her foot, pain exploded up her leg, thought she pulled a muscle, she was diagnosed with bursitis, She saw orthopaedics and diagnosed with bursitis again, an injection made her whole leg go numb, She has hip and lower back pain. She has done physical therapy. [facility] [place] [facility]. She is stretching and strengthening. She has lower back pain with radiation down both legs. She has continued weakness.   PFX:TKWIO G Vocciis a very nice 62 y.o.femalehere as a referral from Dr. Val Eagle chronic headaches. PMHx major depression, generalized anxiety, headaches, PTSD, Stage 3 kidney disease, Osteopenia, colon caner s/p right hemicolectomy 2006. She has headaches for a year, no inciting events or head trauma but she was trying all kinds "loads" of medications for her mood  disorder. Never had headaches int he past or any history of migraines. She has multiple types of headaches, occasionally will get ice pick in the right temporal area which occurs every several weeks and is brief, no eye watering or nose running but very painful, no time of day preference. She  has a headache right now on the top of her head and behind the eyes, she saw an ophthalmologist who sent her stat for temporal arteritis but labwork was negative. 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 The headache is a pressure headache, more pressure and pain, consistent and continuous over 4-5 hours and a few tylenol help, she takes tylenol 3-4 times a week. She had TMS therapy for depression last year and this resulted in headaches. Headaches most days of the week,she wakes up with headaches. She snores mildly. She had an MRI of the brain but may have started after the MRI last April. The clonidine was started about the same time as the headaches. She has no significant daytime somnolence and no other signs of obstructive sleep apnea. No other focal neurologic deficits or complaints. No double vision, no aphasia, no dysarthria, no weakness, no sensory changes. No bowel or bladder changes. No fever or systemic symptoms. She also has a lot of tightness in the neck associated with headaches.  Reviewed notes, labs and imaging from outside physicians, which showed: Patient brings in a CD with the images, personally reviewed. MRI of the cervical spine was largely unremarkable, at C4-C5 minimal central canal and moderate right foraminal stenosis. At C5-C6 minimal central canal and moderate right foraminal stenosis.  Patient was referred by [facility] orthopedics. She is seeing them for neck pain. Neck Stiffness and headaches. Symptoms exacerbated by turning the head to the right and turning the head to the left. Treated with non-opioid analgesics. Patient also reported low back pain. Mild neck discomfort, worse with range of motion and deep palpation. Exam significant for 10 beats of clonus in the lower extremities bilaterally, 3+ brisk deep tendon reflexes at the knees, Achilles, biceps, triceps and brachial radialis. Toes are flexor. Negative Hoffman sign. Normal gait pattern. Slight limp when she walks because of  right-sided gluteal and lateral hip pain that she is not ataxic and she has no significant imbalance. Negative Romberg. MRI reviewed showed no cord signal changes. Mild multilevel degenerative disease no significant high-grade neural compression. No evidence to suggest cervical spondylitic myelopathy.  BMP unremarkable creatinine slightly elevated 1.02, CBC normal     Social History   Socioeconomic History  . Marital status: Married    Spouse name: Herbie [place]  . Number of children: 1  . Years of education: 61  . Highest education level: Not on file  Occupational History  . Occupation: Press photographer- Retired  Scientific laboratory technician  . Financial resource strain: Not on file  . Food insecurity:    Worry: Not on file    Inability: Not on file  . Transportation needs:    Medical: Not on file    Non-medical: Not on file  Tobacco Use  . Smoking status: Never Smoker  . Smokeless tobacco: Never Used  Substance and Sexual Activity  . Alcohol use: Yes    Comment: occ  . Drug use: No  . Sexual activity: Not on file  Lifestyle  . Physical activity:    Days per week: Not on file    Minutes per session: Not on file  . Stress: Not on file  Relationships  . Social connections:  Talks on phone: Not on file    Gets together: Not on file    Attends religious service: Not on file    Active member of club or organization: Not on file    Attends meetings of clubs or organizations: Not on file    Relationship status: Not on file  . Intimate partner violence:    Fear of current or ex partner: Not on file    Emotionally abused: Not on file    Physically abused: Not on file    Forced sexual activity: Not on file  Other Topics Concern  . Not on file  Social History Narrative   Lives with husband   Caffeine use: 1 cup daily   Right handed    Family History  Problem Relation Age of Onset  . Diabetes Mother        type 1  . Stroke Mother   . Dementia Mother   . Thyroid disease Mother   . Alcohol  abuse Mother   . Asthma Mother   . Alcohol abuse Father   . Diabetes Father   . Cancer Paternal Aunt        germ cell  . Cancer Maternal Grandmother        colon  . Cancer Maternal Uncle        throat  . Thyroid disease Sister   . Alcohol abuse Brother   . Psychiatric Illness Brother   . Psychiatric Illness Sister   . Asthma Sister   . Neuropathy Neg Hx     Past Medical History:  Diagnosis Date  . Anxiety   . Anxiety and depression 08/24/2011  . Arthritis   . Cancer ([place])    colon  . Depression   . Hx of colon cancer, stage II 08/24/2011   T3N0  adenoca cecum resected 04/2004  Xeloda adjuvant chemo  . Hyperlipidemia 08/24/2011  . Joint pain   . Nausea & vomiting   . Neuropathy   . PONV (postoperative nausea and vomiting)     Past Surgical History:  Procedure Laterality Date  . ABDOMINAL HYSTERECTOMY  2004  . BREAST SURGERY  2011   reduction  . CHOLECYSTECTOMY  10/05/2011   Procedure: LAPAROSCOPIC CHOLECYSTECTOMY WITH INTRAOPERATIVE CHOLANGIOGRAM;  Surgeon: Stark Klein, MD;  Location: [facility];  Service: General;  Laterality: N/A;  . [place]  . HEMICOLECTOMY Right 2006  . HERNIA REPAIR  2007  . KNEE ARTHROSCOPY  2010  . NASAL SEPTUM SURGERY  1983  . OTHER SURGICAL HISTORY  2016   Transcranial magnetic stimulation  . PORT-A-CATH REMOVAL  2006   insertion and removal  . REDUCTION MAMMAPLASTY Bilateral 2010  . STRABISMUS SURGERY  1962    Current Outpatient Medications  Medication Sig Dispense Refill  . ALPRAZolam (XANAX) 0.5 MG tablet Take 1-2 tablets 30-60 minutes before MRI. May repeat if needed 6 tablet 0  . busPIRone (BUSPAR) 30 MG tablet Take 60 mg by mouth daily.   0  . colestipol (COLESTID) 1 g tablet Take 1 g by mouth daily.   0  . dicyclomine (BENTYL) 20 MG tablet Take 20 mg by mouth as needed for spasms.     Marland Kitchen gabapentin (NEURONTIN) 100 MG capsule 2 capsules in the morning, 2 at midday, and 3 in the evening 630 capsule 1  .  lamoTRIgine (LAMICTAL) 100 MG tablet Take 200 mg by mouth daily.    Marland Kitchen NALTREXONE HCL PO Take 4.5 mg by mouth at bedtime.    Marland Kitchen  simvastatin (ZOCOR) 40 MG tablet TK 1 T PO IN THE EVENING  4  . valACYclovir (VALTREX) 500 MG tablet Take 4 tablets by mouth. Take 4 tablets and then another 4 tablets 12 hours later as needed for fever blister  0  . Vitamin D, Ergocalciferol, (DRISDOL) 50000 units CAPS capsule TK 1 C PO ONCE A WK FOR 3 WKS A MONTH  2   No current facility-administered medications for this visit.     Allergies as of 08/12/2017 - Review Complete 08/12/2017  Allergen Reaction Noted  . Demeclocycline Nausea Only 08/24/2011  . Dilaudid [hydromorphone hcl] Itching 08/24/2011  . Fentanyl Itching 08/22/2012  . Morphine and related Itching 08/24/2011  . Nsaids  08/13/2016  . Percocet [oxycodone-acetaminophen]  08/21/2013  . Tetracyclines & related Nausea Only 08/24/2011  . Sulfa antibiotics Rash 08/24/2011    Vitals: BP 108/64   Pulse 67   Ht 5' 4.5" (1.638 m)   Wt 156 lb (70.8 kg)   BMI 26.36 kg/m  Last Weight:  Wt Readings from Last 1 Encounters:  08/12/17 156 lb (70.8 kg)   Last Height:   Ht Readings from Last 1 Encounters:  08/12/17 5' 4.5" (1.638 m)    Strength intact. No significant decrease in any sensory modality distally in the extremities. Gait normal. Brisk reflexes with clonus in the lower extremities, toes downgoing.   Assessment/Plan:Very nice 62 y.o. female here as a referral from Dr. Rolena Infante for chronic headaches and low back pain. PMHx major depression, generalized anxiety, headaches, PTSD, Stage 3 kidney disease, Osteopenia, colon caner s/p right hemicolectomy 2006. Patient also has some interesting physical findings including bilateral lower extremity clonus at the patellars and the ankle jerks but imaging of the brain, cervical and thoracic spine unremarkable for UMN disease may be genetic or neurodegenerative.  Vertigo during a massage with therapist  moving her head to the right, vertigo continues, need MRI of the brain and MRA of the head and neck looking for dissection and stroke. All was negative. Likely BPPV but sister with meniere's will refer to dr Ernesto Rutherford.   Spasticity and clonus with negative MRI of the neuro axis: May be degenerative or genetic. Will refer to [facility] clinic/[facility]uromuscular specialist at [facility] Follow clinically.   She has biopsy proven small-fiber neuropathy, discussed findings and reviewed biopsy results,  most significant risk factor found so far is previous platinum-based chemotherapy. Had a long discussion about small-fiber neuropathy, causes and progression. Discussed the causes of peripheral neuropathy, the most common being diabetes which patient does not report having. About 20 million people in the [place] states have some form of peripheral neuropathy. This is a condition that develops as a result of damage to the peripheral nervous system.  There are multiple causes eluding metabolic, toxic, infectious and endocrine disorders, small vessel disease, autoimmune diseases, and others. We have  performed extensive serum neuropathy screening as well as an EMG nerve conduction study. Will order genetic testing.    Vertigo: Performed epley maneuvers, discussed and watched video on the procedure. She has been to vestibular therapy in the past. Will refer to Dr. Ernesto Rutherford, sister has Menier's  Orders Placed This Encounter  Procedures  . Ambulatory referral to ENT    Sarina Ill, MD  [facility] [address] [place], [place] [address]  Phone [phone] Fax [phone]  A total of 25 minutes was spent face-to-face with this patient. Over half this time was spent on counseling patient on the  1. Vertigo  2. Neuropathy   3. Spasticity     diagnosis and different diagnostic and therapeutic options, counseling and coordination of care, risks ans benefits of management, compliance, or  risk factor reduction and education.

## 2017-08-12 NOTE — Patient Instructions (Signed)

## 2017-08-12 NOTE — Telephone Encounter (Signed)
Lorraine May, would like to send [facility] genetic panel, to patient please order.

## 2017-08-15 NOTE — Telephone Encounter (Addendum)
Order form completed. Ready for MD signature.   Called pt & clarified her ancestry for order form. Also reviewed general instructions with pt. She is aware [facility] will be contacting her and she will have choice of blood or saliva specimen. She is aware to make sure specimen is labeled with full name, dob and specimen collection date. Pt appreciative.

## 2017-08-15 NOTE — Telephone Encounter (Signed)
Genetic testing panel order form signed & faxed to [facility]. Received a receipt of confirmation.

## 2017-08-16 DIAGNOSIS — F606 Avoidant personality disorder: Secondary | ICD-10-CM | POA: Diagnosis not present

## 2017-08-16 DIAGNOSIS — F411 Generalized anxiety disorder: Secondary | ICD-10-CM | POA: Diagnosis not present

## 2017-08-16 DIAGNOSIS — F341 Dysthymic disorder: Secondary | ICD-10-CM | POA: Diagnosis not present

## 2017-08-16 DIAGNOSIS — F4310 Post-traumatic stress disorder, unspecified: Secondary | ICD-10-CM | POA: Diagnosis not present

## 2017-08-23 DIAGNOSIS — F606 Avoidant personality disorder: Secondary | ICD-10-CM | POA: Diagnosis not present

## 2017-08-23 DIAGNOSIS — F411 Generalized anxiety disorder: Secondary | ICD-10-CM | POA: Diagnosis not present

## 2017-08-23 DIAGNOSIS — F4310 Post-traumatic stress disorder, unspecified: Secondary | ICD-10-CM | POA: Diagnosis not present

## 2017-08-23 DIAGNOSIS — F341 Dysthymic disorder: Secondary | ICD-10-CM | POA: Diagnosis not present

## 2017-08-29 ENCOUNTER — Ambulatory Visit
Admission: RE | Admit: 2017-08-29 | Discharge: 2017-08-29 | Disposition: A | Discharge status: Home / Self Care | Payer: BLUE CROSS/BLUE SHIELD | Source: Ambulatory Visit | Attending: Otolaryngology | Admitting: Otolaryngology

## 2017-08-29 ENCOUNTER — Other Ambulatory Visit: Payer: Self-pay | Admitting: Otolaryngology

## 2017-08-29 DIAGNOSIS — H903 Sensorineural hearing loss, bilateral: Secondary | ICD-10-CM | POA: Diagnosis not present

## 2017-08-29 DIAGNOSIS — J3081 Allergic rhinitis due to animal (cat) (dog) hair and dander: Secondary | ICD-10-CM | POA: Diagnosis not present

## 2017-08-29 DIAGNOSIS — J329 Chronic sinusitis, unspecified: Secondary | ICD-10-CM

## 2017-08-29 DIAGNOSIS — J339 Nasal polyp, unspecified: Secondary | ICD-10-CM

## 2017-08-29 DIAGNOSIS — H8110 Benign paroxysmal vertigo, unspecified ear: Secondary | ICD-10-CM | POA: Diagnosis not present

## 2017-08-29 DIAGNOSIS — J301 Allergic rhinitis due to pollen: Secondary | ICD-10-CM | POA: Diagnosis not present

## 2017-08-29 DIAGNOSIS — J305 Allergic rhinitis due to food: Secondary | ICD-10-CM | POA: Diagnosis not present

## 2017-08-30 DIAGNOSIS — F606 Avoidant personality disorder: Secondary | ICD-10-CM | POA: Diagnosis not present

## 2017-08-30 DIAGNOSIS — F411 Generalized anxiety disorder: Secondary | ICD-10-CM | POA: Diagnosis not present

## 2017-08-30 DIAGNOSIS — F341 Dysthymic disorder: Secondary | ICD-10-CM | POA: Diagnosis not present

## 2017-08-30 DIAGNOSIS — F4310 Post-traumatic stress disorder, unspecified: Secondary | ICD-10-CM | POA: Diagnosis not present

## 2017-09-06 DIAGNOSIS — F606 Avoidant personality disorder: Secondary | ICD-10-CM | POA: Diagnosis not present

## 2017-09-06 DIAGNOSIS — F4310 Post-traumatic stress disorder, unspecified: Secondary | ICD-10-CM | POA: Diagnosis not present

## 2017-09-06 DIAGNOSIS — F411 Generalized anxiety disorder: Secondary | ICD-10-CM | POA: Diagnosis not present

## 2017-09-06 DIAGNOSIS — F341 Dysthymic disorder: Secondary | ICD-10-CM | POA: Diagnosis not present

## 2017-09-13 DIAGNOSIS — F411 Generalized anxiety disorder: Secondary | ICD-10-CM | POA: Diagnosis not present

## 2017-09-13 DIAGNOSIS — F4310 Post-traumatic stress disorder, unspecified: Secondary | ICD-10-CM | POA: Diagnosis not present

## 2017-09-13 DIAGNOSIS — F341 Dysthymic disorder: Secondary | ICD-10-CM | POA: Diagnosis not present

## 2017-09-13 DIAGNOSIS — F606 Avoidant personality disorder: Secondary | ICD-10-CM | POA: Diagnosis not present

## 2017-09-27 DIAGNOSIS — F341 Dysthymic disorder: Secondary | ICD-10-CM | POA: Diagnosis not present

## 2017-09-27 DIAGNOSIS — F411 Generalized anxiety disorder: Secondary | ICD-10-CM | POA: Diagnosis not present

## 2017-09-27 DIAGNOSIS — F4310 Post-traumatic stress disorder, unspecified: Secondary | ICD-10-CM | POA: Diagnosis not present

## 2017-09-27 DIAGNOSIS — F606 Avoidant personality disorder: Secondary | ICD-10-CM | POA: Diagnosis not present

## 2017-10-11 DIAGNOSIS — F606 Avoidant personality disorder: Secondary | ICD-10-CM | POA: Diagnosis not present

## 2017-10-11 DIAGNOSIS — F4310 Post-traumatic stress disorder, unspecified: Secondary | ICD-10-CM | POA: Diagnosis not present

## 2017-10-11 DIAGNOSIS — F341 Dysthymic disorder: Secondary | ICD-10-CM | POA: Diagnosis not present

## 2017-10-11 DIAGNOSIS — F411 Generalized anxiety disorder: Secondary | ICD-10-CM | POA: Diagnosis not present

## 2017-10-12 DIAGNOSIS — H2511 Age-related nuclear cataract, right eye: Secondary | ICD-10-CM | POA: Diagnosis not present

## 2017-10-12 DIAGNOSIS — H5203 Hypermetropia, bilateral: Secondary | ICD-10-CM | POA: Diagnosis not present

## 2017-10-12 DIAGNOSIS — D3132 Benign neoplasm of left choroid: Secondary | ICD-10-CM | POA: Diagnosis not present

## 2017-10-12 DIAGNOSIS — H25042 Posterior subcapsular polar age-related cataract, left eye: Secondary | ICD-10-CM | POA: Diagnosis not present

## 2017-10-15 ENCOUNTER — Encounter: Payer: Self-pay | Admitting: *Deleted

## 2017-10-18 DIAGNOSIS — Z13820 Encounter for screening for osteoporosis: Secondary | ICD-10-CM | POA: Diagnosis not present

## 2017-10-18 DIAGNOSIS — Z86010 Personal history of colonic polyps: Secondary | ICD-10-CM | POA: Diagnosis not present

## 2017-10-18 DIAGNOSIS — Z6828 Body mass index (BMI) 28.0-28.9, adult: Secondary | ICD-10-CM | POA: Diagnosis not present

## 2017-10-18 DIAGNOSIS — Z800 Family history of malignant neoplasm of digestive organs: Secondary | ICD-10-CM | POA: Diagnosis not present

## 2017-10-18 DIAGNOSIS — Z85038 Personal history of other malignant neoplasm of large intestine: Secondary | ICD-10-CM | POA: Diagnosis not present

## 2017-10-18 DIAGNOSIS — Z01419 Encounter for gynecological examination (general) (routine) without abnormal findings: Secondary | ICD-10-CM | POA: Diagnosis not present

## 2017-10-18 DIAGNOSIS — Z1212 Encounter for screening for malignant neoplasm of rectum: Secondary | ICD-10-CM | POA: Diagnosis not present

## 2017-10-18 DIAGNOSIS — Z808 Family history of malignant neoplasm of other organs or systems: Secondary | ICD-10-CM | POA: Diagnosis not present

## 2017-10-25 DIAGNOSIS — F341 Dysthymic disorder: Secondary | ICD-10-CM | POA: Diagnosis not present

## 2017-10-25 DIAGNOSIS — F411 Generalized anxiety disorder: Secondary | ICD-10-CM | POA: Diagnosis not present

## 2017-10-25 DIAGNOSIS — F606 Avoidant personality disorder: Secondary | ICD-10-CM | POA: Diagnosis not present

## 2017-10-25 DIAGNOSIS — F4310 Post-traumatic stress disorder, unspecified: Secondary | ICD-10-CM | POA: Diagnosis not present

## 2017-11-01 ENCOUNTER — Encounter: Payer: Self-pay | Admitting: Neurology

## 2017-11-01 DIAGNOSIS — F411 Generalized anxiety disorder: Secondary | ICD-10-CM | POA: Diagnosis not present

## 2017-11-01 DIAGNOSIS — F606 Avoidant personality disorder: Secondary | ICD-10-CM | POA: Diagnosis not present

## 2017-11-01 DIAGNOSIS — F341 Dysthymic disorder: Secondary | ICD-10-CM | POA: Diagnosis not present

## 2017-11-01 DIAGNOSIS — F4310 Post-traumatic stress disorder, unspecified: Secondary | ICD-10-CM | POA: Diagnosis not present

## 2017-11-02 ENCOUNTER — Other Ambulatory Visit: Payer: Self-pay

## 2017-11-02 DIAGNOSIS — E7800 Pure hypercholesterolemia, unspecified: Secondary | ICD-10-CM | POA: Diagnosis not present

## 2017-11-02 DIAGNOSIS — E559 Vitamin D deficiency, unspecified: Secondary | ICD-10-CM | POA: Diagnosis not present

## 2017-11-02 DIAGNOSIS — N183 Chronic kidney disease, stage 3 (moderate): Secondary | ICD-10-CM | POA: Diagnosis not present

## 2017-11-02 DIAGNOSIS — Z23 Encounter for immunization: Secondary | ICD-10-CM | POA: Diagnosis not present

## 2017-11-02 MED ORDER — ALPRAZOLAM 0.25 MG PO TABS: ORAL_TABLET | ORAL | 0 refills | Status: DC

## 2017-11-02 MED ORDER — ZOLPIDEM TARTRATE 5 MG PO TABS: 5.0000 mg | ORAL_TABLET | Freq: Every evening | ORAL | 2 refills | Status: DC | PRN

## 2017-11-02 NOTE — Telephone Encounter (Signed)
RX request from pharmacy regarding refills for Zolpidem 5mg  #30 refills 2, and Xanax 0.25 PRN #15 no refills. Nurse Traci called into [facility] on [address] on 11/01/17. Patient does have upcoming appointment on 12/28/17.

## 2017-11-04 ENCOUNTER — Ambulatory Visit: Payer: BLUE CROSS/BLUE SHIELD | Admitting: Psychiatry

## 2017-11-18 ENCOUNTER — Other Ambulatory Visit: Payer: Self-pay | Admitting: Neurology

## 2017-11-21 ENCOUNTER — Encounter: Payer: Self-pay | Admitting: Neurology

## 2017-11-21 DIAGNOSIS — D229 Melanocytic nevi, unspecified: Secondary | ICD-10-CM | POA: Diagnosis not present

## 2017-11-21 DIAGNOSIS — B079 Viral wart, unspecified: Secondary | ICD-10-CM | POA: Diagnosis not present

## 2017-11-22 DIAGNOSIS — F4310 Post-traumatic stress disorder, unspecified: Secondary | ICD-10-CM | POA: Diagnosis not present

## 2017-11-22 DIAGNOSIS — F341 Dysthymic disorder: Secondary | ICD-10-CM | POA: Diagnosis not present

## 2017-11-22 DIAGNOSIS — F411 Generalized anxiety disorder: Secondary | ICD-10-CM | POA: Diagnosis not present

## 2017-11-22 DIAGNOSIS — F606 Avoidant personality disorder: Secondary | ICD-10-CM | POA: Diagnosis not present

## 2017-11-28 DIAGNOSIS — Z809 Family history of malignant neoplasm, unspecified: Secondary | ICD-10-CM | POA: Diagnosis not present

## 2017-11-29 DIAGNOSIS — F341 Dysthymic disorder: Secondary | ICD-10-CM | POA: Diagnosis not present

## 2017-11-29 DIAGNOSIS — F606 Avoidant personality disorder: Secondary | ICD-10-CM | POA: Diagnosis not present

## 2017-11-29 DIAGNOSIS — F411 Generalized anxiety disorder: Secondary | ICD-10-CM | POA: Diagnosis not present

## 2017-11-29 DIAGNOSIS — F4310 Post-traumatic stress disorder, unspecified: Secondary | ICD-10-CM | POA: Diagnosis not present

## 2017-12-06 DIAGNOSIS — F4310 Post-traumatic stress disorder, unspecified: Secondary | ICD-10-CM | POA: Diagnosis not present

## 2017-12-06 DIAGNOSIS — F341 Dysthymic disorder: Secondary | ICD-10-CM | POA: Diagnosis not present

## 2017-12-06 DIAGNOSIS — F411 Generalized anxiety disorder: Secondary | ICD-10-CM | POA: Diagnosis not present

## 2017-12-06 DIAGNOSIS — F606 Avoidant personality disorder: Secondary | ICD-10-CM | POA: Diagnosis not present

## 2017-12-11 DIAGNOSIS — F4310 Post-traumatic stress disorder, unspecified: Secondary | ICD-10-CM

## 2017-12-11 DIAGNOSIS — F411 Generalized anxiety disorder: Secondary | ICD-10-CM

## 2017-12-13 DIAGNOSIS — F4310 Post-traumatic stress disorder, unspecified: Secondary | ICD-10-CM | POA: Diagnosis not present

## 2017-12-13 DIAGNOSIS — F411 Generalized anxiety disorder: Secondary | ICD-10-CM | POA: Diagnosis not present

## 2017-12-13 DIAGNOSIS — F606 Avoidant personality disorder: Secondary | ICD-10-CM | POA: Diagnosis not present

## 2017-12-13 DIAGNOSIS — F341 Dysthymic disorder: Secondary | ICD-10-CM | POA: Diagnosis not present

## 2017-12-19 ENCOUNTER — Other Ambulatory Visit: Payer: Self-pay | Admitting: Psychiatry

## 2017-12-27 DIAGNOSIS — F341 Dysthymic disorder: Secondary | ICD-10-CM | POA: Diagnosis not present

## 2017-12-27 DIAGNOSIS — F411 Generalized anxiety disorder: Secondary | ICD-10-CM | POA: Diagnosis not present

## 2017-12-27 DIAGNOSIS — F606 Avoidant personality disorder: Secondary | ICD-10-CM | POA: Diagnosis not present

## 2017-12-27 DIAGNOSIS — F4310 Post-traumatic stress disorder, unspecified: Secondary | ICD-10-CM | POA: Diagnosis not present

## 2017-12-28 ENCOUNTER — Encounter: Payer: Self-pay | Admitting: Psychiatry

## 2017-12-28 ENCOUNTER — Ambulatory Visit (INDEPENDENT_AMBULATORY_CARE_PROVIDER_SITE_OTHER): Payer: BLUE CROSS/BLUE SHIELD | Admitting: Psychiatry

## 2017-12-28 ENCOUNTER — Encounter

## 2017-12-28 DIAGNOSIS — F411 Generalized anxiety disorder: Secondary | ICD-10-CM

## 2017-12-28 DIAGNOSIS — F331 Major depressive disorder, recurrent, moderate: Secondary | ICD-10-CM | POA: Diagnosis not present

## 2017-12-28 DIAGNOSIS — F4310 Post-traumatic stress disorder, unspecified: Secondary | ICD-10-CM

## 2017-12-28 MED ORDER — ALPRAZOLAM 0.25 MG PO TABS: 0.2500 mg | ORAL_TABLET | Freq: Two times a day (BID) | ORAL | 1 refills | Status: DC | PRN

## 2017-12-28 MED ORDER — ALPRAZOLAM 0.25 MG PO TABS: ORAL_TABLET | ORAL | 1 refills | Status: DC

## 2017-12-28 NOTE — Progress Notes (Signed)
Lorraine May [id number] [birth year] 62 y.o.  Subjective:   Patient ID:  Lorraine May is a 62 y.o. (DOB 03/05/1955) female.  Chief Complaint:  Chief Complaint  Patient presents with  . Anxiety  . Depression  . Stress    health    HPI Lorraine May presents to the office today for follow-up of Hx TRD with benefit from [place] 2016-8 at [facility].   Verty effective.  Major anxiety with the move last weekend felt overwhelmed.  Handled it but fears recurrence of sx and doesn't want to decrease the meds at this time.  Multiple problems with the transition.  Moved to bigger house.  Stress caused neuropathy flared.  Feet feels broken and bothers sleep and uncomfortable to walk on it.  Depression has remained ok overall.  Touch of it last week but was overwhelmed.  Sleep fairly well with gabapentin and naltrexone.  Review of Systems:  Review of Systems  Gastrointestinal: Positive for diarrhea.  Musculoskeletal: Positive for arthralgias, back pain, joint swelling and myalgias.  Neurological: Negative for tremors and weakness.       Bruning neuropathy foot and legs   Sx worse with stress  Medications: I have reviewed the patient's current medications.  Current Outpatient Medications  Medication Sig Dispense Refill  . busPIRone (BUSPAR) 30 MG tablet Take 30 mg by mouth 2 (two) times daily.   0  . colestipol (COLESTID) 1 g tablet Take 1 g by mouth daily.   0  . dicyclomine (BENTYL) 20 MG tablet Take 20 mg by mouth as needed for spasms.     Marland Kitchen gabapentin (NEURONTIN) 100 MG capsule TAKE TWO CAPSULES IN THE MORNING, 2 AT MIDDAY, AND 3 IN THE EVENING. 630 capsule 1  . lamoTRIgine (LAMICTAL) 100 MG tablet TAKE 1 TABLET BY MOUTH TWICE DAILY 60 tablet 0  . NALTREXONE HCL PO Take 4.5 mg by mouth at bedtime.    . simvastatin (ZOCOR) 40 MG tablet TK 1 T PO IN THE EVENING  4  . Vitamin D, Ergocalciferol, (DRISDOL) 50000 units CAPS capsule TK 1 C PO ONCE A WK FOR 3 WKS A MONTH  2  . zolpidem (AMBIEN) 5 MG  tablet Take 1 tablet (5 mg total) by mouth at bedtime as needed for sleep. 30 tablet 2  . ALPRAZolam (XANAX) 0.25 MG tablet Take 1-2 tablets 30-60 minutes before MRI. May repeat if needed (Patient not taking: Reported on 12/28/2017) 15 tablet 0  . valACYclovir (VALTREX) 500 MG tablet Take 4 tablets by mouth. Take 4 tablets and then another 4 tablets 12 hours later as needed for fever blister  0   No current facility-administered medications for this visit.     Medication Side Effects: None  Allergies:  Allergies  Allergen Reactions  . Demeclocycline Nausea Only  . Dilaudid [Hydromorphone Hcl] Itching  . Fentanyl Itching  . Morphine And Related Itching  . Nsaids     Cannot take due to kidney disease  . Percocet [Oxycodone-Acetaminophen]     itching  . Tetracyclines & Related Nausea Only  . Sulfa Antibiotics Rash    Past Medical History:  Diagnosis Date  . Anxiety   . Anxiety and depression 08/24/2011  . Arthritis   . Cancer ([place])    colon  . Depression   . Hx of colon cancer, stage II 08/24/2011   T3N0  adenoca cecum resected 04/2004  Xeloda adjuvant chemo  . Hyperlipidemia 08/24/2011  . Joint pain   . Nausea &  vomiting   . Neuropathy   . PONV (postoperative nausea and vomiting)     Family History  Problem Relation Age of Onset  . Diabetes Mother        type 1  . Stroke Mother   . Dementia Mother   . Thyroid disease Mother   . Alcohol abuse Mother   . Asthma Mother   . Alcohol abuse Father   . Diabetes Father   . Cancer Paternal Aunt        germ cell  . Cancer Maternal Grandmother        colon  . Cancer Maternal Uncle        throat  . Thyroid disease Sister   . Alcohol abuse Brother   . Psychiatric Illness Brother   . Psychiatric Illness Sister   . Asthma Sister   . Neuropathy Neg Hx     Social History   Socioeconomic History  . Marital status: Married    Spouse name: Herbie [place]  . Number of children: 1  . Years of education: 28  . Highest education  level: Not on file  Occupational History  . Occupation: Press photographer- Retired  Scientific laboratory technician  . Financial resource strain: Not on file  . Food insecurity:    Worry: Not on file    Inability: Not on file  . Transportation needs:    Medical: Not on file    Non-medical: Not on file  Tobacco Use  . Smoking status: Never Smoker  . Smokeless tobacco: Never Used  Substance and Sexual Activity  . Alcohol use: Yes    Comment: occ  . Drug use: No  . Sexual activity: Not on file  Lifestyle  . Physical activity:    Days per week: Not on file    Minutes per session: Not on file  . Stress: Not on file  Relationships  . Social connections:    Talks on phone: Not on file    Gets together: Not on file    Attends religious service: Not on file    Active member of club or organization: Not on file    Attends meetings of clubs or organizations: Not on file    Relationship status: Not on file  . Intimate partner violence:    Fear of current or ex partner: Not on file    Emotionally abused: Not on file    Physically abused: Not on file    Forced sexual activity: Not on file  Other Topics Concern  . Not on file  Social History Narrative   Lives with husband   Caffeine use: 1 cup daily   Right handed    Past Medical History, Surgical history, Social history, and Family history were reviewed and updated as appropriate.   Please see review of systems for further details on the patient's review from today.   Objective:   Physical Exam:  There were no vitals taken for this visit.  Physical Exam  Constitutional: She is oriented to person, place, and time. She appears well-developed. No distress.  Musculoskeletal: She exhibits no deformity.  Neurological: She is alert and oriented to person, place, and time. She displays no tremor. Coordination and gait normal.  Psychiatric: Her speech is normal and behavior is normal. Judgment and thought content normal. Her mood appears anxious. Her affect  is not angry, not blunt, not labile and not inappropriate. Cognition and memory are normal. She does not exhibit a depressed mood. She expresses no homicidal and no suicidal  ideation. She expresses no suicidal plans and no homicidal plans.  Insight intact. No auditory or visual hallucinations.  Somatic under stress.    Lab Review:     Component Value Date/Time   NA 140 02/02/2017 1554   NA 142 08/21/2013 1445   K 5.2 02/02/2017 1554   K 4.1 08/21/2013 1445   CL 102 02/02/2017 1554   CO2 24 02/02/2017 1554   CO2 26 08/21/2013 1445   GLUCOSE 97 02/02/2017 1554   GLUCOSE 120 (H) 02/07/2014 1107   GLUCOSE 91 08/21/2013 1445   BUN 19 02/02/2017 1554   BUN 18.7 08/21/2013 1445   CREATININE 1.17 (H) 02/02/2017 1554   CREATININE 1.1 08/21/2013 1445   CALCIUM 9.3 02/02/2017 1554   CALCIUM 9.1 08/21/2013 1445   PROT 6.6 03/03/2016 1124   PROT 6.5 08/21/2013 1445   ALBUMIN 4.7 02/07/2014 1107   ALBUMIN 3.7 08/21/2013 1445   AST 21 02/07/2014 1107   AST 12 08/21/2013 1445   ALT 14 02/07/2014 1107   ALT 11 08/21/2013 1445   ALKPHOS 64 02/07/2014 1107   ALKPHOS 56 08/21/2013 1445   BILITOT 0.6 02/07/2014 1107   BILITOT 0.27 08/21/2013 1445   GFRNONAA 50 (L) 02/02/2017 1554   GFRAA 57 (L) 02/02/2017 1554       Component Value Date/Time   WBC 6.3 02/07/2014 1107   RBC 4.28 02/07/2014 1107   HGB 12.9 02/07/2014 1107   HGB 12.3 08/21/2013 1445   HCT 38.7 02/07/2014 1107   HCT 37.2 08/21/2013 1445   PLT 253 02/07/2014 1107   PLT 226 08/21/2013 1445   MCV 90.4 02/07/2014 1107   MCV 93.2 08/21/2013 1445   MCH 30.1 02/07/2014 1107   MCHC 33.3 02/07/2014 1107   RDW 13.2 02/07/2014 1107   RDW 13.8 08/21/2013 1445   LYMPHSABS 1.5 08/21/2013 1445   MONOABS 0.3 08/21/2013 1445   EOSABS 0.1 08/21/2013 1445   BASOSABS 0.0 08/21/2013 1445    No results found for: POCLITH, LITHIUM   No results found for: PHENYTOIN, PHENOBARB, VALPROATE, CBMZ   .res Assessment: Plan:     Generalized anxiety disorder  PTSD (post-traumatic stress disorder)  Major depressive disorder, recurrent episode, moderate (HCC)   Cont meds that are working and tolerating well.  Maintained the TMS benefit for depression.  Disc the moving is a major stress and now is not the time to decrease the meds.  May do so later when she feels things are settled more. Consider nest time.  Also stressor is the holidays in addition the move.  This appt was 30 mins.  FU 6 mos.  Lynder Parents, MD, DFAPA   Please see After Visit Summary for patient specific instructions.  No future appointments.  No orders of the defined types were placed in this encounter.     -------------------------------

## 2018-01-03 DIAGNOSIS — F606 Avoidant personality disorder: Secondary | ICD-10-CM | POA: Diagnosis not present

## 2018-01-03 DIAGNOSIS — F341 Dysthymic disorder: Secondary | ICD-10-CM | POA: Diagnosis not present

## 2018-01-03 DIAGNOSIS — F4310 Post-traumatic stress disorder, unspecified: Secondary | ICD-10-CM | POA: Diagnosis not present

## 2018-01-03 DIAGNOSIS — F411 Generalized anxiety disorder: Secondary | ICD-10-CM | POA: Diagnosis not present

## 2018-01-10 DIAGNOSIS — F606 Avoidant personality disorder: Secondary | ICD-10-CM | POA: Diagnosis not present

## 2018-01-10 DIAGNOSIS — F4310 Post-traumatic stress disorder, unspecified: Secondary | ICD-10-CM | POA: Diagnosis not present

## 2018-01-10 DIAGNOSIS — F411 Generalized anxiety disorder: Secondary | ICD-10-CM | POA: Diagnosis not present

## 2018-01-10 DIAGNOSIS — F341 Dysthymic disorder: Secondary | ICD-10-CM | POA: Diagnosis not present

## 2018-01-12 DIAGNOSIS — M79672 Pain in left foot: Secondary | ICD-10-CM | POA: Diagnosis not present

## 2018-01-15 ENCOUNTER — Other Ambulatory Visit: Payer: Self-pay | Admitting: Psychiatry

## 2018-01-17 DIAGNOSIS — F341 Dysthymic disorder: Secondary | ICD-10-CM | POA: Diagnosis not present

## 2018-01-17 DIAGNOSIS — F4310 Post-traumatic stress disorder, unspecified: Secondary | ICD-10-CM | POA: Diagnosis not present

## 2018-01-17 DIAGNOSIS — F606 Avoidant personality disorder: Secondary | ICD-10-CM | POA: Diagnosis not present

## 2018-01-17 DIAGNOSIS — F411 Generalized anxiety disorder: Secondary | ICD-10-CM | POA: Diagnosis not present

## 2018-01-20 DIAGNOSIS — M79672 Pain in left foot: Secondary | ICD-10-CM | POA: Diagnosis not present

## 2018-01-27 DIAGNOSIS — M13872 Other specified arthritis, left ankle and foot: Secondary | ICD-10-CM | POA: Diagnosis not present

## 2018-01-31 DIAGNOSIS — F411 Generalized anxiety disorder: Secondary | ICD-10-CM | POA: Diagnosis not present

## 2018-01-31 DIAGNOSIS — F606 Avoidant personality disorder: Secondary | ICD-10-CM | POA: Diagnosis not present

## 2018-01-31 DIAGNOSIS — F341 Dysthymic disorder: Secondary | ICD-10-CM | POA: Diagnosis not present

## 2018-01-31 DIAGNOSIS — F4310 Post-traumatic stress disorder, unspecified: Secondary | ICD-10-CM | POA: Diagnosis not present

## 2018-02-07 DIAGNOSIS — F411 Generalized anxiety disorder: Secondary | ICD-10-CM | POA: Diagnosis not present

## 2018-02-07 DIAGNOSIS — F4310 Post-traumatic stress disorder, unspecified: Secondary | ICD-10-CM | POA: Diagnosis not present

## 2018-02-07 DIAGNOSIS — F341 Dysthymic disorder: Secondary | ICD-10-CM | POA: Diagnosis not present

## 2018-02-07 DIAGNOSIS — F606 Avoidant personality disorder: Secondary | ICD-10-CM | POA: Diagnosis not present

## 2018-02-14 DIAGNOSIS — F411 Generalized anxiety disorder: Secondary | ICD-10-CM | POA: Diagnosis not present

## 2018-02-14 DIAGNOSIS — F4310 Post-traumatic stress disorder, unspecified: Secondary | ICD-10-CM | POA: Diagnosis not present

## 2018-02-14 DIAGNOSIS — F341 Dysthymic disorder: Secondary | ICD-10-CM | POA: Diagnosis not present

## 2018-02-14 DIAGNOSIS — F606 Avoidant personality disorder: Secondary | ICD-10-CM | POA: Diagnosis not present

## 2018-02-15 ENCOUNTER — Other Ambulatory Visit: Payer: Self-pay | Admitting: Obstetrics and Gynecology

## 2018-02-15 DIAGNOSIS — Z1231 Encounter for screening mammogram for malignant neoplasm of breast: Secondary | ICD-10-CM

## 2018-02-21 DIAGNOSIS — F411 Generalized anxiety disorder: Secondary | ICD-10-CM | POA: Diagnosis not present

## 2018-02-21 DIAGNOSIS — F606 Avoidant personality disorder: Secondary | ICD-10-CM | POA: Diagnosis not present

## 2018-02-21 DIAGNOSIS — F4310 Post-traumatic stress disorder, unspecified: Secondary | ICD-10-CM | POA: Diagnosis not present

## 2018-02-21 DIAGNOSIS — F341 Dysthymic disorder: Secondary | ICD-10-CM | POA: Diagnosis not present

## 2018-02-22 ENCOUNTER — Other Ambulatory Visit: Payer: Self-pay

## 2018-02-22 MED ORDER — BUSPIRONE HCL 30 MG PO TABS: 30.0000 mg | ORAL_TABLET | Freq: Two times a day (BID) | ORAL | 5 refills | Status: DC

## 2018-02-23 NOTE — Telephone Encounter (Signed)
Received genetic testing results from [facility]. Copy sent to medical records.   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

## 2018-02-26 NOTE — Telephone Encounter (Addendum)
Send patient a copy of the results and give her a call. This happens quite often, they find something but we don;t know if it is significant ("uncertain significance"). However this is a gene that can cause small-fiber neuropathy which she does have. I would ask her to call the geneticists at [facility] to discuss. thanks

## 2018-02-27 NOTE — Telephone Encounter (Signed)
I called the patient on her mobile # [phone] and LVM (ok per DPR) informing pt that her [facility] test results showed a variant of uncertain significance in a gene. I advised pt that Dr. Jaynee Eagles looked at the result and her recommendation is for the patient to discuss this with the geneticists at [facility] and discuss what they say about the significance. I advised per Dr. Jaynee Eagles this is a gene that can cause small fiber neuropathy which she does have. I asked for a call back from pt and to let us know if she is ok with [place] mailing the results to her. Left office number in message.   When pt calls back, please see if she has any questions about the message. If she does not have questions but agrees to have the results mailed, please confirm her address.

## 2018-02-28 DIAGNOSIS — F606 Avoidant personality disorder: Secondary | ICD-10-CM | POA: Diagnosis not present

## 2018-02-28 DIAGNOSIS — F411 Generalized anxiety disorder: Secondary | ICD-10-CM | POA: Diagnosis not present

## 2018-02-28 DIAGNOSIS — F341 Dysthymic disorder: Secondary | ICD-10-CM | POA: Diagnosis not present

## 2018-02-28 DIAGNOSIS — F4310 Post-traumatic stress disorder, unspecified: Secondary | ICD-10-CM | POA: Diagnosis not present

## 2018-02-28 NOTE — Telephone Encounter (Signed)
I called the pt again and LVM (ok per DPR) regarding the genetic test results. I asked for a call back and left office number.

## 2018-03-01 ENCOUNTER — Encounter: Payer: Self-pay | Admitting: *Deleted

## 2018-03-01 NOTE — Telephone Encounter (Signed)
Sent pt a mychart message. 

## 2018-03-01 NOTE — Telephone Encounter (Signed)
Patient returned my call. She would like the results sent to her via mail. She changed her address. I confirmed the new address as [place], [place] [address] and updated her chart. Pt aware that [facility]'s contact info was sent to her via mychart. Patient verbalized appreciation. [facility] test results mailed to pt at new address.

## 2018-03-07 DIAGNOSIS — F606 Avoidant personality disorder: Secondary | ICD-10-CM | POA: Diagnosis not present

## 2018-03-07 DIAGNOSIS — F341 Dysthymic disorder: Secondary | ICD-10-CM | POA: Diagnosis not present

## 2018-03-07 DIAGNOSIS — F4310 Post-traumatic stress disorder, unspecified: Secondary | ICD-10-CM | POA: Diagnosis not present

## 2018-03-07 DIAGNOSIS — F411 Generalized anxiety disorder: Secondary | ICD-10-CM | POA: Diagnosis not present

## 2018-03-10 DIAGNOSIS — M79672 Pain in left foot: Secondary | ICD-10-CM | POA: Diagnosis not present

## 2018-03-20 ENCOUNTER — Ambulatory Visit
Admission: RE | Admit: 2018-03-20 | Discharge: 2018-03-20 | Disposition: A | Discharge status: Home / Self Care | Payer: BLUE CROSS/BLUE SHIELD | Source: Ambulatory Visit | Attending: Obstetrics and Gynecology | Admitting: Obstetrics and Gynecology

## 2018-03-20 DIAGNOSIS — Z1231 Encounter for screening mammogram for malignant neoplasm of breast: Secondary | ICD-10-CM

## 2018-03-21 DIAGNOSIS — F411 Generalized anxiety disorder: Secondary | ICD-10-CM | POA: Diagnosis not present

## 2018-03-21 DIAGNOSIS — F606 Avoidant personality disorder: Secondary | ICD-10-CM | POA: Diagnosis not present

## 2018-03-21 DIAGNOSIS — F341 Dysthymic disorder: Secondary | ICD-10-CM | POA: Diagnosis not present

## 2018-03-21 DIAGNOSIS — F4310 Post-traumatic stress disorder, unspecified: Secondary | ICD-10-CM | POA: Diagnosis not present

## 2018-03-28 DIAGNOSIS — F606 Avoidant personality disorder: Secondary | ICD-10-CM | POA: Diagnosis not present

## 2018-03-28 DIAGNOSIS — F411 Generalized anxiety disorder: Secondary | ICD-10-CM | POA: Diagnosis not present

## 2018-03-28 DIAGNOSIS — F341 Dysthymic disorder: Secondary | ICD-10-CM | POA: Diagnosis not present

## 2018-03-28 DIAGNOSIS — F4310 Post-traumatic stress disorder, unspecified: Secondary | ICD-10-CM | POA: Diagnosis not present

## 2018-04-04 DIAGNOSIS — F606 Avoidant personality disorder: Secondary | ICD-10-CM | POA: Diagnosis not present

## 2018-04-04 DIAGNOSIS — F341 Dysthymic disorder: Secondary | ICD-10-CM | POA: Diagnosis not present

## 2018-04-04 DIAGNOSIS — F4310 Post-traumatic stress disorder, unspecified: Secondary | ICD-10-CM | POA: Diagnosis not present

## 2018-04-04 DIAGNOSIS — F411 Generalized anxiety disorder: Secondary | ICD-10-CM | POA: Diagnosis not present

## 2018-04-11 DIAGNOSIS — F341 Dysthymic disorder: Secondary | ICD-10-CM | POA: Diagnosis not present

## 2018-04-11 DIAGNOSIS — F411 Generalized anxiety disorder: Secondary | ICD-10-CM | POA: Diagnosis not present

## 2018-04-11 DIAGNOSIS — F606 Avoidant personality disorder: Secondary | ICD-10-CM | POA: Diagnosis not present

## 2018-04-11 DIAGNOSIS — F4310 Post-traumatic stress disorder, unspecified: Secondary | ICD-10-CM | POA: Diagnosis not present

## 2018-04-18 DIAGNOSIS — F341 Dysthymic disorder: Secondary | ICD-10-CM | POA: Diagnosis not present

## 2018-04-18 DIAGNOSIS — F411 Generalized anxiety disorder: Secondary | ICD-10-CM | POA: Diagnosis not present

## 2018-04-18 DIAGNOSIS — F4310 Post-traumatic stress disorder, unspecified: Secondary | ICD-10-CM | POA: Diagnosis not present

## 2018-04-18 DIAGNOSIS — F606 Avoidant personality disorder: Secondary | ICD-10-CM | POA: Diagnosis not present

## 2018-04-25 DIAGNOSIS — F606 Avoidant personality disorder: Secondary | ICD-10-CM | POA: Diagnosis not present

## 2018-04-25 DIAGNOSIS — F341 Dysthymic disorder: Secondary | ICD-10-CM | POA: Diagnosis not present

## 2018-04-25 DIAGNOSIS — F411 Generalized anxiety disorder: Secondary | ICD-10-CM | POA: Diagnosis not present

## 2018-04-25 DIAGNOSIS — F4310 Post-traumatic stress disorder, unspecified: Secondary | ICD-10-CM | POA: Diagnosis not present

## 2018-05-02 DIAGNOSIS — F606 Avoidant personality disorder: Secondary | ICD-10-CM | POA: Diagnosis not present

## 2018-05-02 DIAGNOSIS — F411 Generalized anxiety disorder: Secondary | ICD-10-CM | POA: Diagnosis not present

## 2018-05-02 DIAGNOSIS — F341 Dysthymic disorder: Secondary | ICD-10-CM | POA: Diagnosis not present

## 2018-05-02 DIAGNOSIS — F4310 Post-traumatic stress disorder, unspecified: Secondary | ICD-10-CM | POA: Diagnosis not present

## 2018-05-09 DIAGNOSIS — F606 Avoidant personality disorder: Secondary | ICD-10-CM | POA: Diagnosis not present

## 2018-05-09 DIAGNOSIS — F341 Dysthymic disorder: Secondary | ICD-10-CM | POA: Diagnosis not present

## 2018-05-09 DIAGNOSIS — F411 Generalized anxiety disorder: Secondary | ICD-10-CM | POA: Diagnosis not present

## 2018-05-09 DIAGNOSIS — F4310 Post-traumatic stress disorder, unspecified: Secondary | ICD-10-CM | POA: Diagnosis not present

## 2018-05-16 DIAGNOSIS — F411 Generalized anxiety disorder: Secondary | ICD-10-CM | POA: Diagnosis not present

## 2018-05-16 DIAGNOSIS — F341 Dysthymic disorder: Secondary | ICD-10-CM | POA: Diagnosis not present

## 2018-05-16 DIAGNOSIS — F4310 Post-traumatic stress disorder, unspecified: Secondary | ICD-10-CM | POA: Diagnosis not present

## 2018-05-16 DIAGNOSIS — F606 Avoidant personality disorder: Secondary | ICD-10-CM | POA: Diagnosis not present

## 2018-05-20 ENCOUNTER — Other Ambulatory Visit: Payer: Self-pay | Admitting: Neurology

## 2018-05-23 DIAGNOSIS — F341 Dysthymic disorder: Secondary | ICD-10-CM | POA: Diagnosis not present

## 2018-05-23 DIAGNOSIS — F606 Avoidant personality disorder: Secondary | ICD-10-CM | POA: Diagnosis not present

## 2018-05-23 DIAGNOSIS — F4310 Post-traumatic stress disorder, unspecified: Secondary | ICD-10-CM | POA: Diagnosis not present

## 2018-05-23 DIAGNOSIS — F411 Generalized anxiety disorder: Secondary | ICD-10-CM | POA: Diagnosis not present

## 2018-05-30 DIAGNOSIS — F4310 Post-traumatic stress disorder, unspecified: Secondary | ICD-10-CM | POA: Diagnosis not present

## 2018-05-30 DIAGNOSIS — F606 Avoidant personality disorder: Secondary | ICD-10-CM | POA: Diagnosis not present

## 2018-05-30 DIAGNOSIS — F411 Generalized anxiety disorder: Secondary | ICD-10-CM | POA: Diagnosis not present

## 2018-05-30 DIAGNOSIS — F341 Dysthymic disorder: Secondary | ICD-10-CM | POA: Diagnosis not present

## 2018-06-06 DIAGNOSIS — F411 Generalized anxiety disorder: Secondary | ICD-10-CM | POA: Diagnosis not present

## 2018-06-06 DIAGNOSIS — F4310 Post-traumatic stress disorder, unspecified: Secondary | ICD-10-CM | POA: Diagnosis not present

## 2018-06-06 DIAGNOSIS — F341 Dysthymic disorder: Secondary | ICD-10-CM | POA: Diagnosis not present

## 2018-06-06 DIAGNOSIS — F606 Avoidant personality disorder: Secondary | ICD-10-CM | POA: Diagnosis not present

## 2018-06-07 ENCOUNTER — Other Ambulatory Visit: Payer: Self-pay | Admitting: Psychiatry

## 2018-06-13 DIAGNOSIS — F4310 Post-traumatic stress disorder, unspecified: Secondary | ICD-10-CM | POA: Diagnosis not present

## 2018-06-13 DIAGNOSIS — F411 Generalized anxiety disorder: Secondary | ICD-10-CM | POA: Diagnosis not present

## 2018-06-13 DIAGNOSIS — F606 Avoidant personality disorder: Secondary | ICD-10-CM | POA: Diagnosis not present

## 2018-06-13 DIAGNOSIS — F341 Dysthymic disorder: Secondary | ICD-10-CM | POA: Diagnosis not present

## 2018-06-20 DIAGNOSIS — F4310 Post-traumatic stress disorder, unspecified: Secondary | ICD-10-CM | POA: Diagnosis not present

## 2018-06-20 DIAGNOSIS — F341 Dysthymic disorder: Secondary | ICD-10-CM | POA: Diagnosis not present

## 2018-06-20 DIAGNOSIS — F411 Generalized anxiety disorder: Secondary | ICD-10-CM | POA: Diagnosis not present

## 2018-06-20 DIAGNOSIS — F606 Avoidant personality disorder: Secondary | ICD-10-CM | POA: Diagnosis not present

## 2018-06-27 DIAGNOSIS — F341 Dysthymic disorder: Secondary | ICD-10-CM | POA: Diagnosis not present

## 2018-06-27 DIAGNOSIS — F606 Avoidant personality disorder: Secondary | ICD-10-CM | POA: Diagnosis not present

## 2018-06-27 DIAGNOSIS — F4310 Post-traumatic stress disorder, unspecified: Secondary | ICD-10-CM | POA: Diagnosis not present

## 2018-06-27 DIAGNOSIS — F411 Generalized anxiety disorder: Secondary | ICD-10-CM | POA: Diagnosis not present

## 2018-06-28 ENCOUNTER — Ambulatory Visit: Payer: BLUE CROSS/BLUE SHIELD | Admitting: Psychiatry

## 2018-07-05 ENCOUNTER — Other Ambulatory Visit: Payer: Self-pay | Admitting: Psychiatry

## 2018-07-11 DIAGNOSIS — F4310 Post-traumatic stress disorder, unspecified: Secondary | ICD-10-CM | POA: Diagnosis not present

## 2018-07-11 DIAGNOSIS — F341 Dysthymic disorder: Secondary | ICD-10-CM | POA: Diagnosis not present

## 2018-07-11 DIAGNOSIS — F411 Generalized anxiety disorder: Secondary | ICD-10-CM | POA: Diagnosis not present

## 2018-07-11 DIAGNOSIS — F606 Avoidant personality disorder: Secondary | ICD-10-CM | POA: Diagnosis not present

## 2018-07-18 DIAGNOSIS — F341 Dysthymic disorder: Secondary | ICD-10-CM | POA: Diagnosis not present

## 2018-07-18 DIAGNOSIS — F4310 Post-traumatic stress disorder, unspecified: Secondary | ICD-10-CM | POA: Diagnosis not present

## 2018-07-18 DIAGNOSIS — F411 Generalized anxiety disorder: Secondary | ICD-10-CM | POA: Diagnosis not present

## 2018-07-18 DIAGNOSIS — F606 Avoidant personality disorder: Secondary | ICD-10-CM | POA: Diagnosis not present

## 2018-07-31 ENCOUNTER — Ambulatory Visit: Payer: BC Managed Care – PPO | Admitting: Psychiatry

## 2018-07-31 ENCOUNTER — Encounter: Payer: Self-pay | Admitting: Psychiatry

## 2018-07-31 ENCOUNTER — Other Ambulatory Visit: Payer: Self-pay

## 2018-07-31 DIAGNOSIS — R6889 Other general symptoms and signs: Secondary | ICD-10-CM | POA: Diagnosis not present

## 2018-07-31 DIAGNOSIS — F411 Generalized anxiety disorder: Secondary | ICD-10-CM | POA: Diagnosis not present

## 2018-07-31 DIAGNOSIS — F4310 Post-traumatic stress disorder, unspecified: Secondary | ICD-10-CM

## 2018-07-31 DIAGNOSIS — F331 Major depressive disorder, recurrent, moderate: Secondary | ICD-10-CM | POA: Diagnosis not present

## 2018-07-31 NOTE — Progress Notes (Signed)
Lorraine May [id number] Jun 07, 1955 63 y.o.  Subjective:   Patient ID:  Lorraine May is a 63 y.o. (DOB [birth year]) female.  Chief Complaint:  Chief Complaint  Patient presents with  . Anxiety  . Follow-up    depression    HPI Lorraine May presents to the office today for follow-up of Hx TRD with benefit from [facility] 2016-8 at [facility].   Verty effective.  Last seen December 28, 2017.  no meds were changed.  Decided not to move again and that's a good thing.  Had wondered about reducing the meds but Covid and civil unrest and politics disturbing.  Disturbing over the depth of hatred in the country.  Telehealth is bothersome.  Concerned memory is not as good as in the past and wonders if related to meds.  No sig Xanax.  Sleep ok.  Limiting news.  Stress causes anxiety which causes somatic sx.   Stress caused neuropathy flared.  Feet feels broken and bothers sleep and uncomfortable to walk on it.  Patient reports stable mood and denies depressed or irritable moods.   Patient denies difficulty with sleep initiation or maintenance. Denies appetite disturbance.  Patient reports that energy and motivation have been good.  Patient denies any difficulty with concentration.  Patient denies any suicidal ideation.  Sleep fairly well with gabapentin and naltrexone.  Past Psychiatric Medication Trials:   Nortriptyline side effects, Trintellix loss of benefit, duloxetine, lamotrigine, mirtazapine, venlafaxine side effects, sertraline, Pristiq, what Viibryd, Wellbutrin, Depakote, carbamazepine, Seroquel, Saphris, Geodon, TMS, trazodone no response, buspirone, gabapentin  History of gastric bypass affects the absorption of some meds. Review of Systems:  Review of Systems  Gastrointestinal: Positive for diarrhea.  Musculoskeletal: Positive for arthralgias, back pain, joint swelling and myalgias.  Neurological: Negative for tremors and weakness.       Bruning neuropathy foot and legs   Sx worse with  stress  Medications: I have reviewed the patient's current medications.  Current Outpatient Medications  Medication Sig Dispense Refill  . ALPRAZolam (XANAX) 0.25 MG tablet Take 1 tablet (0.25 mg total) by mouth 2 (two) times daily as needed for anxiety. 15 tablet 1  . busPIRone (BUSPAR) 30 MG tablet Take 1 tablet (30 mg total) by mouth 2 (two) times daily. 60 tablet 5  . colestipol (COLESTID) 1 g tablet Take 1 g by mouth daily.   0  . dicyclomine (BENTYL) 20 MG tablet Take 20 mg by mouth as needed for spasms.     Marland Kitchen gabapentin (NEURONTIN) 100 MG capsule TAKE 2 CAPSULES BY MOUTH IN THE MORNING, 2 AT MIDDAY AND 3 IN THE EVENING 630 capsule 1  . lamoTRIgine (LAMICTAL) 100 MG tablet TAKE 1 TABLET BY MOUTH TWICE DAILY 60 tablet 1  . NALTREXONE HCL PO Take 4.5 mg by mouth at bedtime.    . valACYclovir (VALTREX) 500 MG tablet Take 4 tablets by mouth. Take 4 tablets and then another 4 tablets 12 hours later as needed for fever blister  0  . Vitamin D, Ergocalciferol, (DRISDOL) 50000 units CAPS capsule TK 1 C PO ONCE A WK FOR 3 WKS A MONTH  2  . simvastatin (ZOCOR) 40 MG tablet TK 1 T PO IN THE EVENING  4  . zolpidem (AMBIEN) 5 MG tablet TAKE 1 TABLET BY MOUTH EVERY DAY AT BEDTIME AS NEEDED FOR INSOMNIA (Patient taking differently: 2.5 mg. ) 30 tablet 1   No current facility-administered medications for this visit.     Medication Side Effects:  None  Allergies:  Allergies  Allergen Reactions  . Demeclocycline Nausea Only  . Dilaudid [Hydromorphone Hcl] Itching  . Fentanyl Itching  . Morphine And Related Itching  . Nsaids     Cannot take due to kidney disease  . Percocet [Oxycodone-Acetaminophen]     itching  . Tetracyclines & Related Nausea Only  . Sulfa Antibiotics Rash    Past Medical History:  Diagnosis Date  . Anxiety   . Anxiety and depression 08/24/2011  . Arthritis   . Cancer ([place])    colon  . Depression   . Hx of colon cancer, stage II 08/24/2011   T3N0  adenoca cecum  resected 04/2004  Xeloda adjuvant chemo  . Hyperlipidemia 08/24/2011  . Joint pain   . Nausea & vomiting   . Neuropathy   . PONV (postoperative nausea and vomiting)   . Superficial basal cell carcinoma (BCC) 09/18/2012   Right Temple    Family History  Problem Relation Age of Onset  . Diabetes Mother        type 1  . Stroke Mother   . Dementia Mother   . Thyroid disease Mother   . Alcohol abuse Mother   . Asthma Mother   . Alcohol abuse Father   . Diabetes Father   . Cancer Paternal Aunt        germ cell  . Cancer Maternal Grandmother        colon  . Cancer Maternal Uncle        throat  . Thyroid disease Sister   . Alcohol abuse Brother   . Psychiatric Illness Brother   . Psychiatric Illness Sister   . Asthma Sister   . Neuropathy Neg Hx     Social History   Socioeconomic History  . Marital status: Married    Spouse name: Herbie [place]  . Number of children: 1  . Years of education: 66  . Highest education level: Not on file  Occupational History  . Occupation: Press photographer- Retired  Scientific laboratory technician  . Financial resource strain: Not on file  . Food insecurity    Worry: Not on file    Inability: Not on file  . Transportation needs    Medical: Not on file    Non-medical: Not on file  Tobacco Use  . Smoking status: Never Smoker  . Smokeless tobacco: Never Used  Substance and Sexual Activity  . Alcohol use: Yes    Comment: occ  . Drug use: No  . Sexual activity: Not on file  Lifestyle  . Physical activity    Days per week: Not on file    Minutes per session: Not on file  . Stress: Not on file  Relationships  . Social Herbalist on phone: Not on file    Gets together: Not on file    Attends religious service: Not on file    Active member of club or organization: Not on file    Attends meetings of clubs or organizations: Not on file    Relationship status: Not on file  . Intimate partner violence    Fear of current or ex partner: Not on file     Emotionally abused: Not on file    Physically abused: Not on file    Forced sexual activity: Not on file  Other Topics Concern  . Not on file  Social History Narrative   Lives with husband   Caffeine use: 1 cup daily   Right handed  Past Medical History, Surgical history, Social history, and Family history were reviewed and updated as appropriate.   Please see review of systems for further details on the patient's review from today.   Objective:   Physical Exam:  There were no vitals taken for this visit.  Physical Exam Constitutional:      General: She is not in acute distress.    Appearance: She is well-developed.  Musculoskeletal:        General: No deformity.  Neurological:     Mental Status: She is alert and oriented to person, place, and time.     Motor: No tremor.     Coordination: Coordination normal.     Gait: Gait normal.  Psychiatric:        Attention and Perception: She does not perceive auditory hallucinations.        Mood and Affect: Mood is anxious. Mood is not depressed. Affect is not labile, blunt, angry or inappropriate.        Speech: Speech normal.        Behavior: Behavior normal.        Thought Content: Thought content normal. Thought content does not include homicidal or suicidal ideation. Thought content does not include homicidal or suicidal plan.        Judgment: Judgment normal.     Comments: Insight intact. No auditory or visual hallucinations.  Somatic under stress.     Lab Review:     Component Value Date/Time   NA 140 02/02/2017 1554   NA 142 08/21/2013 1445   K 5.2 02/02/2017 1554   K 4.1 08/21/2013 1445   CL 102 02/02/2017 1554   CO2 24 02/02/2017 1554   CO2 26 08/21/2013 1445   GLUCOSE 97 02/02/2017 1554   GLUCOSE 120 (H) 02/07/2014 1107   GLUCOSE 91 08/21/2013 1445   BUN 19 02/02/2017 1554   BUN 18.7 08/21/2013 1445   CREATININE 1.17 (H) 02/02/2017 1554   CREATININE 1.1 08/21/2013 1445   CALCIUM 9.3 02/02/2017 1554    CALCIUM 9.1 08/21/2013 1445   PROT 6.6 03/03/2016 1124   PROT 6.5 08/21/2013 1445   ALBUMIN 4.7 02/07/2014 1107   ALBUMIN 3.7 08/21/2013 1445   AST 21 02/07/2014 1107   AST 12 08/21/2013 1445   ALT 14 02/07/2014 1107   ALT 11 08/21/2013 1445   ALKPHOS 64 02/07/2014 1107   ALKPHOS 56 08/21/2013 1445   BILITOT 0.6 02/07/2014 1107   BILITOT 0.27 08/21/2013 1445   GFRNONAA 50 (L) 02/02/2017 1554   GFRAA 57 (L) 02/02/2017 1554       Component Value Date/Time   WBC 6.3 02/07/2014 1107   RBC 4.28 02/07/2014 1107   HGB 12.9 02/07/2014 1107   HGB 12.3 08/21/2013 1445   HCT 38.7 02/07/2014 1107   HCT 37.2 08/21/2013 1445   PLT 253 02/07/2014 1107   PLT 226 08/21/2013 1445   MCV 90.4 02/07/2014 1107   MCV 93.2 08/21/2013 1445   MCH 30.1 02/07/2014 1107   MCHC 33.3 02/07/2014 1107   RDW 13.2 02/07/2014 1107   RDW 13.8 08/21/2013 1445   LYMPHSABS 1.5 08/21/2013 1445   MONOABS 0.3 08/21/2013 1445   EOSABS 0.1 08/21/2013 1445   BASOSABS 0.0 08/21/2013 1445    No results found for: POCLITH, LITHIUM   No results found for: PHENYTOIN, PHENOBARB, VALPROATE, CBMZ   .res Assessment: Plan:    Lorraine May was seen today for anxiety and follow-up.  Diagnoses and all orders for this visit:  Generalized anxiety disorder  Major depressive disorder, recurrent episode, moderate (HCC)  PTSD (post-traumatic stress disorder)  Forgetfulness    As can be seen the patient is failed a long list of psychiatric medications.  Fortunately she did respond to [place] for depression.  Cont meds that are working and tolerating well.  Maintained the TMS benefit for depression.  Marland Kitchen    Anxiety symptoms are manageable.  She does have significant residual symptoms which contribute to somatic symptoms and this is chronic.  Asks me to RX low dose naltrexone being used off label for neuropathy.  Reasonable but could not figure out how to do it in Epic.  Has continued telehealth with therapist Alvester Chou  PHD.  OK trial reduction lamotrigine to 1/2 tablet in the morning and 1 in PM to see if memory is better. Discussed the risk of relapse and if it occurs to increase the medication back to the full dosage.  NAC N-acetylcysteine 600 mg daily.  Off label for cognitive complaints including word-finding and STM issues.  This appt was 30 mins.  FU 6 mos.  Lynder Parents, MD, DFAPA   Please see After Visit Summary for patient specific instructions.  No future appointments.  No orders of the defined types were placed in this encounter.     -------------------------------

## 2018-07-31 NOTE — Patient Instructions (Addendum)
NAC N-acetylcysteine 600 mg daily.  OK trial reduction lamotrigine to 1/2 tablet in the morning and 1 in PM to see if memory is better.

## 2018-08-01 DIAGNOSIS — F411 Generalized anxiety disorder: Secondary | ICD-10-CM | POA: Diagnosis not present

## 2018-08-01 DIAGNOSIS — F606 Avoidant personality disorder: Secondary | ICD-10-CM | POA: Diagnosis not present

## 2018-08-01 DIAGNOSIS — F4310 Post-traumatic stress disorder, unspecified: Secondary | ICD-10-CM | POA: Diagnosis not present

## 2018-08-01 DIAGNOSIS — F341 Dysthymic disorder: Secondary | ICD-10-CM | POA: Diagnosis not present

## 2018-08-07 ENCOUNTER — Telehealth: Payer: Self-pay | Admitting: Psychiatry

## 2018-08-07 NOTE — Telephone Encounter (Signed)
Patient called and said that when she saw dr. Clovis Pu last week they discussed a new medicine that needed to be prescribed that he has never written before. Please call [facility] to get info. [phone]

## 2018-08-08 ENCOUNTER — Other Ambulatory Visit: Payer: Self-pay

## 2018-08-08 ENCOUNTER — Other Ambulatory Visit: Payer: Self-pay | Admitting: Physician Assistant

## 2018-08-08 DIAGNOSIS — F606 Avoidant personality disorder: Secondary | ICD-10-CM | POA: Diagnosis not present

## 2018-08-08 DIAGNOSIS — F341 Dysthymic disorder: Secondary | ICD-10-CM | POA: Diagnosis not present

## 2018-08-08 DIAGNOSIS — F411 Generalized anxiety disorder: Secondary | ICD-10-CM | POA: Diagnosis not present

## 2018-08-08 DIAGNOSIS — F4310 Post-traumatic stress disorder, unspecified: Secondary | ICD-10-CM | POA: Diagnosis not present

## 2018-08-08 NOTE — Telephone Encounter (Signed)
Left voicemail to call back with her dosage and which pharmacy to be sent to.

## 2018-08-08 NOTE — Telephone Encounter (Signed)
I can't figure out how to send in the eRx either.  Can you call the pharm and give Rx for Naltrexone 1.5mg  qd.  I'm not sure if it's a liquid, pill, or what, but ok enough for 30 days. RF 1.

## 2018-08-09 ENCOUNTER — Other Ambulatory Visit: Payer: Self-pay | Admitting: Physician Assistant

## 2018-08-09 NOTE — Telephone Encounter (Signed)
No problem that will be much easier. Thank you

## 2018-08-09 NOTE — Telephone Encounter (Signed)
Traci, I can't figure out how to send that in Epic.  Please call it in, #90 w/ no RF.  Sorry about that.  I tried!

## 2018-08-09 NOTE — Telephone Encounter (Signed)
Thanks

## 2018-08-14 DIAGNOSIS — E7800 Pure hypercholesterolemia, unspecified: Secondary | ICD-10-CM | POA: Diagnosis not present

## 2018-08-14 DIAGNOSIS — Z0000 Encounter for general adult medical examination without abnormal findings: Secondary | ICD-10-CM | POA: Diagnosis not present

## 2018-08-14 DIAGNOSIS — E538 Deficiency of other specified B group vitamins: Secondary | ICD-10-CM | POA: Diagnosis not present

## 2018-08-14 DIAGNOSIS — Z5181 Encounter for therapeutic drug level monitoring: Secondary | ICD-10-CM | POA: Diagnosis not present

## 2018-08-14 DIAGNOSIS — E559 Vitamin D deficiency, unspecified: Secondary | ICD-10-CM | POA: Diagnosis not present

## 2018-08-15 ENCOUNTER — Other Ambulatory Visit: Payer: Self-pay | Admitting: Psychiatry

## 2018-08-15 DIAGNOSIS — F606 Avoidant personality disorder: Secondary | ICD-10-CM | POA: Diagnosis not present

## 2018-08-15 DIAGNOSIS — F4310 Post-traumatic stress disorder, unspecified: Secondary | ICD-10-CM | POA: Diagnosis not present

## 2018-08-15 DIAGNOSIS — F411 Generalized anxiety disorder: Secondary | ICD-10-CM | POA: Diagnosis not present

## 2018-08-27 ENCOUNTER — Other Ambulatory Visit: Payer: Self-pay | Admitting: Psychiatry

## 2018-08-29 DIAGNOSIS — F606 Avoidant personality disorder: Secondary | ICD-10-CM | POA: Diagnosis not present

## 2018-08-29 DIAGNOSIS — F411 Generalized anxiety disorder: Secondary | ICD-10-CM | POA: Diagnosis not present

## 2018-08-29 DIAGNOSIS — F4310 Post-traumatic stress disorder, unspecified: Secondary | ICD-10-CM | POA: Diagnosis not present

## 2018-09-12 DIAGNOSIS — F411 Generalized anxiety disorder: Secondary | ICD-10-CM | POA: Diagnosis not present

## 2018-09-12 DIAGNOSIS — F4310 Post-traumatic stress disorder, unspecified: Secondary | ICD-10-CM | POA: Diagnosis not present

## 2018-09-15 DIAGNOSIS — Z23 Encounter for immunization: Secondary | ICD-10-CM | POA: Diagnosis not present

## 2018-09-26 DIAGNOSIS — F411 Generalized anxiety disorder: Secondary | ICD-10-CM | POA: Diagnosis not present

## 2018-09-26 DIAGNOSIS — F4310 Post-traumatic stress disorder, unspecified: Secondary | ICD-10-CM | POA: Diagnosis not present

## 2018-10-10 DIAGNOSIS — F4310 Post-traumatic stress disorder, unspecified: Secondary | ICD-10-CM | POA: Diagnosis not present

## 2018-10-10 DIAGNOSIS — F411 Generalized anxiety disorder: Secondary | ICD-10-CM | POA: Diagnosis not present

## 2018-10-23 DIAGNOSIS — Z9049 Acquired absence of other specified parts of digestive tract: Secondary | ICD-10-CM | POA: Diagnosis not present

## 2018-10-23 DIAGNOSIS — R197 Diarrhea, unspecified: Secondary | ICD-10-CM | POA: Diagnosis not present

## 2018-10-23 DIAGNOSIS — Z85038 Personal history of other malignant neoplasm of large intestine: Secondary | ICD-10-CM | POA: Diagnosis not present

## 2018-10-24 DIAGNOSIS — F4310 Post-traumatic stress disorder, unspecified: Secondary | ICD-10-CM | POA: Diagnosis not present

## 2018-10-24 DIAGNOSIS — F411 Generalized anxiety disorder: Secondary | ICD-10-CM | POA: Diagnosis not present

## 2018-11-01 DIAGNOSIS — Z6829 Body mass index (BMI) 29.0-29.9, adult: Secondary | ICD-10-CM | POA: Diagnosis not present

## 2018-11-01 DIAGNOSIS — Z01419 Encounter for gynecological examination (general) (routine) without abnormal findings: Secondary | ICD-10-CM | POA: Diagnosis not present

## 2018-11-01 DIAGNOSIS — Z23 Encounter for immunization: Secondary | ICD-10-CM | POA: Diagnosis not present

## 2018-11-03 DIAGNOSIS — R300 Dysuria: Secondary | ICD-10-CM | POA: Diagnosis not present

## 2018-11-06 ENCOUNTER — Other Ambulatory Visit: Payer: Self-pay | Admitting: Psychiatry

## 2018-11-06 ENCOUNTER — Other Ambulatory Visit: Payer: Self-pay | Admitting: Neurology

## 2018-11-06 NOTE — Telephone Encounter (Signed)
Next appt 08/07/2019 Refills?

## 2018-11-07 ENCOUNTER — Telehealth: Payer: Self-pay | Admitting: Psychiatry

## 2018-11-07 ENCOUNTER — Other Ambulatory Visit: Payer: Self-pay

## 2018-11-07 DIAGNOSIS — F4310 Post-traumatic stress disorder, unspecified: Secondary | ICD-10-CM | POA: Diagnosis not present

## 2018-11-07 DIAGNOSIS — F411 Generalized anxiety disorder: Secondary | ICD-10-CM | POA: Diagnosis not present

## 2018-11-07 MED ORDER — ZOLPIDEM TARTRATE 5 MG PO TABS: ORAL_TABLET | ORAL | 2 refills | Status: DC

## 2018-11-07 NOTE — Telephone Encounter (Signed)
Patient called and said that the pharmacy called and said that the pharmacy called her and said that we denied her refill of her naltrexone hcl 4.5 mg. Dr. Clovis Pu prescribed it to her in July  She gets quanitity of 90. It has to be compunded so [phone]. She is leaving on Saturday to go out of town and will ned this medicine

## 2018-11-07 NOTE — Telephone Encounter (Signed)
Okay to call in. 

## 2018-11-08 NOTE — Telephone Encounter (Signed)
Called and spoke with pharmacist and explained the computer system didn't recognize that dose. Let them know okay to fill and put refills on file.

## 2018-11-09 DIAGNOSIS — H524 Presbyopia: Secondary | ICD-10-CM | POA: Diagnosis not present

## 2018-11-09 DIAGNOSIS — H2511 Age-related nuclear cataract, right eye: Secondary | ICD-10-CM | POA: Diagnosis not present

## 2018-11-09 DIAGNOSIS — D3132 Benign neoplasm of left choroid: Secondary | ICD-10-CM | POA: Diagnosis not present

## 2018-11-09 DIAGNOSIS — H25042 Posterior subcapsular polar age-related cataract, left eye: Secondary | ICD-10-CM | POA: Diagnosis not present

## 2018-12-05 DIAGNOSIS — F411 Generalized anxiety disorder: Secondary | ICD-10-CM | POA: Diagnosis not present

## 2018-12-05 DIAGNOSIS — F4310 Post-traumatic stress disorder, unspecified: Secondary | ICD-10-CM | POA: Diagnosis not present

## 2018-12-11 ENCOUNTER — Telehealth: Payer: Self-pay | Admitting: Neurology

## 2018-12-11 NOTE — Telephone Encounter (Signed)
Can cel Sydell's appointment Thursday, she saw Amy this was likely made a year ago thanks

## 2018-12-12 NOTE — Telephone Encounter (Signed)
Noted, will keep appt as is.

## 2018-12-12 NOTE — Telephone Encounter (Signed)
Sorry, yes she has not seen Amy. Disregard. thanks

## 2018-12-14 ENCOUNTER — Other Ambulatory Visit: Payer: Self-pay

## 2018-12-14 ENCOUNTER — Encounter: Payer: Self-pay | Admitting: Neurology

## 2018-12-14 ENCOUNTER — Ambulatory Visit (INDEPENDENT_AMBULATORY_CARE_PROVIDER_SITE_OTHER): Payer: BC Managed Care – PPO | Admitting: Neurology

## 2018-12-14 VITALS — BP 128/79 | HR 71 | Temp 97.7°F | Ht 64.5 in | Wt 176.0 lb

## 2018-12-14 DIAGNOSIS — G629 Polyneuropathy, unspecified: Secondary | ICD-10-CM | POA: Diagnosis not present

## 2018-12-14 MED ORDER — GABAPENTIN 100 MG PO CAPS: ORAL_CAPSULE | ORAL | 4 refills | Status: DC

## 2018-12-14 NOTE — Progress Notes (Signed)
[facility]    Provider:  Dr Jaynee Eagles Referring Provider: No ref. provider found Primary Care Physician:  Maurice Small, MD  CC: Neuropathy, unexplained spasticity, acute onset vertigo  12/14/2018: vertigo resolved. She still has neuropathic pain in the feet, Gabapentin helps but causes cognitive problems. We had a long conversation about options, we also discussed botox for neuropathy and reviewed literature, picture online and You-tube video. We will try that with samples and if it helps we can try and get insurance to approve. Still very spastic, unclear etiology. In the past emg/ncs, imaging of entire neuroaxis, genetic testing, skin biopsy performed for small-fiber neuropathy.  08/2017: She is having episodes of BPPV. Discussed etiology, she went to vestibular therapy last year but not doing exercises at home. On the right side. Mostly in the morning but also at night. No nausea or vomiting. Happening daily when in bed turning. Affecting her balance. Discussed BPPV, etiology and watched a you tube video and then practiced the procedure. Discussed genetic testing risks and benefits, risk finding abnormalities and not knowing if significant, they have genetic counselors, also future impact on insurance and other factors. She would like to go forward. She was started on Naltrexone 4.5mg  at bedtime and feels it is helping neuropathy.   Interval history 02/02/2017: Patient is here for follow up of vertigo.  She was on the massage table to the right she had spinning. She couldn't walk or keep balance. She still has vertigo. Happens when she moves her head. She gets massages all the time. It was gentle turning forceful and not an "adjustment".   But the vertigo was acute as soon as the massage therapist manipulated her head position. Discuss concern for vertebral dissection and stroke.   Interval history 07/02/2016: Patient is here for follow-up of biopsy proven small fiber neuropathy.  Extensive testing has been unremarkable including SPEP, ANA, sedimentation rate, TSH, CMP, B12, vitamin D, hemoglobin A1c (per pcp notes and unremarkable) and here in our clinic: vitamin B1, CRP, Lyme, B6, heavy metals, rheumatoid factor,pan-anca, Sjogren's, angiotensin-converting enzyme, BMP. Will check b12 and hgba1c again.  She had chemotherapy with a platinum-based drug. She was tested for celiac disease in 2016 which was negative.   Interval history 03/03/2016: Here for a new problem today burning in the legs. She has new tingling in her feet. She saw a dermatologist for the rash and he gave her some topical steroids. The rash is better. The tingling is still there. She has swelling in her right foot. She has incontinence. Started a few months ago. Thought it was dry skin. Burning and tingling. The whole foot feels hot both of them. No inciting events. No new medications. Nothing new. Feels swollen behind the knees. Not bad at night, not worse at night. Symptoms are worsening, she is noticing it more. Continuous. No weakness. No change in back pain. No cramping, hands not involved. Nothing makes it better or worse, standing too long may make it worse.   Labs tested: spep, ANA, sed rate, TSH, CMP, B12, Vit D, hgba1c, urinalysis  Interval history: MRI of the brain and thoracic spine were unremarkable. A year ago she pushed a box with her foot, pain exploded up her leg, thought she pulled a muscle, she was diagnosed with bursitis, She saw orthopaedics and diagnosed with bursitis again, an injection made her whole leg go numb, She has hip and lower back pain. She has done physical therapy. [facility] had [facility] [place]  months. She is stretching and strengthening. She has lower back pain with radiation down both legs. She has continued weakness.   [id number] Vocciis a very nice 63 y.o.femalehere as a referral from Dr. Val Eagle chronic headaches. PMHx major depression, generalized  anxiety, headaches, PTSD, Stage 3 kidney disease, Osteopenia, colon caner s/p right hemicolectomy 2006. She has headaches for a year, no inciting events or head trauma but she was trying all kinds "loads" of medications for her mood disorder. Never had headaches int he past or any history of migraines. She has multiple types of headaches, occasionally will get ice pick in the right temporal area which occurs every several weeks and is brief, no eye watering or nose running but very painful, no time of day preference. She has a headache right now on the top of her head and behind the eyes, she saw an ophthalmologist who sent her stat for temporal arteritis but labwork was negative. 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 The headache is a pressure headache, more pressure and pain, consistent and continuous over 4-5 hours and a few tylenol help, she takes tylenol 3-4 times a week. She had TMS therapy for depression last year and this resulted in headaches. Headaches most days of the week,she wakes up with headaches. She snores mildly. She had an MRI of the brain but may have started after the MRI last April. The clonidine was started about the same time as the headaches. She has no significant daytime somnolence and no other signs of obstructive sleep apnea. No other focal neurologic deficits or complaints. No double vision, no aphasia, no dysarthria, no weakness, no sensory changes. No bowel or bladder changes. No fever or systemic symptoms. She also has a lot of tightness in the neck associated with headaches.  Reviewed notes, labs and imaging from outside physicians, which showed: Patient brings in a CD with the images, personally reviewed. MRI of the cervical spine was largely unremarkable, at C4-C5 minimal central canal and moderate right foraminal stenosis. At C5-C6 minimal central canal and moderate right foraminal stenosis.  Patient was referred by [facility] orthopedics. She is seeing them for neck pain.  Neck Stiffness and headaches. Symptoms exacerbated by turning the head to the right and turning the head to the left. Treated with non-opioid analgesics. Patient also reported low back pain. Mild neck discomfort, worse with range of motion and deep palpation. Exam significant for 10 beats of clonus in the lower extremities bilaterally, 3+ brisk deep tendon reflexes at the knees, Achilles, biceps, triceps and brachial radialis. Toes are flexor. Negative Hoffman sign. Normal gait pattern. Slight limp when she walks because of right-sided gluteal and lateral hip pain that she is not ataxic and she has no significant imbalance. Negative Romberg. MRI reviewed showed no cord signal changes. Mild multilevel degenerative disease no significant high-grade neural compression. No evidence to suggest cervical spondylitic myelopathy.  BMP unremarkable creatinine slightly elevated 1.02, CBC normal     Social History   Socioeconomic History  . Marital status: Married    Spouse name: Herbie Baltimore  . Number of children: 1  . Years of education: 21  . Highest education level: Not on file  Occupational History  . Occupation: Press photographer- Retired  Scientific laboratory technician  . Financial resource strain: Not on file  . Food insecurity    Worry: Not on file    Inability: Not on file  . Transportation needs    Medical: Not on file    Non-medical: Not on file  Tobacco Use  . Smoking status: Never Smoker  . Smokeless tobacco: Never Used  Substance and Sexual Activity  . Alcohol use: Yes    Comment: occ  . Drug use: No  . Sexual activity: Not on file  Lifestyle  . Physical activity    Days per week: Not on file    Minutes per session: Not on file  . Stress: Not on file  Relationships  . Social Herbalist on phone: Not on file    Gets together: Not on file    Attends religious service: Not on file    Active member of club or organization: Not on file    Attends meetings of clubs or organizations: Not on file     Relationship status: Not on file  . Intimate partner violence    Fear of current or ex partner: Not on file    Emotionally abused: Not on file    Physically abused: Not on file    Forced sexual activity: Not on file  Other Topics Concern  . Not on file  Social History Narrative   Lives with husband   Caffeine use: 2-3 cups daily   Right handed    Family History  Problem Relation Age of Onset  . Diabetes Mother        type 1  . Stroke Mother   . Dementia Mother   . Thyroid disease Mother   . Alcohol abuse Mother   . Asthma Mother   . Alcohol abuse Father   . Diabetes Father   . Cancer Paternal Aunt        germ cell  . Cancer Maternal Grandmother        colon  . Cancer Maternal Uncle        throat  . Thyroid disease Sister   . Alcohol abuse Brother   . Psychiatric Illness Brother   . Psychiatric Illness Sister   . Asthma Sister   . Neuropathy Neg Hx     Past Medical History:  Diagnosis Date  . Anxiety   . Anxiety and depression 08/24/2011  . Arthritis   . Cancer ([place])    colon  . Depression   . Hx of colon cancer, stage II 08/24/2011   T3N0  adenoca cecum resected 04/2004  Xeloda adjuvant chemo  . Hyperlipidemia 08/24/2011  . Joint pain   . Nausea & vomiting   . Neuropathy   . PONV (postoperative nausea and vomiting)   . Superficial basal cell carcinoma ([facility]CC) 09/18/2012   Right [place]    Past Surgical History:  Procedure Laterality Date  . ABDOMINAL HYSTERECTOMY  2004  . BREAST SURGERY  2011   reduction  . CHOLECYSTECTOMY  10/05/2011   Procedure: LAPAROSCOPIC CHOLECYSTECTOMY WITH INTRAOPERATIVE CHOLANGIOGRAM;  Surgeon: Stark Klein, MD;  Location: [facility];  Service: General;  Laterality: N/A;  . [place]  . HEMICOLECTOMY Right 2006  . HERNIA REPAIR  2007  . KNEE ARTHROSCOPY  2010  . NASAL SEPTUM SURGERY  1983  . OTHER SURGICAL HISTORY  2016   Transcranial magnetic stimulation  . PORT-A-CATH REMOVAL  2006   insertion and removal   . REDUCTION MAMMAPLASTY Bilateral 2010  . STRABISMUS SURGERY  1962    Current Outpatient Medications  Medication Sig Dispense Refill  . ALPRAZolam (XANAX) 0.25 MG tablet TAKE 1 TABLET(0.25 MG) BY MOUTH TWICE DAILY AS NEEDED FOR ANXIETY 15 tablet 1  . busPIRone (BUSPAR) 30 MG tablet TAKE  1 TABLET(30 MG) BY MOUTH TWICE DAILY 60 tablet 5  . colestipol (COLESTID) 1 g tablet Take 1 g by mouth daily.   0  . dicyclomine (BENTYL) 20 MG tablet Take 20 mg by mouth as needed for spasms.     Marland Kitchen gabapentin (NEURONTIN) 100 MG capsule TAKE 2 CAPSULES BY MOUTH IN THE MORNING, 2 AT MIDDAY AND 3 IN THE EVENING 630 capsule 4  . lamoTRIgine (LAMICTAL) 100 MG tablet TAKE 1 TABLET BY MOUTH TWICE DAILY 60 tablet 11  . NALTREXONE HCL PO Take 4.5 mg by mouth at bedtime.    . simvastatin (ZOCOR) 40 MG tablet TK 1 T PO IN THE EVENING  4  . valACYclovir (VALTREX) 500 MG tablet Take 4 tablets by mouth. Take 4 tablets and then another 4 tablets 12 hours later as needed for fever blister  0  . Vitamin D, Ergocalciferol, (DRISDOL) 50000 units CAPS capsule TK 1 C PO ONCE A WK FOR 3 WKS A MONTH  2  . zolpidem (AMBIEN) 5 MG tablet TAKE 1 TABLET BY MOUTH EVERY DAY AT BEDTIME AS NEEDED FOR INSOMNIA 30 tablet 2   No current facility-administered medications for this visit.     Allergies as of 12/14/2018 - Review Complete 12/14/2018  Allergen Reaction Noted  . Demeclocycline Nausea Only 08/24/2011  . Dilaudid [hydromorphone hcl] Itching 08/24/2011  . Fentanyl Itching 08/22/2012  . Morphine and related Itching 08/24/2011  . Nsaids  08/13/2016  . Percocet [oxycodone-acetaminophen]  08/21/2013  . Tetracyclines & related Nausea Only 08/24/2011  . Sulfa antibiotics Rash 08/24/2011    Vitals: BP 128/79 (BP Location: Right Arm, Patient Position: Sitting)   Pulse 71   Temp 97.7 F (36.5 C) Comment: taken at front door  Ht 5' 4.5" (1.638 m)   Wt 176 lb (79.8 kg)   BMI 29.74 kg/m  Last Weight:  Wt Readings from Last 1  Encounters:  12/14/18 176 lb (79.8 kg)   Last Height:   Ht Readings from Last 1 Encounters:  12/14/18 5' 4.5" (1.638 m)    Strength intact. No significant decrease in any sensory modality distally in the extremities. Gait normal. Brisk reflexes with clonus in the lower extremities, toes downgoing.   Assessment/Plan:Very nice 63 y.o. female here as a referral from Dr. Rolena Infante for chronic headaches and low back pain. PMHx major depression, generalized anxiety, headaches, PTSD, Stage 3 kidney disease, Osteopenia, colon caner s/p right hemicolectomy 2006. Patient also has some interesting physical findings including bilateral lower extremity clonus at the patellars and the ankle jerks but imaging of the brain, cervical and thoracic spine unremarkable for UMN disease may be genetic or neurodegenerative.  vertigo resolved. She still has neuropathic pain in the feet, Gabapentin helps but causes cognitive problems. We had a long conversation about options, we also discussed botox for neuropathy and reviewed literature, picture online and You-tube video. We will try that with samples and if it helps we can try and get insurance to approve. Still very spastic, unclear etiology. In the past emg/ncs, imaging of entire neuroaxis, genetic testing, skin biopsy performed for small-fiber neuropathy. She will email [place] when she is ready to make an appointment.   PRIOR:  Vertigo during a massage with therapist moving her head to the right, vertigo continues, need MRI of the brain and MRA of the head and neck looking for dissection and stroke. All was negative. Likely BPPV but sister with meniere's will refer to dr Ernesto Rutherford.   Spasticity and clonus with  negative MRI of the neuro axis: May be degenerative or genetic. Will refer to [facility]romuscular specialist at [facility]. Follow clinically.   She has biopsy proven small-fiber neuropathy, discussed findings and reviewed biopsy results,  most significant risk  factor found so far is previous platinum-based chemotherapy. Had a long discussion about small-fiber neuropathy, causes and progression. Discussed the causes of peripheral neuropathy, the most common being diabetes which patient does not report having. About 20 million people in the [place] states have some form of peripheral neuropathy. This is a condition that develops as a result of damage to the peripheral nervous system.  There are multiple causes eluding metabolic, toxic, infectious and endocrine disorders, small vessel disease, autoimmune diseases, and others. We have  performed extensive serum neuropathy screening as well as an EMG nerve conduction study. Will order genetic testing.    Vertigo: Performed epley maneuvers, discussed and watched video on the procedure. She has been to vestibular therapy in the past. Will refer to Dr. Ernesto Rutherford, sister has Menier's  Sarina Ill, MD  [facility] [address] [place], [place] [address]  Phone [phone] Fax [phone]  A total of 70minutes was spent face-to-face with this patient. Over half this time was spent on counseling patient on the  1. Small fiber neuropathy     diagnosis and different diagnostic and therapeutic options, counseling and coordination of care, risks ans benefits of management, compliance, or risk factor reduction and education.

## 2018-12-17 ENCOUNTER — Encounter: Payer: Self-pay | Admitting: Neurology

## 2018-12-17 DIAGNOSIS — G629 Polyneuropathy, unspecified: Secondary | ICD-10-CM | POA: Insufficient documentation

## 2018-12-19 DIAGNOSIS — Z1159 Encounter for screening for other viral diseases: Secondary | ICD-10-CM | POA: Diagnosis not present

## 2018-12-19 DIAGNOSIS — F4310 Post-traumatic stress disorder, unspecified: Secondary | ICD-10-CM | POA: Diagnosis not present

## 2018-12-19 DIAGNOSIS — F411 Generalized anxiety disorder: Secondary | ICD-10-CM | POA: Diagnosis not present

## 2018-12-22 DIAGNOSIS — D124 Benign neoplasm of descending colon: Secondary | ICD-10-CM | POA: Diagnosis not present

## 2018-12-22 DIAGNOSIS — D125 Benign neoplasm of sigmoid colon: Secondary | ICD-10-CM | POA: Diagnosis not present

## 2018-12-22 DIAGNOSIS — K5730 Diverticulosis of large intestine without perforation or abscess without bleeding: Secondary | ICD-10-CM | POA: Diagnosis not present

## 2018-12-22 DIAGNOSIS — K635 Polyp of colon: Secondary | ICD-10-CM | POA: Diagnosis not present

## 2018-12-22 DIAGNOSIS — Z85038 Personal history of other malignant neoplasm of large intestine: Secondary | ICD-10-CM | POA: Diagnosis not present

## 2018-12-22 DIAGNOSIS — K640 First degree hemorrhoids: Secondary | ICD-10-CM | POA: Diagnosis not present

## 2019-01-09 DIAGNOSIS — F4310 Post-traumatic stress disorder, unspecified: Secondary | ICD-10-CM | POA: Diagnosis not present

## 2019-01-09 DIAGNOSIS — F411 Generalized anxiety disorder: Secondary | ICD-10-CM | POA: Diagnosis not present

## 2019-01-16 DIAGNOSIS — F411 Generalized anxiety disorder: Secondary | ICD-10-CM | POA: Diagnosis not present

## 2019-01-16 DIAGNOSIS — F4310 Post-traumatic stress disorder, unspecified: Secondary | ICD-10-CM | POA: Diagnosis not present

## 2019-01-30 DIAGNOSIS — F4310 Post-traumatic stress disorder, unspecified: Secondary | ICD-10-CM | POA: Diagnosis not present

## 2019-01-30 DIAGNOSIS — F411 Generalized anxiety disorder: Secondary | ICD-10-CM | POA: Diagnosis not present

## 2019-02-17 DIAGNOSIS — F411 Generalized anxiety disorder: Secondary | ICD-10-CM | POA: Diagnosis not present

## 2019-02-17 DIAGNOSIS — F4310 Post-traumatic stress disorder, unspecified: Secondary | ICD-10-CM | POA: Diagnosis not present

## 2019-02-26 ENCOUNTER — Other Ambulatory Visit: Payer: Self-pay | Admitting: Psychiatry

## 2019-02-27 ENCOUNTER — Ambulatory Visit (INDEPENDENT_AMBULATORY_CARE_PROVIDER_SITE_OTHER): Payer: BC Managed Care – PPO | Admitting: Psychiatry

## 2019-02-27 ENCOUNTER — Encounter: Payer: Self-pay | Admitting: Psychiatry

## 2019-02-27 ENCOUNTER — Other Ambulatory Visit: Payer: Self-pay

## 2019-02-27 DIAGNOSIS — F331 Major depressive disorder, recurrent, moderate: Secondary | ICD-10-CM

## 2019-02-27 DIAGNOSIS — F411 Generalized anxiety disorder: Secondary | ICD-10-CM | POA: Diagnosis not present

## 2019-02-27 DIAGNOSIS — F4310 Post-traumatic stress disorder, unspecified: Secondary | ICD-10-CM | POA: Diagnosis not present

## 2019-02-27 DIAGNOSIS — F5105 Insomnia due to other mental disorder: Secondary | ICD-10-CM

## 2019-02-27 DIAGNOSIS — G3184 Mild cognitive impairment, so stated: Secondary | ICD-10-CM

## 2019-02-27 MED ORDER — BUSPIRONE HCL 30 MG PO TABS: 30.0000 mg | ORAL_TABLET | Freq: Two times a day (BID) | ORAL | 1 refills | Status: DC

## 2019-02-27 MED ORDER — LAMOTRIGINE 150 MG PO TABS
150.0000 mg | ORAL_TABLET | Freq: Two times a day (BID) | ORAL | 1 refills | Status: DC
Start: 2019-02-27 — End: 2019-08-21

## 2019-02-27 MED ORDER — ZOLPIDEM TARTRATE 5 MG PO TABS: ORAL_TABLET | ORAL | 2 refills | Status: DC

## 2019-02-27 MED ORDER — LITHIUM CARBONATE 150 MG PO CAPS
150.0000 mg | ORAL_CAPSULE | Freq: Every day | ORAL | 3 refills | Status: DC
Start: 2019-02-27 — End: 2019-08-29

## 2019-02-27 MED ORDER — DONEPEZIL HCL 5 MG PO TABS: 5.0000 mg | ORAL_TABLET | Freq: Every day | ORAL | 0 refills | Status: DC

## 2019-02-27 NOTE — Progress Notes (Signed)
[name] [id number] [birth year] 64 y.o.  Subjective:   Patient ID:  Lorraine May is a 64 y.o. (DOB 05-24-1955) female.  Chief Complaint:  Chief Complaint  Patient presents with  . Follow-up    Medication Management and med change  . Anxiety    Medication Management  . Depression    Medication Management  . Post-Traumatic Stress Disorder    Medication Management  . Medication Refill    Buspar     Anxiety    Depression        Associated symptoms include myalgias.  Past medical history includes anxiety.    [name] presents to the office today for follow-up of Hx TRD with benefit from [place] 2016-8 at [facility].   Verty effective.  Last seen June 2020.  The following changes were made: OK trial reduction lamotrigine to 1/2 tablet in the morning and 1 in PM to see if memory is better. Discussed the risk of relapse and if it occurs to increase the medication back to the full dosage. NAC N-acetylcysteine 600 mg daily.  Off label for cognitive complaints including word-finding and STM issues.  No changes with reduction in lamotrigine in cognition.  Added NAC.  Asks about Cerefolin NAC again.   Forgets details in conversation. STM px.  Past history of neuropsych testing which dx MCI.  Decided not to move again and that's a good thing.  Had wondered about reducing the meds but Covid and civil unrest and politics disturbing.  Disturbing over the depth of hatred in the country.  Telehealth is bothersome.  Concerned memory is not as good as in the past and wonders if related to meds.  No sig Xanax.  Sleep ok.  Limiting news.  Stress causes anxiety which causes somatic sx.   Stress caused neuropathy flared.  Feet feels broken and bothers sleep and uncomfortable to walk on it.  Patient reports stable mood and denies depressed or irritable moods.   Patient denies difficulty with sleep initiation or maintenance. Denies appetite disturbance.  Patient reports that energy and motivation  have been good.  Patient denies any difficulty with concentration.  Patient denies any suicidal ideation.  Sleep fairly well with gabapentin and naltrexone.  Past Psychiatric Medication Trials:   Nortriptyline side effects, Trintellix loss of benefit, duloxetine, lamotrigine, mirtazapine, venlafaxine side effects, sertraline, Pristiq, what Viibryd, Wellbutrin, Depakote, carbamazepine, Seroquel, Saphris, Geodon, TMS, trazodone no response, buspirone, gabapentin  History of gastric bypass affects the absorption of some meds. Review of Systems:  Review of Systems  Gastrointestinal: Positive for diarrhea.  Musculoskeletal: Positive for arthralgias, back pain, joint swelling and myalgias.  Neurological: Negative for tremors and weakness.       Bruning neuropathy foot and legs   Psychiatric/Behavioral: Positive for depression.  Sx worse with stress  Medications: I have reviewed the patient's current medications.  Current Outpatient Medications  Medication Sig Dispense Refill  . ALPRAZolam (XANAX) 0.25 MG tablet TAKE 1 TABLET(0.25 MG) BY MOUTH TWICE DAILY AS NEEDED FOR ANXIETY 15 tablet 1  . busPIRone (BUSPAR) 30 MG tablet Take 1 tablet (30 mg total) by mouth 2 (two) times daily. 180 tablet 1  . colestipol (COLESTID) 1 g tablet Take 1 g by mouth daily.   0  . dicyclomine (BENTYL) 20 MG tablet Take 20 mg by mouth as needed for spasms.     Marland Kitchen gabapentin (NEURONTIN) 100 MG capsule TAKE 2 CAPSULES BY MOUTH IN THE MORNING, 2 AT MIDDAY AND 3 IN THE  EVENING 630 capsule 4  . lamoTRIgine (LAMICTAL) 150 MG tablet Take 1 tablet (150 mg total) by mouth 2 (two) times daily. 90 tablet 1  . MYRBETRIQ 50 MG TB24 tablet Take 50 mg by mouth daily.    Marland Kitchen NALTREXONE HCL PO Take 4.5 mg by mouth at bedtime.    . simvastatin (ZOCOR) 40 MG tablet TK 1 T PO IN THE EVENING  4  . valACYclovir (VALTREX) 500 MG tablet Take 4 tablets by mouth. Take 4 tablets and then another 4 tablets 12 hours later as needed for fever  blister  0  . Vitamin D, Ergocalciferol, (DRISDOL) 50000 units CAPS capsule TK 1 C PO ONCE A WK FOR 3 WKS A MONTH  2  . zolpidem (AMBIEN) 5 MG tablet TAKE 1 TABLET BY MOUTH EVERY DAY AT BEDTIME AS NEEDED FOR INSOMNIA 30 tablet 2  . donepezil (ARICEPT) 5 MG tablet Take 1 tablet (5 mg total) by mouth at bedtime. 30 tablet 0  . lithium carbonate 150 MG capsule Take 1 capsule (150 mg total) by mouth daily. 90 capsule 3   No current facility-administered medications for this visit.    Medication Side Effects: None  Allergies:  Allergies  Allergen Reactions  . Demeclocycline Nausea Only  . Dilaudid [Hydromorphone Hcl] Itching  . Fentanyl Itching  . Morphine And Related Itching  . Nsaids     Cannot take due to kidney disease  . Percocet [Oxycodone-Acetaminophen]     itching  . Tetracyclines & Related Nausea Only  . Sulfa Antibiotics Rash    Past Medical History:  Diagnosis Date  . Anxiety   . Anxiety and depression 08/24/2011  . Arthritis   . Cancer ([place])    colon  . Depression   . Hx of colon cancer, stage II 08/24/2011   T3N0  adenoca cecum resected 04/2004  Xeloda adjuvant chemo  . Hyperlipidemia 08/24/2011  . Joint pain   . Nausea & vomiting   . Neuropathy   . PONV (postoperative nausea and vomiting)   . Superficial basal cell carcinoma (BCC) 09/18/2012   Right Temple    Family History  Problem Relation Age of Onset  . Diabetes Mother        type 1  . Stroke Mother   . Dementia Mother   . Thyroid disease Mother   . Alcohol abuse Mother   . Asthma Mother   . Alcohol abuse Father   . Diabetes Father   . Cancer Paternal Aunt        germ cell  . Cancer Maternal Grandmother        colon  . Cancer Maternal Uncle        throat  . Thyroid disease Sister   . Alcohol abuse Brother   . Psychiatric Illness Brother   . Psychiatric Illness Sister   . Asthma Sister   . Neuropathy Neg Hx     Social History   Socioeconomic History  . Marital status: Married    Spouse  name: Herbie Baltimore  . Number of children: 1  . Years of education: 22  . Highest education level: Not on file  Occupational History  . Occupation: Press photographer- Retired  Tobacco Use  . Smoking status: Never Smoker  . Smokeless tobacco: Never Used  Substance and Sexual Activity  . Alcohol use: Yes    Comment: occ  . Drug use: No  . Sexual activity: Not on file  Other Topics Concern  . Not on file  Social  History Narrative   Lives with husband   Caffeine use: 2-3 cups daily   Right handed   Social Determinants of Health   Financial Resource Strain:   . Difficulty of Paying Living Expenses: Not on file  Food Insecurity:   . Worried About Charity fundraiser in the Last Year: Not on file  . Ran Out of Food in the Last Year: Not on file  Transportation Needs:   . Lack of Transportation (Medical): Not on file  . Lack of Transportation (Non-Medical): Not on file  Physical Activity:   . Days of Exercise per Week: Not on file  . Minutes of Exercise per Session: Not on file  Stress:   . Feeling of Stress : Not on file  Social Connections:   . Frequency of Communication with Friends and Family: Not on file  . Frequency of Social Gatherings with Friends and Family: Not on file  . Attends Religious Services: Not on file  . Active Member of Clubs or Organizations: Not on file  . Attends Archivist Meetings: Not on file  . Marital Status: Not on file  Intimate Partner Violence:   . Fear of Current or Ex-Partner: Not on file  . Emotionally Abused: Not on file  . Physically Abused: Not on file  . Sexually Abused: Not on file    Past Medical History, Surgical history, Social history, and Family history were reviewed and updated as appropriate.   Please see review of systems for further details on the patient's review from today.   Objective:   Physical Exam:  There were no vitals taken for this visit.  Physical Exam Constitutional:      General: She is not in acute  distress.    Appearance: She is well-developed.  Musculoskeletal:        General: No deformity.  Neurological:     Mental Status: She is alert and oriented to person, place, and time.     Motor: No tremor.     Coordination: Coordination normal.     Gait: Gait normal.  Psychiatric:        Attention and Perception: She does not perceive auditory hallucinations.        Mood and Affect: Mood is not anxious or depressed. Affect is not labile, blunt, angry or inappropriate.        Speech: Speech normal.        Behavior: Behavior normal.        Thought Content: Thought content normal. Thought content does not include homicidal or suicidal ideation. Thought content does not include homicidal or suicidal plan.        Cognition and Memory: She exhibits impaired recent memory.        Judgment: Judgment normal.     Comments: Insight intact. No auditory or visual hallucinations.  Somatic under stress.  But less so 1 at this time     Lab Review:     Component Value Date/Time   NA 140 02/02/2017 1554   NA 142 08/21/2013 1445   K 5.2 02/02/2017 1554   K 4.1 08/21/2013 1445   CL 102 02/02/2017 1554   CO2 24 02/02/2017 1554   CO2 26 08/21/2013 1445   GLUCOSE 97 02/02/2017 1554   GLUCOSE 120 (H) 02/07/2014 1107   GLUCOSE 91 08/21/2013 1445   BUN 19 02/02/2017 1554   BUN 18.7 08/21/2013 1445   CREATININE 1.17 (H) 02/02/2017 1554   CREATININE 1.1 08/21/2013 1445   CALCIUM  9.3 02/02/2017 1554   CALCIUM 9.1 08/21/2013 1445   PROT 6.6 03/03/2016 1124   PROT 6.5 08/21/2013 1445   ALBUMIN 4.7 02/07/2014 1107   ALBUMIN 3.7 08/21/2013 1445   AST 21 02/07/2014 1107   AST 12 08/21/2013 1445   ALT 14 02/07/2014 1107   ALT 11 08/21/2013 1445   ALKPHOS 64 02/07/2014 1107   ALKPHOS 56 08/21/2013 1445   BILITOT 0.6 02/07/2014 1107   BILITOT 0.27 08/21/2013 1445   GFRNONAA 50 (L) 02/02/2017 1554   GFRAA 57 (L) 02/02/2017 1554       Component Value Date/Time   WBC 6.3 02/07/2014 1107   RBC  4.28 02/07/2014 1107   HGB 12.9 02/07/2014 1107   HGB 12.3 08/21/2013 1445   HCT 38.7 02/07/2014 1107   HCT 37.2 08/21/2013 1445   PLT 253 02/07/2014 1107   PLT 226 08/21/2013 1445   MCV 90.4 02/07/2014 1107   MCV 93.2 08/21/2013 1445   MCH 30.1 02/07/2014 1107   MCHC 33.3 02/07/2014 1107   RDW 13.2 02/07/2014 1107   RDW 13.8 08/21/2013 1445   LYMPHSABS 1.5 08/21/2013 1445   MONOABS 0.3 08/21/2013 1445   EOSABS 0.1 08/21/2013 1445   BASOSABS 0.0 08/21/2013 1445    No results found for: POCLITH, LITHIUM   No results found for: PHENYTOIN, PHENOBARB, VALPROATE, CBMZ   .res Assessment: Plan:    Zakyia was seen today for follow-up, anxiety, depression, post-traumatic stress disorder and medication refill.  Diagnoses and all orders for this visit:  Generalized anxiety disorder -     busPIRone (BUSPAR) 30 MG tablet; Take 1 tablet (30 mg total) by mouth 2 (two) times daily.  Mild cognitive impairment -     donepezil (ARICEPT) 5 MG tablet; Take 1 tablet (5 mg total) by mouth at bedtime. -     lithium carbonate 150 MG capsule; Take 1 capsule (150 mg total) by mouth daily.  Major depressive disorder, recurrent episode, moderate (HCC) -     lamoTRIgine (LAMICTAL) 150 MG tablet; Take 1 tablet (150 mg total) by mouth 2 (two) times daily.  PTSD (post-traumatic stress disorder) -     busPIRone (BUSPAR) 30 MG tablet; Take 1 tablet (30 mg total) by mouth 2 (two) times daily.  Insomnia due to mental condition -     zolpidem (AMBIEN) 5 MG tablet; TAKE 1 TABLET BY MOUTH EVERY DAY AT BEDTIME AS NEEDED FOR INSOMNIA    As can be seen the patient is failed a long list of psychiatric medications.  Fortunately she did respond to [place] for depression.  Cont meds that are working and tolerating well.  Maintained the TMS benefit for depression.  Marland Kitchen    Anxiety symptoms are manageable.  She does have significant residual symptoms which contribute to somatic symptoms and this is chronic.  She is not  sure if the naltrexone has helped neuropathy.  The gabapentin just clearly does.  We discussed the potential cognitive side effects of gabapentin but she feels she needs this medicine.  She wants to continue the naltrexone off label for this purpose which was also recommended by her alternative medicine doctor.  Switch supplements to [facility] which she took in the past for MCI.  Adding the methyl folate may be additionally helpful over regular folic acid.  It also reduces the number of tablets she has to take.  Her vitamin B12 level was not low when she took this form previously.  Has continued telehealth with therapist Alvester Chou  PHD.  Disc potential value of of lithium as a neuro protective agent with some studies showing the benefit of reducing the development of Alzheimer's disease in patients with mild cognitive impairment.  We will use ultra low-dose 150 mg daily.  She is agreeable with this plan.  We discussed side effects of lithium in detail.  Because of continued neurocognitive complaints with a history of diagnosis of mild cognitive impairment by neuropsych testing we will initiate a trial of donepezil 5 mg daily for 1 month and if tolerated increase to 10 mg daily.  We discussed the side effects in detail.  She is interested in pursuing every potential aid for her cognitive complaints.  For ease switch lamotrigine to 150 mg HS.  Dose reduction did not help cognition nor worsen depression.  I would not advise further dose reduction.  She had a question about that but it is not likely to help cognitive function.  Lamotrigine is being used to reduce risk of recurrent depression given multiple med failures as noted above.  Her sleep is managed but we also discussed the potential that zolpidem is interfering with her memory.  Consider alternatives.  This appt was 30 mins.  FU 6 mos.  Lynder Parents, MD, DFAPA   Please see After Visit Summary for patient specific  instructions.  Future Appointments  Date Time Provider [place]  08/29/2019  1:00 PM Cottle, Billey Co., MD CP-CP None    No orders of the defined types were placed in this encounter.     -------------------------------

## 2019-03-02 ENCOUNTER — Ambulatory Visit: Payer: BLUE CROSS/BLUE SHIELD

## 2019-03-08 ENCOUNTER — Ambulatory Visit: Payer: BC Managed Care – PPO | Attending: Internal Medicine

## 2019-03-08 DIAGNOSIS — Z23 Encounter for immunization: Secondary | ICD-10-CM | POA: Insufficient documentation

## 2019-03-08 NOTE — Progress Notes (Signed)
   Covid-19 Vaccination Clinic  Name:  Lorraine May    MRN: [id number] DOB: 11-28-1955  03/08/2019  Ms. Lorraine May was observed post Covid-19 immunization for 15 minutes without incidence. She was provided with Vaccine Information Sheet and instruction to access the V-Safe system.   Ms. Lorraine May was instructed to call 911 with any severe reactions post vaccine: Marland Kitchen Difficulty breathing  . Swelling of your face and throat  . A fast heartbeat  . A bad rash all over your body  . Dizziness and weakness    Immunizations Administered    Name Date Dose VIS Date Route   Pfizer COVID-19 Vaccine 03/08/2019  2:57 PM 0.3 mL 01/12/2019 Intramuscular   Manufacturer: [place]   Lot: [id number]   [place]: [id number]

## 2019-03-13 DIAGNOSIS — F4310 Post-traumatic stress disorder, unspecified: Secondary | ICD-10-CM | POA: Diagnosis not present

## 2019-03-13 DIAGNOSIS — F411 Generalized anxiety disorder: Secondary | ICD-10-CM | POA: Diagnosis not present

## 2019-03-23 ENCOUNTER — Ambulatory Visit: Payer: BLUE CROSS/BLUE SHIELD

## 2019-03-26 ENCOUNTER — Other Ambulatory Visit: Payer: Self-pay | Admitting: Psychiatry

## 2019-03-26 DIAGNOSIS — G3184 Mild cognitive impairment, so stated: Secondary | ICD-10-CM

## 2019-03-27 DIAGNOSIS — F4310 Post-traumatic stress disorder, unspecified: Secondary | ICD-10-CM | POA: Diagnosis not present

## 2019-03-27 DIAGNOSIS — F411 Generalized anxiety disorder: Secondary | ICD-10-CM | POA: Diagnosis not present

## 2019-04-02 ENCOUNTER — Ambulatory Visit: Payer: BC Managed Care – PPO | Attending: Internal Medicine

## 2019-04-02 DIAGNOSIS — Z23 Encounter for immunization: Secondary | ICD-10-CM | POA: Insufficient documentation

## 2019-04-02 NOTE — Progress Notes (Signed)
   Covid-19 Vaccination Clinic  Name:  Lorraine May    MRN: [id number] DOB: September 26, 1955  04/02/2019  [name] was observed post Covid-19 immunization for 15 minutes without incidence. She was provided with Vaccine Information Sheet and instruction to access the V-Safe system.   [name] was instructed to call 911 with any severe reactions post vaccine: Marland Kitchen Difficulty breathing  . Swelling of your face and throat  . A fast heartbeat  . A bad rash all over your body  . Dizziness and weakness    Immunizations Administered    Name Date Dose VIS Date Route   Pfizer COVID-19 Vaccine 04/02/2019  5:02 PM 0.3 mL 01/12/2019 Intramuscular   Manufacturer: [place]   Lot: [id number]   [place]: [id number]

## 2019-04-06 ENCOUNTER — Telehealth: Payer: Self-pay | Admitting: Psychiatry

## 2019-04-06 NOTE — Telephone Encounter (Signed)
Updated RX called into pharmacy for DYE FREE Naltrexone 4.5 mg spoke directly with pharmacist.

## 2019-04-06 NOTE — Telephone Encounter (Signed)
OK.  Please call and ask pharmacy if they will do this.  [facility] compounding [facility]

## 2019-04-06 NOTE — Telephone Encounter (Signed)
Pt called to ask  provider to call pharmacy to modify Naltrexone HCL 4.5 mg to DIE FREE. Pt is itching and pharmacy thinks this will help. Pharmacy is [facility].

## 2019-04-10 DIAGNOSIS — F4310 Post-traumatic stress disorder, unspecified: Secondary | ICD-10-CM | POA: Diagnosis not present

## 2019-04-10 DIAGNOSIS — F411 Generalized anxiety disorder: Secondary | ICD-10-CM | POA: Diagnosis not present

## 2019-04-24 DIAGNOSIS — F411 Generalized anxiety disorder: Secondary | ICD-10-CM | POA: Diagnosis not present

## 2019-04-24 DIAGNOSIS — F4310 Post-traumatic stress disorder, unspecified: Secondary | ICD-10-CM | POA: Diagnosis not present

## 2019-05-08 DIAGNOSIS — F4310 Post-traumatic stress disorder, unspecified: Secondary | ICD-10-CM | POA: Diagnosis not present

## 2019-05-08 DIAGNOSIS — F411 Generalized anxiety disorder: Secondary | ICD-10-CM | POA: Diagnosis not present

## 2019-05-22 DIAGNOSIS — F411 Generalized anxiety disorder: Secondary | ICD-10-CM | POA: Diagnosis not present

## 2019-05-22 DIAGNOSIS — F4310 Post-traumatic stress disorder, unspecified: Secondary | ICD-10-CM | POA: Diagnosis not present

## 2019-07-03 DIAGNOSIS — F4310 Post-traumatic stress disorder, unspecified: Secondary | ICD-10-CM | POA: Diagnosis not present

## 2019-07-03 DIAGNOSIS — F411 Generalized anxiety disorder: Secondary | ICD-10-CM | POA: Diagnosis not present

## 2019-07-17 DIAGNOSIS — F4310 Post-traumatic stress disorder, unspecified: Secondary | ICD-10-CM | POA: Diagnosis not present

## 2019-07-17 DIAGNOSIS — F411 Generalized anxiety disorder: Secondary | ICD-10-CM | POA: Diagnosis not present

## 2019-07-31 ENCOUNTER — Ambulatory Visit (INDEPENDENT_AMBULATORY_CARE_PROVIDER_SITE_OTHER): Payer: BC Managed Care – PPO

## 2019-07-31 ENCOUNTER — Other Ambulatory Visit: Payer: Self-pay

## 2019-07-31 ENCOUNTER — Ambulatory Visit (INDEPENDENT_AMBULATORY_CARE_PROVIDER_SITE_OTHER): Payer: BC Managed Care – PPO | Admitting: Podiatry

## 2019-07-31 DIAGNOSIS — M79671 Pain in right foot: Secondary | ICD-10-CM | POA: Diagnosis not present

## 2019-07-31 DIAGNOSIS — F4310 Post-traumatic stress disorder, unspecified: Secondary | ICD-10-CM | POA: Diagnosis not present

## 2019-07-31 DIAGNOSIS — M84374A Stress fracture, right foot, initial encounter for fracture: Secondary | ICD-10-CM | POA: Diagnosis not present

## 2019-07-31 DIAGNOSIS — S99921A Unspecified injury of right foot, initial encounter: Secondary | ICD-10-CM

## 2019-07-31 DIAGNOSIS — G8929 Other chronic pain: Secondary | ICD-10-CM

## 2019-07-31 DIAGNOSIS — M722 Plantar fascial fibromatosis: Secondary | ICD-10-CM

## 2019-07-31 DIAGNOSIS — F411 Generalized anxiety disorder: Secondary | ICD-10-CM | POA: Diagnosis not present

## 2019-07-31 NOTE — Patient Instructions (Signed)
Stress Fracture  A stress fracture is a small break or crack in a bone. A stress fracture can be fully broken (complete) or partially broken (incomplete). The most common sites for stress fractures are the bones in the front of your feet (metatarsals), your heel (calcaneus), and the long bone of your lower leg (tibia). What are the causes? This condition is caused by overuse or repetitive exercise, such as running. It happens when a bone cannot absorb any more shock because the muscles around it are weak. Stress fractures happen most commonly when:  You rapidly increase or start a new physical activity.  You use shoes that are worn out or do not fit properly.  You exercise on a new surface. What increases the risk? You are more likely to develop this condition if:  You have a condition that causes weak bones (osteoporosis).  You are female. Stress fractures are more likely to occur in women. What are the signs or symptoms? The most common symptom of a stress fracture is feeling pain when you are using or putting weight on the affected part of your body. The pain usually improves when you are resting. Other symptoms may include:  Swelling of the affected area.  Pain in the area when it is touched. Stress fracture pain usually develops over time. How is this diagnosed? This condition may be diagnosed by:  Your symptoms.  Your medical history.  A physical exam.  Imaging tests, such as: ? X-rays. ? MRI. ? Bone scan. How is this treated? Treatment depends on the severity of your stress fracture. It is commonly treated with resting, icing, compression, and elevation (RICE therapy). Treatment may also include:  Medicines to reduce inflammation.  A cast or a walking shoe.  Crutches.  Surgery. This is usually only in severe cases. Follow these instructions at home: If you have a cast:  Do not put pressure on any part of the cast until it is fully hardened. This may take  several hours.  Do not stick anything inside the cast to scratch your skin. Doing that increases your risk of infection.  Check the skin around the cast every day. Tell your health care provider about any concerns.  You may put lotion on dry skin around the edges of the cast. Do not put lotion on the skin underneath the cast.  Keep the cast clean.  If the cast is not waterproof: ? Do not let it get wet. ? Cover it with a watertight covering when you take a bath or a shower. If you have a walking shoe:  Wear the shoe as told by your health care provider. Remove it only as told by your health care provider.  Loosen the shoe if your toes tingle, become numb, or turn cold and blue.  Keep the shoe clean.  If the shoe is not waterproof: ? Do not let it get wet. Managing pain, stiffness, and swelling   If directed, apply ice to the injured area: ? If you have a walking shoe, remove the shoe as told by your health care provider. ? Put ice in a plastic bag. ? Place a towel between your skin and the bag or between your cast and the bag. ? Leave the ice on for 20 minutes, 2-3 times per day.  Move your toes often to avoid stiffness and to lessen swelling.  Raise (elevate) the injured area above the level of your heart while you are sitting or lying down. Activity  Rest  as directed by your health care provider. Ask your health care provider if you may do alternative exercises, such as swimming or biking, while you are healing.  Return to your normal activities as directed by your health care provider. Ask your health care provider what activities are safe for you.  Perform range-of-motion exercises only as directed by your health care provider. General instructions  Do not use the injured limb to support yourbody weight until your health care provider says that you can. Use crutches if your health care provider tells you to do so.  Do not use any products that contain nicotine or  tobacco, such as cigarettes and e-cigarettes. These can delay bone healing. If you need help quitting, ask your health care provider.  Take over-the-counter and prescription medicines only as told by your health care provider.  Keep all follow-up visits as told by your health care provider. This is important. How is this prevented?  Only wear shoes that: ? Fit well. ? Are not worn out.  Eat a healthy diet that contains vitamin D and calcium. This helps keep your bones strong. Good sources of calcium and vitamin D include: ? Low-fat dairy products such as milk, yogurt, and cheese. ? Certain fish, such as fresh or canned salmon, tuna, and sardines. ? Products that have calcium and vitamin D added to them (fortified products), such as fortified cereals or juice.  Be careful when you start a new physical activity. Give your body time to adjust.  Avoid doing only one kind of activity. Do different exercises, such as swimming and running, so that no single part of your body gets overused.  Do strength-training exercises. Contact a health care provider if:  Your pain gets worse.  You have new symptoms.  You have increased swelling. Get help right away if:  You lose feeling in the injured area. Summary  A stress fracture is a small break or crack in a bone. A stress fracture can be fully broken (complete) or partially broken (incomplete).  This condition is caused by overuse or repetitive exercise, such as running.  The most common symptom of a stress fracture is feeling pain when you are using or putting weight on the affected part of your body.  Treatment depends on the severity of your stress fracture. This information is not intended to replace advice given to you by your health care provider. Make sure you discuss any questions you have with your health care provider. Document Revised: 03/01/2017 Document Reviewed: 03/01/2017 Elsevier Patient Education  2020 [employer].

## 2019-08-01 NOTE — Progress Notes (Signed)
Subjective:   Patient ID: Lorraine May, female   DOB: 63 y.o.   MRN: [id number]   HPI 64 year old female presents the office today for concerns of right heel pain which is been ongoing for the last 4 weeks.  She is not sure if she broke something even the pain.  She states that she was at the beach and she was tomorrow walking and going up and down steps more.  She had to call her she had immediate pain to bilateral heels and since then she is having consistent pain with walking.  She feels that there is a pulling, tightness sensation to the bottom of the heel as well but in the arch.  There is any swelling.  No significant injury that she can recall.  No recent treatment.  She has no other concerns today.   Review of Systems  All other systems reviewed and are negative.  Past Medical History:  Diagnosis Date  . Anxiety   . Anxiety and depression 08/24/2011  . Arthritis   . Cancer ([place])    colon  . Depression   . Hx of colon cancer, stage II 08/24/2011   T3N0  adenoca cecum resected 04/2004  Xeloda adjuvant chemo  . Hyperlipidemia 08/24/2011  . Joint pain   . Nausea & vomiting   . Neuropathy   . PONV (postoperative nausea and vomiting)   . Superficial basal cell carcinoma (BCC) 09/18/2012   Right Temple    Past Surgical History:  Procedure Laterality Date  . ABDOMINAL HYSTERECTOMY  2004  . BREAST SURGERY  2011   reduction  . CHOLECYSTECTOMY  10/05/2011   Procedure: LAPAROSCOPIC CHOLECYSTECTOMY WITH INTRAOPERATIVE CHOLANGIOGRAM;  Surgeon: Stark Klein, MD;  Location: [facility];  Service: General;  Laterality: N/A;  . [place]  . HEMICOLECTOMY Right 2006  . HERNIA REPAIR  2007  . KNEE ARTHROSCOPY  2010  . NASAL SEPTUM SURGERY  1983  . OTHER SURGICAL HISTORY  2016   Transcranial magnetic stimulation  . PORT-A-CATH REMOVAL  2006   insertion and removal  . REDUCTION MAMMAPLASTY Bilateral 2010  . STRABISMUS SURGERY  1962     Current Outpatient Medications:   .  ALPRAZolam (XANAX) 0.25 MG tablet, TAKE 1 TABLET(0.25 MG) BY MOUTH TWICE DAILY AS NEEDED FOR ANXIETY, Disp: 15 tablet, Rfl: 1 .  busPIRone (BUSPAR) 30 MG tablet, Take 1 tablet (30 mg total) by mouth 2 (two) times daily., Disp: 180 tablet, Rfl: 1 .  colestipol (COLESTID) 1 g tablet, Take 1 g by mouth daily. , Disp: , Rfl: 0 .  dicyclomine (BENTYL) 20 MG tablet, Take 20 mg by mouth as needed for spasms. , Disp: , Rfl:  .  donepezil (ARICEPT) 5 MG tablet, TAKE 1 TABLET(5 MG) BY MOUTH AT BEDTIME, Disp: 30 tablet, Rfl: 5 .  gabapentin (NEURONTIN) 100 MG capsule, TAKE 2 CAPSULES BY MOUTH IN THE MORNING, 2 AT MIDDAY AND 3 IN THE EVENING, Disp: 630 capsule, Rfl: 4 .  lamoTRIgine (LAMICTAL) 150 MG tablet, Take 1 tablet (150 mg total) by mouth 2 (two) times daily., Disp: 90 tablet, Rfl: 1 .  lithium carbonate 150 MG capsule, Take 1 capsule (150 mg total) by mouth daily., Disp: 90 capsule, Rfl: 3 .  MYRBETRIQ 50 MG TB24 tablet, Take 50 mg by mouth daily., Disp: , Rfl:  .  NALTREXONE HCL PO, Take 4.5 mg by mouth at bedtime., Disp: , Rfl:  .  simvastatin (ZOCOR) 40 MG tablet, TK 1  T PO IN THE EVENING, Disp: , Rfl: 4 .  valACYclovir (VALTREX) 500 MG tablet, Take 4 tablets by mouth. Take 4 tablets and then another 4 tablets 12 hours later as needed for fever blister, Disp: , Rfl: 0 .  Vitamin D, Ergocalciferol, (DRISDOL) 50000 units CAPS capsule, TK 1 C PO ONCE A WK FOR 3 WKS A MONTH, Disp: , Rfl: 2 .  zolpidem (AMBIEN) 5 MG tablet, TAKE 1 TABLET BY MOUTH EVERY DAY AT BEDTIME AS NEEDED FOR INSOMNIA, Disp: 30 tablet, Rfl: 2  Allergies  Allergen Reactions  . Demeclocycline Nausea Only  . Dilaudid [Hydromorphone Hcl] Itching  . Fentanyl Itching  . Morphine And Related Itching  . Nsaids     Cannot take due to kidney disease  . Percocet [Oxycodone-Acetaminophen]     itching  . Tetracyclines & Related Nausea Only  . Sulfa Antibiotics Rash          Objective:  Physical Exam  General: AAO x3,  NAD  Dermatological: Skin is warm, dry and supple bilateral. Nails x 10 are well manicured; remaining integument appears unremarkable at this time. There are no open sores, no preulcerative lesions, no rash or signs of infection present.  Vascular: Dorsalis Pedis artery and Posterior Tibial artery pedal pulses are 2/4 bilateral with immedate capillary fill time.  There is no pain with calf compression, swelling, warmth, erythema.   Neruologic: Previously diagnosed with neuropathy due to chemotherapy.  Musculoskeletal: Mild tenderness with lateral compression of calcaneus but the majority tenderness is the plantar aspect calcaneus at the insertion of plantar fascia as well as the arch of the foot.  There is trace edema but there is no significant erythema or warmth.  Flexor, extensor tendons appear to be intact.  Achilles tendon intact.  Plantar fascial appears to be intact.  Muscular strength 5/5 in all groups tested bilateral.  Gait: Unassisted, Nonantalgic.       Assessment:   64 year old female plantar fasciitis however concern for calcaneal stress fracture    Plan:  -Treatment options discussed including all alternatives, risks, and complications -Etiology of symptoms were discussed -X-rays were obtained and reviewed with the patient.  On the lateral view there is a sclerotic line present within the body of the calcaneus from concern of stress fracture also given her symptoms.  Given the swelling in the legs in the cam boot.  She can use Tylenol, ice and elevate.  I will see her back in 2 to 3 weeks with repeat x-rays or sooner if needed.  Trula Slade DPM

## 2019-08-07 ENCOUNTER — Ambulatory Visit: Payer: BC Managed Care – PPO | Admitting: Psychiatry

## 2019-08-13 ENCOUNTER — Other Ambulatory Visit: Payer: Self-pay | Admitting: Psychiatry

## 2019-08-14 DIAGNOSIS — F411 Generalized anxiety disorder: Secondary | ICD-10-CM | POA: Diagnosis not present

## 2019-08-14 DIAGNOSIS — F4310 Post-traumatic stress disorder, unspecified: Secondary | ICD-10-CM | POA: Diagnosis not present

## 2019-08-20 ENCOUNTER — Other Ambulatory Visit: Payer: Self-pay | Admitting: Psychiatry

## 2019-08-20 DIAGNOSIS — F331 Major depressive disorder, recurrent, moderate: Secondary | ICD-10-CM

## 2019-08-21 ENCOUNTER — Encounter: Payer: Self-pay | Admitting: Podiatry

## 2019-08-21 ENCOUNTER — Ambulatory Visit (INDEPENDENT_AMBULATORY_CARE_PROVIDER_SITE_OTHER): Payer: BC Managed Care – PPO | Admitting: Podiatry

## 2019-08-21 ENCOUNTER — Ambulatory Visit (INDEPENDENT_AMBULATORY_CARE_PROVIDER_SITE_OTHER): Payer: BC Managed Care – PPO

## 2019-08-21 ENCOUNTER — Other Ambulatory Visit: Payer: Self-pay

## 2019-08-21 DIAGNOSIS — M84374A Stress fracture, right foot, initial encounter for fracture: Secondary | ICD-10-CM

## 2019-08-21 DIAGNOSIS — M722 Plantar fascial fibromatosis: Secondary | ICD-10-CM

## 2019-08-21 DIAGNOSIS — M84374D Stress fracture, right foot, subsequent encounter for fracture with routine healing: Secondary | ICD-10-CM | POA: Diagnosis not present

## 2019-08-21 NOTE — Patient Instructions (Signed)

## 2019-08-22 DIAGNOSIS — M722 Plantar fascial fibromatosis: Secondary | ICD-10-CM | POA: Insufficient documentation

## 2019-08-22 NOTE — Progress Notes (Signed)
Subjective: 64 year old female presents the office today for follow evaluation of right heel pain, stress fracture, plantar fasciitis.  She states that she has been doing better but she still having discomfort in the bottom of her heel.  She is been in the cam boot still.  She still icing elevating she has no new concerns no recent injury or changes since I last saw her. Denies any systemic complaints such as fevers, chills, nausea, vomiting. No acute changes since last appointment, and no other complaints at this time.   Objective: AAO x3, NAD DP/PT pulses palpable bilaterally, CRT less than 3 seconds Majority discomfort is to the plantar medial tubercle of the calcaneus and insertion of plantar fascial.  Today there is no pain with lateral compression of calcaneus there is no pain to the Achilles tendon.  Plantar fascia, Achilles tendon appears to be intact. No open lesions or pre-ulcerative lesions.  No pain with calf compression, swelling, warmth, erythema  Assessment: Resolving stress fracture right calcaneus; plantar fasciitis  Plan: -All treatment options discussed with the patient including all alternatives, risks, complications.  -Repeat x-rays obtained reviewed.  The sclerotic line present in the body of the calcaneus appears to be resolved.  There is no evidence of acute fracture identified today. -Overall she is doing better with some discomfort I think more of her discomfort in the plantar fascia.  I want her to remain in the cam boot for the next week as she starts to feel better start stretching, rehab exercises.  She can ice the area daily.  As she starts to feel better she can transition back into regular shoe as tolerated. -Patient encouraged to call the office with any questions, concerns, change in symptoms.   Trula Slade DPM

## 2019-08-24 ENCOUNTER — Other Ambulatory Visit: Payer: Self-pay | Admitting: Psychiatry

## 2019-08-24 DIAGNOSIS — F4310 Post-traumatic stress disorder, unspecified: Secondary | ICD-10-CM

## 2019-08-24 DIAGNOSIS — F411 Generalized anxiety disorder: Secondary | ICD-10-CM

## 2019-08-28 ENCOUNTER — Other Ambulatory Visit: Payer: Self-pay | Admitting: Obstetrics and Gynecology

## 2019-08-28 DIAGNOSIS — Z1231 Encounter for screening mammogram for malignant neoplasm of breast: Secondary | ICD-10-CM

## 2019-08-28 DIAGNOSIS — F4310 Post-traumatic stress disorder, unspecified: Secondary | ICD-10-CM | POA: Diagnosis not present

## 2019-08-28 DIAGNOSIS — F411 Generalized anxiety disorder: Secondary | ICD-10-CM | POA: Diagnosis not present

## 2019-08-29 ENCOUNTER — Encounter: Payer: Self-pay | Admitting: Psychiatry

## 2019-08-29 ENCOUNTER — Other Ambulatory Visit: Payer: Self-pay

## 2019-08-29 ENCOUNTER — Ambulatory Visit (INDEPENDENT_AMBULATORY_CARE_PROVIDER_SITE_OTHER): Payer: BC Managed Care – PPO | Admitting: Psychiatry

## 2019-08-29 DIAGNOSIS — F4310 Post-traumatic stress disorder, unspecified: Secondary | ICD-10-CM

## 2019-08-29 DIAGNOSIS — F411 Generalized anxiety disorder: Secondary | ICD-10-CM | POA: Diagnosis not present

## 2019-08-29 DIAGNOSIS — F331 Major depressive disorder, recurrent, moderate: Secondary | ICD-10-CM

## 2019-08-29 DIAGNOSIS — G3184 Mild cognitive impairment, so stated: Secondary | ICD-10-CM | POA: Diagnosis not present

## 2019-08-29 DIAGNOSIS — F5105 Insomnia due to other mental disorder: Secondary | ICD-10-CM

## 2019-08-29 NOTE — Progress Notes (Signed)
Lorraine May [id number] 26-Jul-1955 64 y.o.  Subjective:   Patient ID:  Lorraine May is a 64 y.o. (DOB 1955/08/04) female.  Chief Complaint:  Chief Complaint  Patient presents with  . Follow-up    mood and sleep    Anxiety    Depression        Associated symptoms include myalgias.  Past medical history includes anxiety.    [name] presents to the office today for follow-up of Hx TRD with benefit from [place] 2016-8 at [facility].   Verty effective.  seen June 2020.  The following changes were made: OK trial reduction lamotrigine to 1/2 tablet in the morning and 1 in PM to see if memory is better. Discussed the risk of relapse and if it occurs to increase the medication back to the full dosage. NAC N-acetylcysteine 600 mg daily.  Off label for cognitive complaints including word-finding and STM issues.  Jan 2021 appt with the following noted: No changes with reduction in lamotrigine in cognition.  Added NAC.  Asks about Cerefolin NAC again.   Forgets details in conversation. STM px.  Past history of neuropsych testing which dx MCI.  Decided not to move again and that's a good thing.  Had wondered about reducing the meds but Covid and civil unrest and politics disturbing.  Disturbing over the depth of hatred in the country.  Telehealth is bothersome.  Concerned memory is not as good as in the past and wonders if related to meds. No sig Xanax. Patient reports stable mood and denies depressed or irritable moods.   Patient denies difficulty with sleep initiation or maintenance.  Sleep fairly well with gabapentin and naltrexone. No med changes.  08/29/19 appt with the following noted: Vaccinated. Donepezil 5 without benefit and caused diarrhea so stopped. Stopped naltrexone bc decided it didn't help neuropathy. Patient reports mild depressed and worry over Covid.  Patient denies difficulty with sleep initiation or maintenance. Denies appetite disturbance.  Patient reports that energy and  motivation have been good.  Patient denies any difficulty with concentration.  Patient denies any suicidal ideation. Wonders about restarting naltrexone at low dose by [employer].bc it did help sleep some.  Asks I RX it. Having to use Ambien now bc foot problems. Has continued [name]. Recognizes some avoidance.  Past Psychiatric Medication Trials:   Nortriptyline side effects, Trintellix loss of benefit, duloxetine, lamotrigine, mirtazapine, venlafaxine side effects, sertraline, Pristiq, what Viibryd, Wellbutrin, Depakote, carbamazepine, Seroquel, Saphris, Geodon, TMS, trazodone no response, buspirone, gabapentin  History of gastric bypass affects the absorption of some meds. Review of Systems:  Review of Systems  Gastrointestinal: Positive for diarrhea.  Musculoskeletal: Positive for arthralgias, back pain, joint swelling and myalgias.  Neurological: Negative for tremors and weakness.       Bruning neuropathy foot and legs   Psychiatric/Behavioral: Positive for depression.  Sx worse with stress  Medications: I have reviewed the patient's current medications.  Current Outpatient Medications  Medication Sig Dispense Refill  . ALPRAZolam (XANAX) 0.25 MG tablet TAKE 1 TABLET(0.25 MG) BY MOUTH TWICE DAILY AS NEEDED FOR ANXIETY 15 tablet 0  . busPIRone (BUSPAR) 30 MG tablet TAKE 1 TABLET(30 MG) BY MOUTH TWICE DAILY 180 tablet 0  . colestipol (COLESTID) 1 g tablet Take 1 g by mouth daily.   0  . dicyclomine (BENTYL) 20 MG tablet Take 20 mg by mouth as needed for spasms.     Marland Kitchen gabapentin (NEURONTIN) 100 MG capsule TAKE 2 CAPSULES BY  MOUTH IN THE MORNING, 2 AT MIDDAY AND 3 IN THE EVENING 630 capsule 4  . lamoTRIgine (LAMICTAL) 150 MG tablet TAKE 1 TABLET(150 MG) BY MOUTH TWICE DAILY (Patient taking differently: Take 150 mg by mouth daily. ) 180 tablet 0  . Methylfol-Methylcob-Acetylcyst (CEREFOLIN NAC) 6-2-600 MG TABS Take 1 tablet by mouth daily.    . simvastatin (ZOCOR) 40  MG tablet TK 1 T PO IN THE EVENING  4  . valACYclovir (VALTREX) 500 MG tablet Take 4 tablets by mouth. Take 4 tablets and then another 4 tablets 12 hours later as needed for fever blister  0  . Vitamin D, Ergocalciferol, (DRISDOL) 50000 units CAPS capsule TK 1 C PO ONCE A WK FOR 3 WKS A MONTH  2  . zolpidem (AMBIEN) 5 MG tablet TAKE 1 TABLET BY MOUTH EVERY DAY AT BEDTIME AS NEEDED FOR INSOMNIA 30 tablet 2  . MYRBETRIQ 50 MG TB24 tablet Take 50 mg by mouth daily.     No current facility-administered medications for this visit.    Medication Side Effects: None  Allergies:  Allergies  Allergen Reactions  . Demeclocycline Nausea Only  . Dilaudid [Hydromorphone Hcl] Itching  . Fentanyl Itching  . Morphine And Related Itching  . Nsaids     Cannot take due to kidney disease  . Percocet [Oxycodone-Acetaminophen]     itching  . Tetracyclines & Related Nausea Only  . Sulfa Antibiotics Rash    Past Medical History:  Diagnosis Date  . Anxiety   . Anxiety and depression 08/24/2011  . Arthritis   . Cancer ([place])    colon  . Depression   . Hx of colon cancer, stage II 08/24/2011   T3N0  adenoca cecum resected 04/2004  Xeloda adjuvant chemo  . Hyperlipidemia 08/24/2011  . Joint pain   . Nausea & vomiting   . Neuropathy   . PONV (postoperative nausea and vomiting)   . Superficial basal cell carcinoma (BCC) 09/18/2012   Right [place]    Family History  Problem Relation Age of Onset  . Diabetes Mother        type 1  . Stroke Mother   . Dementia Mother   . Thyroid disease Mother   . Alcohol abuse Mother   . Asthma Mother   . Alcohol abuse Father   . Diabetes Father   . Cancer Paternal Aunt        germ cell  . Cancer Maternal Grandmother        colon  . Cancer Maternal Uncle        throat  . Thyroid disease Sister   . Alcohol abuse Brother   . Psychiatric Illness Brother   . Psychiatric Illness Sister   . Asthma Sister   . Neuropathy Neg Hx     Social History    Socioeconomic History  . Marital status: Married    Spouse name: Herbie Baltimore  . Number of children: 1  . Years of education: 44  . Highest education level: Not on file  Occupational History  . Occupation: Press photographer- Retired  Tobacco Use  . Smoking status: Never Smoker  . Smokeless tobacco: Never Used  Vaping Use  . Vaping Use: Never used  Substance and Sexual Activity  . Alcohol use: Yes    Comment: occ  . Drug use: No  . Sexual activity: Not on file  Other Topics Concern  . Not on file  Social History Narrative   Lives with husband   Caffeine use:  2-3 cups daily   Right handed   Social Determinants of Health   Financial Resource Strain:   . Difficulty of Paying Living Expenses:   Food Insecurity:   . Worried About Charity fundraiser in the Last Year:   . Arboriculturist in the Last Year:   Transportation Needs:   . Film/video editor (Medical):   Marland Kitchen Lack of Transportation (Non-Medical):   Physical Activity:   . Days of Exercise per Week:   . Minutes of Exercise per Session:   Stress:   . Feeling of Stress :   Social Connections:   . Frequency of Communication with Friends and Family:   . Frequency of Social Gatherings with Friends and Family:   . Attends Religious Services:   . Active Member of Clubs or Organizations:   . Attends Archivist Meetings:   Marland Kitchen Marital Status:   Intimate Partner Violence:   . Fear of Current or Ex-Partner:   . Emotionally Abused:   Marland Kitchen Physically Abused:   . Sexually Abused:     Past Medical History, Surgical history, Social history, and Family history were reviewed and updated as appropriate.   Please see review of systems for further details on the patient's review from today.   Objective:   Physical Exam:  There were no vitals taken for this visit.  Physical Exam Constitutional:      General: She is not in acute distress.    Appearance: She is well-developed.  Musculoskeletal:        General: No deformity.   Neurological:     Mental Status: She is alert and oriented to person, place, and time.     Motor: No tremor.     Coordination: Coordination normal.     Gait: Gait normal.  Psychiatric:        Attention and Perception: She does not perceive auditory hallucinations.        Mood and Affect: Mood is not anxious or depressed. Affect is not labile, blunt, angry or inappropriate.        Speech: Speech normal.        Behavior: Behavior normal.        Thought Content: Thought content normal. Thought content does not include homicidal or suicidal ideation. Thought content does not include homicidal or suicidal plan.        Cognition and Memory: She exhibits impaired recent memory.        Judgment: Judgment normal.     Comments: Insight intact. No auditory or visual hallucinations.  Somatic under stress.  But less so 1 at this time     Lab Review:     Component Value Date/Time   NA 140 02/02/2017 1554   NA 142 08/21/2013 1445   K 5.2 02/02/2017 1554   K 4.1 08/21/2013 1445   CL 102 02/02/2017 1554   CO2 24 02/02/2017 1554   CO2 26 08/21/2013 1445   GLUCOSE 97 02/02/2017 1554   GLUCOSE 120 (H) 02/07/2014 1107   GLUCOSE 91 08/21/2013 1445   BUN 19 02/02/2017 1554   BUN 18.7 08/21/2013 1445   CREATININE 1.17 (H) 02/02/2017 1554   CREATININE 1.1 08/21/2013 1445   CALCIUM 9.3 02/02/2017 1554   CALCIUM 9.1 08/21/2013 1445   PROT 6.6 03/03/2016 1124   PROT 6.5 08/21/2013 1445   ALBUMIN 4.7 02/07/2014 1107   ALBUMIN 3.7 08/21/2013 1445   AST 21 02/07/2014 1107   AST 12 08/21/2013 1445  ALT 14 02/07/2014 1107   ALT 11 08/21/2013 1445   ALKPHOS 64 02/07/2014 1107   ALKPHOS 56 08/21/2013 1445   BILITOT 0.6 02/07/2014 1107   BILITOT 0.27 08/21/2013 1445   GFRNONAA 50 (L) 02/02/2017 1554   GFRAA 57 (L) 02/02/2017 1554       Component Value Date/Time   WBC 6.3 02/07/2014 1107   RBC 4.28 02/07/2014 1107   HGB 12.9 02/07/2014 1107   HGB 12.3 08/21/2013 1445   HCT 38.7 02/07/2014  1107   HCT 37.2 08/21/2013 1445   PLT 253 02/07/2014 1107   PLT 226 08/21/2013 1445   MCV 90.4 02/07/2014 1107   MCV 93.2 08/21/2013 1445   MCH 30.1 02/07/2014 1107   MCHC 33.3 02/07/2014 1107   RDW 13.2 02/07/2014 1107   RDW 13.8 08/21/2013 1445   LYMPHSABS 1.5 08/21/2013 1445   MONOABS 0.3 08/21/2013 1445   EOSABS 0.1 08/21/2013 1445   BASOSABS 0.0 08/21/2013 1445    No results found for: POCLITH, LITHIUM   No results found for: PHENYTOIN, PHENOBARB, VALPROATE, CBMZ   .res Assessment: Plan:    Jannie was seen today for follow-up.  Diagnoses and all orders for this visit:  Generalized anxiety disorder  PTSD (post-traumatic stress disorder)  Major depressive disorder, recurrent episode, moderate (HCC)  Mild cognitive impairment  Insomnia due to mental condition    As can be seen the patient is failed a long list of psychiatric medications.  Fortunately she did respond to [place] for depression.  Cont meds that are working and tolerating well.  Maintained the TMS benefit for depression.  Marland Kitchen    Anxiety symptoms are worse with Covid.  She does have significant residual symptoms which contribute to somatic symptoms and this is chronic.  She is not sure if the naltrexone has helped neuropathy.  The gabapentin just clearly does.  We discussed the potential cognitive side effects of gabapentin but she feels she needs this medicine.  She wants to continue the naltrexone off label for this purpose which was also recommended by her alternative medicine doctor.  Switch supplements to [facility] which she took in the past for MCI.  Adding the methyl folate may be additionally helpful over regular folic acid.  It also reduces the number of tablets she has to take.  Her vitamin B12 level was not low when she took this form previously.  Has continued telehealth with therapist [name].  Disc potential value of of lithium as a neuro protective agent with some studies showing the  benefit of reducing the development of Alzheimer's disease in patients with mild cognitive impairment.  We will use ultra low-dose 150 mg daily.  She is fearful but wants to consider.  Gave copy of Dr. Narda Rutherford article.  We discussed side effects of lithium in detail.  Because of continued neurocognitive complaints with a history of diagnosis of mild cognitive impairment by neuropsych testing we rec lithium but she has stigma about it.  For ease switch lamotrigine to 150 mg HS.  Dose reduction did not help cognition nor worsen depression.  I would not advise further dose reduction.  She had a question about that but it is not likely to help cognitive function.  Lamotrigine is being used to reduce risk of recurrent depression given multiple med failures as noted above.  Her sleep is managed but we also discussed the potential that zolpidem is interfering with her memory.  Consider alternatives.  This appt was 30 mins.  FU 6 mos.  Lynder Parents, MD, DFAPA   Please see After Visit Summary for patient specific instructions.  Future Appointments  Date Time Provider [place]  09/06/2019 10:15 AM Trula Slade, DPM [facility]  09/12/2019  2:30 PM [facility]    No orders of the defined types were placed in this encounter.     -------------------------------

## 2019-09-04 DIAGNOSIS — F4310 Post-traumatic stress disorder, unspecified: Secondary | ICD-10-CM | POA: Diagnosis not present

## 2019-09-04 DIAGNOSIS — F411 Generalized anxiety disorder: Secondary | ICD-10-CM | POA: Diagnosis not present

## 2019-09-06 ENCOUNTER — Ambulatory Visit (INDEPENDENT_AMBULATORY_CARE_PROVIDER_SITE_OTHER): Payer: BC Managed Care – PPO

## 2019-09-06 ENCOUNTER — Other Ambulatory Visit: Payer: Self-pay

## 2019-09-06 ENCOUNTER — Ambulatory Visit (INDEPENDENT_AMBULATORY_CARE_PROVIDER_SITE_OTHER): Payer: BC Managed Care – PPO | Admitting: Podiatry

## 2019-09-06 ENCOUNTER — Other Ambulatory Visit: Payer: Self-pay | Admitting: Psychiatry

## 2019-09-06 DIAGNOSIS — M722 Plantar fascial fibromatosis: Secondary | ICD-10-CM | POA: Diagnosis not present

## 2019-09-06 DIAGNOSIS — M84374D Stress fracture, right foot, subsequent encounter for fracture with routine healing: Secondary | ICD-10-CM | POA: Diagnosis not present

## 2019-09-06 DIAGNOSIS — F5105 Insomnia due to other mental disorder: Secondary | ICD-10-CM

## 2019-09-06 NOTE — Patient Instructions (Signed)

## 2019-09-09 NOTE — Progress Notes (Signed)
Subjective: 64 year old female presents the office today for follow evaluation of right heel pain, stress fracture, plantar fasciitis.  She states that she is doing well and she is back to wearing regular shoes however she states that she turned wrong and she started have pain about of her heel again but prior to that she is doing well.  No falls.  No increased swelling or bruising.  She has no new concerns otherwise today. Denies any systemic complaints such as fevers, chills, nausea, vomiting. No acute changes since last appointment, and no other complaints at this time.   Objective: AAO x3, NAD DP/PT pulses palpable bilaterally, CRT less than 3 seconds Tenderness to palpation of plantar medial tubercle of the calcaneus at the insertion of the plantar fascia.  The plantar fascia appears to be intact.  There is no pain with lateral compression of the calcaneus.  No pain with Achilles tendon.  There is no edema, erythema.  Flexor, extensor tendons appear to be intact.  MMT 5/5. No open lesions or pre-ulcerative lesions.  No pain with calf compression, swelling, warmth, erythema  Assessment: Plantar fasciitis  Plan: -All treatment options discussed with the patient including all alternatives, risks, complications.  -Repeat x-rays obtained reviewed.  No evidence of acute fracture or stress fracture identified today. -To do more for her symptoms are from plantar fasciitis.  She is back in the boot because of the pain we discussed starting stretching, rehab exercises as well as icing daily and transition back to a shoe with good arch support as tolerated.  We discussed steroid injection if she continues to have pain we will do this next appointment likely.  Return in about 4 weeks (around 10/04/2019).  Trula Slade DPM

## 2019-09-12 ENCOUNTER — Other Ambulatory Visit: Payer: Self-pay

## 2019-09-12 ENCOUNTER — Telehealth: Payer: Self-pay | Admitting: Psychiatry

## 2019-09-12 ENCOUNTER — Ambulatory Visit
Admission: RE | Admit: 2019-09-12 | Discharge: 2019-09-12 | Disposition: A | Discharge status: Home / Self Care | Payer: BC Managed Care – PPO | Source: Ambulatory Visit | Attending: Obstetrics and Gynecology | Admitting: Obstetrics and Gynecology

## 2019-09-12 DIAGNOSIS — Z1231 Encounter for screening mammogram for malignant neoplasm of breast: Secondary | ICD-10-CM

## 2019-09-12 NOTE — Telephone Encounter (Signed)
Per patient request I called in to customer care pharmacy the following instructions for them to compound naltrexone for this patient.  1.5 mg for 30 days, then 3 mg for 30 days, then 4.5 mg for 30 days.  I could not figure a way out to do appointments that in epic so I did it by phone.  Please call the pharmacy and verify they receive the order and ask if they need clarification.

## 2019-09-12 NOTE — Telephone Encounter (Signed)
Pt called checking status on Rx for Naltrexone 1.5 mg to [facility]. Stated she had taken in the past.

## 2019-09-18 DIAGNOSIS — F411 Generalized anxiety disorder: Secondary | ICD-10-CM | POA: Diagnosis not present

## 2019-09-18 DIAGNOSIS — F4310 Post-traumatic stress disorder, unspecified: Secondary | ICD-10-CM | POA: Diagnosis not present

## 2019-09-21 NOTE — Telephone Encounter (Signed)
Please call [facility] and verify they got this order.  I don't see a note from [facility] that she ever called them.

## 2019-09-21 NOTE — Telephone Encounter (Signed)
Spoke with pharmacy. They did receive this order. The last one they received was in March. She also has a few refills as well on the 4.5 mg

## 2019-09-27 DIAGNOSIS — Z23 Encounter for immunization: Secondary | ICD-10-CM | POA: Diagnosis not present

## 2019-09-27 DIAGNOSIS — E7800 Pure hypercholesterolemia, unspecified: Secondary | ICD-10-CM | POA: Diagnosis not present

## 2019-09-27 DIAGNOSIS — E559 Vitamin D deficiency, unspecified: Secondary | ICD-10-CM | POA: Diagnosis not present

## 2019-09-27 DIAGNOSIS — L659 Nonscarring hair loss, unspecified: Secondary | ICD-10-CM | POA: Diagnosis not present

## 2019-09-27 DIAGNOSIS — N183 Chronic kidney disease, stage 3 unspecified: Secondary | ICD-10-CM | POA: Diagnosis not present

## 2019-09-27 DIAGNOSIS — Z0000 Encounter for general adult medical examination without abnormal findings: Secondary | ICD-10-CM | POA: Diagnosis not present

## 2019-10-02 DIAGNOSIS — F4310 Post-traumatic stress disorder, unspecified: Secondary | ICD-10-CM | POA: Diagnosis not present

## 2019-10-02 DIAGNOSIS — F411 Generalized anxiety disorder: Secondary | ICD-10-CM | POA: Diagnosis not present

## 2019-10-04 ENCOUNTER — Other Ambulatory Visit: Payer: Self-pay

## 2019-10-04 ENCOUNTER — Ambulatory Visit (INDEPENDENT_AMBULATORY_CARE_PROVIDER_SITE_OTHER): Payer: BC Managed Care – PPO | Admitting: Podiatry

## 2019-10-04 DIAGNOSIS — M722 Plantar fascial fibromatosis: Secondary | ICD-10-CM

## 2019-10-04 NOTE — Patient Instructions (Signed)

## 2019-10-09 DIAGNOSIS — F4310 Post-traumatic stress disorder, unspecified: Secondary | ICD-10-CM | POA: Diagnosis not present

## 2019-10-09 DIAGNOSIS — F411 Generalized anxiety disorder: Secondary | ICD-10-CM | POA: Diagnosis not present

## 2019-10-10 NOTE — Progress Notes (Signed)
Subjective: 64 year old female presents the office today for follow-up evaluation of right heel pain that wanted fasciitis.  Overall she states that she has stated she feels that she is doing better other days she has pain.  Is mostly just to the bottom of the heel.  No rating pain or weakness.  No recent injury or falls no other concerns today. Denies any systemic complaints such as fevers, chills, nausea, vomiting. No acute changes since last appointment, and no other complaints at this time.   Objective: AAO x3, NAD DP/PT pulses palpable bilaterally, CRT less than 3 seconds There is tenderness palpation of the plantar medial tubercle of the calcaneus at the insertion plantar fascia.  The plantar fascial appears to be intact.  There is no pain with lateral compression of calcaneus or to the Achilles tendon.  There is no edema, erythema.  No pain with calf compression, swelling, warmth, erythema  Assessment: Plantar fasciitis right foot  Plan: -All treatment options discussed with the patient including all alternatives, risks, complications.  -At this point her symptoms are localized to the plantar fascia.  A steroid injection was performed today.  See procedure note below.  Continue stretching, icing daily as well as wearing supportive shoes and discussed orthotics today. -Patient encouraged to call the office with any questions, concerns, change in symptoms.   Procedure: Injection Tendon/Ligament Discussed alternatives, risks, complications and verbal consent was obtained.  Location: Right plantar fascia at the glabrous junction; medial approach. Skin Prep: Alcohol  Injectate: 0.5cc 0.5% marcaine plain, 0.5 cc 2% lidocaine plain and, 1 cc kenalog 10. Disposition: Patient tolerated procedure well. Injection site dressed with a band-aid.  Post-injection care was discussed and return precautions discussed.   Return if symptoms worsen or fail to improve.  Trula Slade DPM

## 2019-10-11 ENCOUNTER — Other Ambulatory Visit: Payer: Self-pay

## 2019-10-11 ENCOUNTER — Telehealth: Payer: Self-pay | Admitting: Psychiatry

## 2019-10-11 DIAGNOSIS — F5105 Insomnia due to other mental disorder: Secondary | ICD-10-CM

## 2019-10-11 NOTE — Telephone Encounter (Signed)
Pt called reporting [facility] would not fill Zolpidem (Ambien) 5 mg. PROVIDER DENIED.  She has 2 RF. Last filled 8/7. Will need to pick up Friday leaving for beach Saturday. [facility] location.  Please call pharmacy.

## 2019-10-11 NOTE — Telephone Encounter (Signed)
Patient should have 2 refills on file, her last refill was 09/10/19. Not sure why pharmacy wouldn't fill. Will resend Rx

## 2019-10-12 MED ORDER — ZOLPIDEM TARTRATE 5 MG PO TABS: ORAL_TABLET | ORAL | 2 refills | Status: DC

## 2019-10-23 DIAGNOSIS — F411 Generalized anxiety disorder: Secondary | ICD-10-CM | POA: Diagnosis not present

## 2019-10-23 DIAGNOSIS — F4310 Post-traumatic stress disorder, unspecified: Secondary | ICD-10-CM | POA: Diagnosis not present

## 2019-10-30 DIAGNOSIS — Z23 Encounter for immunization: Secondary | ICD-10-CM | POA: Diagnosis not present

## 2019-11-06 DIAGNOSIS — F4310 Post-traumatic stress disorder, unspecified: Secondary | ICD-10-CM | POA: Diagnosis not present

## 2019-11-06 DIAGNOSIS — F411 Generalized anxiety disorder: Secondary | ICD-10-CM | POA: Diagnosis not present

## 2019-11-09 DIAGNOSIS — Z6830 Body mass index (BMI) 30.0-30.9, adult: Secondary | ICD-10-CM | POA: Diagnosis not present

## 2019-11-09 DIAGNOSIS — Z01419 Encounter for gynecological examination (general) (routine) without abnormal findings: Secondary | ICD-10-CM | POA: Diagnosis not present

## 2019-11-09 DIAGNOSIS — Z13820 Encounter for screening for osteoporosis: Secondary | ICD-10-CM | POA: Diagnosis not present

## 2019-11-16 ENCOUNTER — Other Ambulatory Visit: Payer: Self-pay | Admitting: Psychiatry

## 2019-11-16 DIAGNOSIS — F331 Major depressive disorder, recurrent, moderate: Secondary | ICD-10-CM

## 2019-11-16 NOTE — Telephone Encounter (Signed)
review 

## 2019-11-16 NOTE — Telephone Encounter (Signed)
Phoned in.

## 2019-11-20 DIAGNOSIS — F411 Generalized anxiety disorder: Secondary | ICD-10-CM | POA: Diagnosis not present

## 2019-11-20 DIAGNOSIS — F4310 Post-traumatic stress disorder, unspecified: Secondary | ICD-10-CM | POA: Diagnosis not present

## 2019-11-21 DIAGNOSIS — D3132 Benign neoplasm of left choroid: Secondary | ICD-10-CM | POA: Diagnosis not present

## 2019-11-21 DIAGNOSIS — H5202 Hypermetropia, left eye: Secondary | ICD-10-CM | POA: Diagnosis not present

## 2019-11-21 DIAGNOSIS — H53001 Unspecified amblyopia, right eye: Secondary | ICD-10-CM | POA: Diagnosis not present

## 2019-11-21 DIAGNOSIS — H25042 Posterior subcapsular polar age-related cataract, left eye: Secondary | ICD-10-CM | POA: Diagnosis not present

## 2019-11-22 ENCOUNTER — Other Ambulatory Visit: Payer: Self-pay | Admitting: Psychiatry

## 2019-11-22 DIAGNOSIS — F4310 Post-traumatic stress disorder, unspecified: Secondary | ICD-10-CM

## 2019-11-22 DIAGNOSIS — F411 Generalized anxiety disorder: Secondary | ICD-10-CM

## 2019-12-04 DIAGNOSIS — F411 Generalized anxiety disorder: Secondary | ICD-10-CM | POA: Diagnosis not present

## 2019-12-04 DIAGNOSIS — F4310 Post-traumatic stress disorder, unspecified: Secondary | ICD-10-CM | POA: Diagnosis not present

## 2019-12-18 DIAGNOSIS — F411 Generalized anxiety disorder: Secondary | ICD-10-CM | POA: Diagnosis not present

## 2019-12-18 DIAGNOSIS — F4310 Post-traumatic stress disorder, unspecified: Secondary | ICD-10-CM | POA: Diagnosis not present

## 2019-12-20 ENCOUNTER — Telehealth: Payer: Self-pay | Admitting: Psychiatry

## 2019-12-20 NOTE — Telephone Encounter (Signed)
Pt called requesting refill for Alprazolam @ [facility]. [address]. Pharmacy not open on weekend would like to be able to pick up Friday.

## 2019-12-21 ENCOUNTER — Other Ambulatory Visit: Payer: Self-pay

## 2019-12-21 MED ORDER — ALPRAZOLAM 0.25 MG PO TABS: ORAL_TABLET | ORAL | 0 refills | Status: DC

## 2019-12-21 NOTE — Telephone Encounter (Signed)
Pended for Dr. Cottle to review and send 

## 2020-01-07 DIAGNOSIS — L309 Dermatitis, unspecified: Secondary | ICD-10-CM | POA: Diagnosis not present

## 2020-01-11 ENCOUNTER — Other Ambulatory Visit: Payer: Self-pay | Admitting: Psychiatry

## 2020-01-15 DIAGNOSIS — F4310 Post-traumatic stress disorder, unspecified: Secondary | ICD-10-CM | POA: Diagnosis not present

## 2020-01-15 DIAGNOSIS — F411 Generalized anxiety disorder: Secondary | ICD-10-CM | POA: Diagnosis not present

## 2020-02-11 ENCOUNTER — Other Ambulatory Visit: Payer: Self-pay | Admitting: Neurology

## 2020-02-13 DIAGNOSIS — F4310 Post-traumatic stress disorder, unspecified: Secondary | ICD-10-CM | POA: Diagnosis not present

## 2020-02-13 DIAGNOSIS — F411 Generalized anxiety disorder: Secondary | ICD-10-CM | POA: Diagnosis not present

## 2020-02-14 ENCOUNTER — Other Ambulatory Visit: Payer: Self-pay | Admitting: Psychiatry

## 2020-02-14 ENCOUNTER — Other Ambulatory Visit: Payer: Self-pay | Admitting: Neurology

## 2020-02-14 DIAGNOSIS — F331 Major depressive disorder, recurrent, moderate: Secondary | ICD-10-CM

## 2020-02-17 ENCOUNTER — Other Ambulatory Visit: Payer: Self-pay | Admitting: Psychiatry

## 2020-02-17 DIAGNOSIS — F4310 Post-traumatic stress disorder, unspecified: Secondary | ICD-10-CM

## 2020-02-17 DIAGNOSIS — F411 Generalized anxiety disorder: Secondary | ICD-10-CM

## 2020-02-22 DIAGNOSIS — K6389 Other specified diseases of intestine: Secondary | ICD-10-CM | POA: Diagnosis not present

## 2020-02-22 DIAGNOSIS — R197 Diarrhea, unspecified: Secondary | ICD-10-CM | POA: Diagnosis not present

## 2020-02-25 ENCOUNTER — Ambulatory Visit: Payer: BC Managed Care – PPO | Admitting: Psychiatry

## 2020-02-26 DIAGNOSIS — F411 Generalized anxiety disorder: Secondary | ICD-10-CM | POA: Diagnosis not present

## 2020-02-26 DIAGNOSIS — F4310 Post-traumatic stress disorder, unspecified: Secondary | ICD-10-CM | POA: Diagnosis not present

## 2020-03-05 ENCOUNTER — Ambulatory Visit (INDEPENDENT_AMBULATORY_CARE_PROVIDER_SITE_OTHER): Payer: BC Managed Care – PPO | Admitting: Psychiatry

## 2020-03-05 ENCOUNTER — Encounter: Payer: Self-pay | Admitting: Psychiatry

## 2020-03-05 ENCOUNTER — Other Ambulatory Visit: Payer: Self-pay

## 2020-03-05 DIAGNOSIS — F411 Generalized anxiety disorder: Secondary | ICD-10-CM

## 2020-03-05 DIAGNOSIS — G3184 Mild cognitive impairment, so stated: Secondary | ICD-10-CM

## 2020-03-05 DIAGNOSIS — F4310 Post-traumatic stress disorder, unspecified: Secondary | ICD-10-CM | POA: Diagnosis not present

## 2020-03-05 DIAGNOSIS — F5105 Insomnia due to other mental disorder: Secondary | ICD-10-CM

## 2020-03-05 DIAGNOSIS — F331 Major depressive disorder, recurrent, moderate: Secondary | ICD-10-CM

## 2020-03-05 DIAGNOSIS — F3341 Major depressive disorder, recurrent, in partial remission: Secondary | ICD-10-CM

## 2020-03-05 MED ORDER — LAMOTRIGINE 150 MG PO TABS: 150.0000 mg | ORAL_TABLET | Freq: Every evening | ORAL | 1 refills | Status: DC

## 2020-03-05 MED ORDER — ZOLPIDEM TARTRATE 5 MG PO TABS
ORAL_TABLET | ORAL | 2 refills | Status: DC
Start: 2020-03-05 — End: 2020-07-02

## 2020-03-05 MED ORDER — ALPRAZOLAM 0.25 MG PO TABS: ORAL_TABLET | ORAL | 0 refills | Status: DC

## 2020-03-05 NOTE — Progress Notes (Signed)
Hitomi Lyden Brabant [id number] [birth year] 65 y.o.  Subjective:   Patient ID:  Lorraine May is a 65 y.o. (DOB [birth year]) female.  Chief Complaint:  Chief Complaint  Patient presents with  . Follow-up  . Anxiety  . Depression    Anxiety    Depression        Associated symptoms include fatigue and myalgias.  Past medical history includes anxiety.    Deilani Jetty Rowand presents to the office today for follow-up of Hx TRD with benefit from [place] 2016-8 at [facility].   Verty effective.  seen June 2020.  The following changes were made: OK trial reduction lamotrigine to 1/2 tablet in the morning and 1 in PM to see if memory is better. Discussed the risk of relapse and if it occurs to increase the medication back to the full dosage. NAC N-acetylcysteine 600 mg daily.  Off label for cognitive complaints including word-finding and STM issues.  Jan 2021 appt with the following noted: No changes with reduction in lamotrigine in cognition.  Added NAC.  Asks about Cerefolin NAC again.   Forgets details in conversation. STM px.  Past history of neuropsych testing which dx MCI.  Decided not to move again and that's a good thing.  Had wondered about reducing the meds but Covid and civil unrest and politics disturbing.  Disturbing over the depth of hatred in the country.  Telehealth is bothersome.  Concerned memory is not as good as in the past and wonders if related to meds. No sig Xanax. Patient reports stable mood and denies depressed or irritable moods.   Patient denies difficulty with sleep initiation or maintenance.  Sleep fairly well with gabapentin and naltrexone. No med changes.  08/29/19 appt with the following noted: Vaccinated. Donepezil 5 without benefit and caused diarrhea so stopped. Stopped naltrexone bc decided it didn't help neuropathy. Patient reports mild depressed and worry over Covid.  Patient denies difficulty with sleep initiation or maintenance. Denies appetite disturbance.  Patient  reports that energy and motivation have been good.  Patient denies any difficulty with concentration.  Patient denies any suicidal ideation. Wonders about restarting naltrexone at low dose by [employer].bc it did help sleep some.  Asks I RX it. Having to use Ambien now bc foot problems. Has continued [name]. Recognizes some avoidance. Plan: Wrote prescription for patient to resume low-dose naltrexone which she had previously taken per her request  03/05/2020 appointment with the following noted: Xanax prn occ. 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  It was hard.  Moved 2 mos ago. Anxiety inducing from niece with mental health problems including paranoid delusional.  Not in consistent treatment.    Not sig depressed.  Pandemic wearing. Switched lamotrigine to 150 pm and feels knocked out in the evening.  Past Psychiatric Medication Trials:   Nortriptyline side effects, Trintellix loss of benefit, duloxetine, mirtazapine, venlafaxine side effects, sertraline, Pristiq, Viibryd, Wellbutrin,  Depakote, carbamazepine,  lamotrigine, Seroquel, Saphris, Geodon,  TMS helped,  trazodone no response,  buspirone, gabapentin  History of gastric bypass affects the absorption of some meds.   Review of Systems:  Review of Systems  Constitutional: Positive for fatigue.  Gastrointestinal: Positive for diarrhea.  Musculoskeletal: Positive for arthralgias, back pain, joint swelling and myalgias.  Neurological: Negative for tremors and weakness.       Bruning neuropathy foot and legs   Psychiatric/Behavioral: Positive for depression.  Sx worse with stress  Medications: I have reviewed the patient's current medications.  Current Outpatient  Medications  Medication Sig Dispense Refill  . busPIRone (BUSPAR) 30 MG tablet TAKE 1 TABLET(30 MG) BY MOUTH TWICE DAILY 180 tablet 0  . colestipol (COLESTID) 1 g tablet Take 1 g by mouth daily.   0  . dicyclomine (BENTYL) 20 MG tablet Take 20 mg by  mouth as needed for spasms.     Marland Kitchen gabapentin (NEURONTIN) 100 MG capsule TAKE 2 CAPSULES BY MOUTH EVERY MORNING AND 2 AT MIDDAY AND 3 EVERY EVENING. Appointment needed prior to further refills 630 capsule 0  . Methylfol-Algae-B12-Acetylcyst (CEREFOLIN NAC) [id number] MG TABS TAKE 1 TABLET BY MOUTH EVERY DAY 90 tablet 3  . simvastatin (ZOCOR) 40 MG tablet TK 1 T PO IN THE EVENING  4  . valACYclovir (VALTREX) 500 MG tablet Take 4 tablets by mouth. Take 4 tablets and then another 4 tablets 12 hours later as needed for fever blister  0  . Vitamin D, Ergocalciferol, (DRISDOL) 50000 units CAPS capsule TK 1 C PO ONCE A WK FOR 3 WKS A MONTH  2  . ALPRAZolam (XANAX) 0.25 MG tablet TAKE 1 TABLET(0.25 MG) BY MOUTH TWICE DAILY AS NEEDED FOR ANXIETY 15 tablet 0  . lamoTRIgine (LAMICTAL) 150 MG tablet Take 1 tablet (150 mg total) by mouth at bedtime. 90 tablet 1  . zolpidem (AMBIEN) 5 MG tablet Take 1 tablet by mouth at bedtime as needed for sleep 30 tablet 2   No current facility-administered medications for this visit.    Medication Side Effects: None  Allergies:  Allergies  Allergen Reactions  . Demeclocycline Nausea Only  . Dilaudid [Hydromorphone Hcl] Itching  . Fentanyl Itching  . Morphine And Related Itching  . Nsaids     Cannot take due to kidney disease  . Percocet [Oxycodone-Acetaminophen]     itching  . Tetracyclines & Related Nausea Only  . Sulfa Antibiotics Rash    Past Medical History:  Diagnosis Date  . Anxiety   . Anxiety and depression 08/24/2011  . Arthritis   . Cancer ([place])    colon  . Depression   . Hx of colon cancer, stage II 08/24/2011   T3N0  adenoca cecum resected 04/2004  Xeloda adjuvant chemo  . Hyperlipidemia 08/24/2011  . Joint pain   . Nausea & vomiting   . Neuropathy   . PONV (postoperative nausea and vomiting)   . Superficial basal cell carcinoma (BCC) 09/18/2012   Right Temple    Family History  Problem Relation Age of Onset  . Diabetes Mother         type 1  . Stroke Mother   . Dementia Mother   . Thyroid disease Mother   . Alcohol abuse Mother   . Asthma Mother   . Alcohol abuse Father   . Diabetes Father   . Cancer Paternal Aunt        germ cell  . Cancer Maternal Grandmother        colon  . Cancer Maternal Uncle        throat  . Thyroid disease Sister   . Alcohol abuse Brother   . Psychiatric Illness Brother   . Psychiatric Illness Sister   . Asthma Sister   . Neuropathy Neg Hx     Social History   Socioeconomic History  . Marital status: Married    Spouse name: Herbie [place]  . Number of children: 1  . Years of education: 37  . Highest education level: Not on file  Occupational History  . Occupation: Press photographer-  Retired  Tobacco Use  . Smoking status: Never Smoker  . Smokeless tobacco: Never Used  Vaping Use  . Vaping Use: Never used  Substance and Sexual Activity  . Alcohol use: Yes    Comment: occ  . Drug use: No  . Sexual activity: Not on file  Other Topics Concern  . Not on file  Social History Narrative   Lives with husband   Caffeine use: 2-3 cups daily   Right handed   Social Determinants of Health   Financial Resource Strain: Not on file  Food Insecurity: Not on file  Transportation Needs: Not on file  Physical Activity: Not on file  Stress: Not on file  Social Connections: Not on file  Intimate Partner Violence: Not on file    Past Medical History, Surgical history, Social history, and Family history were reviewed and updated as appropriate.   Please see review of systems for further details on the patient's review from today.   Objective:   Physical Exam:  There were no vitals taken for this visit.  Physical Exam Constitutional:      General: She is not in acute distress.    Appearance: She is well-developed.  Musculoskeletal:        General: No deformity.  Neurological:     Mental Status: She is alert and oriented to person, place, and time.     Motor: No tremor.      Coordination: Coordination normal.     Gait: Gait normal.  Psychiatric:        Attention and Perception: She does not perceive auditory hallucinations.        Mood and Affect: Mood is anxious. Mood is not depressed. Affect is not labile, blunt, angry or inappropriate.        Speech: Speech normal.        Behavior: Behavior normal.        Thought Content: Thought content normal. Thought content does not include homicidal or suicidal ideation. Thought content does not include homicidal or suicidal plan.        Cognition and Memory: She exhibits impaired recent memory.        Judgment: Judgment normal.     Comments: Insight intact. No auditory or visual hallucinations.  Somatic under stress.  But less so at this time     Lab Review:     Component Value Date/Time   NA 140 02/02/2017 1554   NA 142 08/21/2013 1445   K 5.2 02/02/2017 1554   K 4.1 08/21/2013 1445   CL 102 02/02/2017 1554   CO2 24 02/02/2017 1554   CO2 26 08/21/2013 1445   GLUCOSE 97 02/02/2017 1554   GLUCOSE 120 (H) 02/07/2014 1107   GLUCOSE 91 08/21/2013 1445   BUN 19 02/02/2017 1554   BUN 18.7 08/21/2013 1445   CREATININE 1.17 (H) 02/02/2017 1554   CREATININE 1.1 08/21/2013 1445   CALCIUM 9.3 02/02/2017 1554   CALCIUM 9.1 08/21/2013 1445   PROT 6.6 03/03/2016 1124   PROT 6.5 08/21/2013 1445   ALBUMIN 4.7 02/07/2014 1107   ALBUMIN 3.7 08/21/2013 1445   AST 21 02/07/2014 1107   AST 12 08/21/2013 1445   ALT 14 02/07/2014 1107   ALT 11 08/21/2013 1445   ALKPHOS 64 02/07/2014 1107   ALKPHOS 56 08/21/2013 1445   BILITOT 0.6 02/07/2014 1107   BILITOT 0.27 08/21/2013 1445   GFRNONAA 50 (L) 02/02/2017 1554   GFRAA 57 (L) 02/02/2017 1554  Component Value Date/Time   WBC 6.3 02/07/2014 1107   RBC 4.28 02/07/2014 1107   HGB 12.9 02/07/2014 1107   HGB 12.3 08/21/2013 1445   HCT 38.7 02/07/2014 1107   HCT 37.2 08/21/2013 1445   PLT 253 02/07/2014 1107   PLT 226 08/21/2013 1445   MCV 90.4 02/07/2014 1107    MCV 93.2 08/21/2013 1445   MCH 30.1 02/07/2014 1107   MCHC 33.3 02/07/2014 1107   RDW 13.2 02/07/2014 1107   RDW 13.8 08/21/2013 1445   LYMPHSABS 1.5 08/21/2013 1445   MONOABS 0.3 08/21/2013 1445   EOSABS 0.1 08/21/2013 1445   BASOSABS 0.0 08/21/2013 1445    No results found for: POCLITH, LITHIUM   No results found for: PHENYTOIN, PHENOBARB, VALPROATE, CBMZ   .res Assessment: Plan:    Lanay was seen today for follow-up, anxiety and depression.  Diagnoses and all orders for this visit:  Recurrent major depression in partial remission ([place])  Generalized anxiety disorder -     ALPRAZolam (XANAX) 0.25 MG tablet; TAKE 1 TABLET(0.25 MG) BY MOUTH TWICE DAILY AS NEEDED FOR ANXIETY  PTSD (post-traumatic stress disorder) -     ALPRAZolam (XANAX) 0.25 MG tablet; TAKE 1 TABLET(0.25 MG) BY MOUTH TWICE DAILY AS NEEDED FOR ANXIETY  Mild cognitive impairment  Insomnia due to mental condition -     zolpidem (AMBIEN) 5 MG tablet; Take 1 tablet by mouth at bedtime as needed for sleep  Major depressive disorder, recurrent episode, moderate (HCC) -     lamoTRIgine (LAMICTAL) 150 MG tablet; Take 1 tablet (150 mg total) by mouth at bedtime.    As can be seen the patient is failed a long list of psychiatric medications.  Fortunately she did respond to [place] for depression.  Cont meds that are working and tolerating well.  Maintained the TMS benefit for depression.  Marland Kitchen    Anxiety symptoms are worse with Covid and worry over neice.    She is not sure if the naltrexone has helped neuropathy.  The gabapentin just clearly does.  We discussed the potential cognitive side effects of gabapentin but she feels she needs this medicine.  She wants to continue the naltrexone off label for this purpose which was also recommended by her alternative medicine doctor.  Switch supplements to [employer] which she took in the past for MCI.  Adding the methyl folate may be additionally helpful over regular folic  acid.  It also reduces the number of tablets she has to take.  Her vitamin B12 level was not low when she took this form previously.  Has continued telehealth with therapist [name].  Disc potential value of of lithium as a neuro protective agent with some studies showing the benefit of reducing the development of Alzheimer's disease in patients with mild cognitive impairment.  We will use ultra low-dose 150 mg daily.  She is fearful but wants to consider.  Gave copy of Dr. Narda Rutherford article.  We discussed side effects of lithium in detail.  Because of continued neurocognitive complaints with a history of diagnosis of mild cognitive impairment by neuropsych testing we rec lithium but she has stigma about it.  switch lamotrigine to 150 mg 1/2 tab BID bc sleepiness in evening.  I would not advise further dose reduction.    Lamotrigine is being used to reduce risk of recurrent depression given multiple med failures as noted above.  Her sleep is managed but we also discussed the potential that zolpidem is interfering with  her memory.  Consider alternatives.  This appt was 30 mins.  FU 6 mos.  Lynder Parents, MD, DFAPA   Please see After Visit Summary for patient specific instructions.  No future appointments.  No orders of the defined types were placed in this encounter.     -------------------------------

## 2020-03-11 DIAGNOSIS — F4310 Post-traumatic stress disorder, unspecified: Secondary | ICD-10-CM | POA: Diagnosis not present

## 2020-03-11 DIAGNOSIS — F411 Generalized anxiety disorder: Secondary | ICD-10-CM | POA: Diagnosis not present

## 2020-03-25 DIAGNOSIS — F4310 Post-traumatic stress disorder, unspecified: Secondary | ICD-10-CM | POA: Diagnosis not present

## 2020-03-25 DIAGNOSIS — F411 Generalized anxiety disorder: Secondary | ICD-10-CM | POA: Diagnosis not present

## 2020-04-08 DIAGNOSIS — F411 Generalized anxiety disorder: Secondary | ICD-10-CM | POA: Diagnosis not present

## 2020-04-08 DIAGNOSIS — F4310 Post-traumatic stress disorder, unspecified: Secondary | ICD-10-CM | POA: Diagnosis not present

## 2020-04-18 DIAGNOSIS — H53001 Unspecified amblyopia, right eye: Secondary | ICD-10-CM | POA: Diagnosis not present

## 2020-04-18 DIAGNOSIS — H25042 Posterior subcapsular polar age-related cataract, left eye: Secondary | ICD-10-CM | POA: Diagnosis not present

## 2020-04-18 DIAGNOSIS — H52203 Unspecified astigmatism, bilateral: Secondary | ICD-10-CM | POA: Diagnosis not present

## 2020-04-22 DIAGNOSIS — F411 Generalized anxiety disorder: Secondary | ICD-10-CM | POA: Diagnosis not present

## 2020-04-22 DIAGNOSIS — F4310 Post-traumatic stress disorder, unspecified: Secondary | ICD-10-CM | POA: Diagnosis not present

## 2020-05-06 DIAGNOSIS — F411 Generalized anxiety disorder: Secondary | ICD-10-CM | POA: Diagnosis not present

## 2020-05-06 DIAGNOSIS — F4310 Post-traumatic stress disorder, unspecified: Secondary | ICD-10-CM | POA: Diagnosis not present

## 2020-05-12 ENCOUNTER — Other Ambulatory Visit: Payer: Self-pay | Admitting: Psychiatry

## 2020-05-12 DIAGNOSIS — G3184 Mild cognitive impairment, so stated: Secondary | ICD-10-CM

## 2020-05-13 NOTE — Telephone Encounter (Signed)
Please review

## 2020-05-20 DIAGNOSIS — F411 Generalized anxiety disorder: Secondary | ICD-10-CM | POA: Diagnosis not present

## 2020-05-20 DIAGNOSIS — F4310 Post-traumatic stress disorder, unspecified: Secondary | ICD-10-CM | POA: Diagnosis not present

## 2020-05-21 DIAGNOSIS — F4481 Dissociative identity disorder: Secondary | ICD-10-CM | POA: Diagnosis not present

## 2020-05-21 DIAGNOSIS — F4310 Post-traumatic stress disorder, unspecified: Secondary | ICD-10-CM | POA: Diagnosis not present

## 2020-05-25 ENCOUNTER — Other Ambulatory Visit: Payer: Self-pay | Admitting: Psychiatry

## 2020-05-25 DIAGNOSIS — F411 Generalized anxiety disorder: Secondary | ICD-10-CM

## 2020-05-25 DIAGNOSIS — F4310 Post-traumatic stress disorder, unspecified: Secondary | ICD-10-CM

## 2020-05-26 ENCOUNTER — Telehealth: Payer: Self-pay | Admitting: Neurology

## 2020-05-26 ENCOUNTER — Other Ambulatory Visit: Payer: Self-pay | Admitting: Neurology

## 2020-05-26 MED ORDER — GABAPENTIN 100 MG PO CAPS: ORAL_CAPSULE | ORAL | 0 refills | Status: DC

## 2020-05-26 NOTE — Telephone Encounter (Signed)
Pt called and scheduled an appt to be able to get her gabapentin (NEURONTIN) 100 MG capsule filled at the [facility] on [address]. Pt is wanting to know if this can be called in today due to her leaving on vacation soon. Please advise.

## 2020-05-27 ENCOUNTER — Other Ambulatory Visit: Payer: Self-pay | Admitting: Psychiatry

## 2020-05-27 ENCOUNTER — Telehealth: Payer: Self-pay | Admitting: Psychiatry

## 2020-05-27 DIAGNOSIS — G3184 Mild cognitive impairment, so stated: Secondary | ICD-10-CM

## 2020-05-27 MED ORDER — LITHIUM CARBONATE 150 MG PO CAPS: 150.0000 mg | ORAL_CAPSULE | Freq: Every day | ORAL | 3 refills | Status: DC

## 2020-05-27 NOTE — Telephone Encounter (Signed)
Buspar sent but I don't see the lithium

## 2020-05-27 NOTE — Telephone Encounter (Signed)
Next visit is 09/22/20. Requesting refill on Lithium and Buspirone called to:  [facility] [place], [place] Phone:  [phone]  Fax:  [phone]

## 2020-06-17 DIAGNOSIS — F4310 Post-traumatic stress disorder, unspecified: Secondary | ICD-10-CM | POA: Diagnosis not present

## 2020-06-17 DIAGNOSIS — F411 Generalized anxiety disorder: Secondary | ICD-10-CM | POA: Diagnosis not present

## 2020-07-01 ENCOUNTER — Other Ambulatory Visit: Payer: Self-pay | Admitting: Psychiatry

## 2020-07-01 DIAGNOSIS — F5105 Insomnia due to other mental disorder: Secondary | ICD-10-CM

## 2020-07-01 DIAGNOSIS — F411 Generalized anxiety disorder: Secondary | ICD-10-CM | POA: Diagnosis not present

## 2020-07-01 DIAGNOSIS — F4310 Post-traumatic stress disorder, unspecified: Secondary | ICD-10-CM | POA: Diagnosis not present

## 2020-07-02 NOTE — Telephone Encounter (Signed)
Last filled 05/22/20 appt on 09/02/20

## 2020-07-15 DIAGNOSIS — F4310 Post-traumatic stress disorder, unspecified: Secondary | ICD-10-CM | POA: Diagnosis not present

## 2020-07-15 DIAGNOSIS — F411 Generalized anxiety disorder: Secondary | ICD-10-CM | POA: Diagnosis not present

## 2020-07-16 DIAGNOSIS — M67861 Other specified disorders of synovium, right knee: Secondary | ICD-10-CM | POA: Diagnosis not present

## 2020-07-29 DIAGNOSIS — F411 Generalized anxiety disorder: Secondary | ICD-10-CM | POA: Diagnosis not present

## 2020-07-29 DIAGNOSIS — F4310 Post-traumatic stress disorder, unspecified: Secondary | ICD-10-CM | POA: Diagnosis not present

## 2020-07-30 ENCOUNTER — Other Ambulatory Visit: Payer: Self-pay | Admitting: Obstetrics and Gynecology

## 2020-07-30 DIAGNOSIS — M25562 Pain in left knee: Secondary | ICD-10-CM | POA: Diagnosis not present

## 2020-07-30 DIAGNOSIS — M25561 Pain in right knee: Secondary | ICD-10-CM | POA: Diagnosis not present

## 2020-07-30 DIAGNOSIS — Z1231 Encounter for screening mammogram for malignant neoplasm of breast: Secondary | ICD-10-CM

## 2020-08-06 ENCOUNTER — Ambulatory Visit: Payer: BC Managed Care – PPO | Admitting: Psychiatry

## 2020-08-06 DIAGNOSIS — M25562 Pain in left knee: Secondary | ICD-10-CM | POA: Diagnosis not present

## 2020-08-06 DIAGNOSIS — M25561 Pain in right knee: Secondary | ICD-10-CM | POA: Diagnosis not present

## 2020-08-07 DIAGNOSIS — N390 Urinary tract infection, site not specified: Secondary | ICD-10-CM | POA: Diagnosis not present

## 2020-08-07 DIAGNOSIS — R309 Painful micturition, unspecified: Secondary | ICD-10-CM | POA: Diagnosis not present

## 2020-08-12 DIAGNOSIS — F4310 Post-traumatic stress disorder, unspecified: Secondary | ICD-10-CM | POA: Diagnosis not present

## 2020-08-12 DIAGNOSIS — F411 Generalized anxiety disorder: Secondary | ICD-10-CM | POA: Diagnosis not present

## 2020-08-13 DIAGNOSIS — M25561 Pain in right knee: Secondary | ICD-10-CM | POA: Diagnosis not present

## 2020-08-20 DIAGNOSIS — M25561 Pain in right knee: Secondary | ICD-10-CM | POA: Diagnosis not present

## 2020-08-25 ENCOUNTER — Other Ambulatory Visit: Payer: Self-pay | Admitting: Psychiatry

## 2020-08-25 ENCOUNTER — Other Ambulatory Visit: Payer: Self-pay | Admitting: Neurology

## 2020-08-25 DIAGNOSIS — F411 Generalized anxiety disorder: Secondary | ICD-10-CM

## 2020-08-25 DIAGNOSIS — F4310 Post-traumatic stress disorder, unspecified: Secondary | ICD-10-CM

## 2020-08-26 DIAGNOSIS — F411 Generalized anxiety disorder: Secondary | ICD-10-CM | POA: Diagnosis not present

## 2020-08-26 DIAGNOSIS — F4310 Post-traumatic stress disorder, unspecified: Secondary | ICD-10-CM | POA: Diagnosis not present

## 2020-08-27 DIAGNOSIS — M25561 Pain in right knee: Secondary | ICD-10-CM | POA: Diagnosis not present

## 2020-09-02 ENCOUNTER — Ambulatory Visit (INDEPENDENT_AMBULATORY_CARE_PROVIDER_SITE_OTHER): Payer: BC Managed Care – PPO | Admitting: Psychiatry

## 2020-09-02 ENCOUNTER — Other Ambulatory Visit: Payer: Self-pay

## 2020-09-02 ENCOUNTER — Encounter: Payer: Self-pay | Admitting: Psychiatry

## 2020-09-02 DIAGNOSIS — F3341 Major depressive disorder, recurrent, in partial remission: Secondary | ICD-10-CM | POA: Diagnosis not present

## 2020-09-02 DIAGNOSIS — F411 Generalized anxiety disorder: Secondary | ICD-10-CM

## 2020-09-02 DIAGNOSIS — F4310 Post-traumatic stress disorder, unspecified: Secondary | ICD-10-CM

## 2020-09-02 DIAGNOSIS — M25561 Pain in right knee: Secondary | ICD-10-CM | POA: Diagnosis not present

## 2020-09-02 DIAGNOSIS — F5105 Insomnia due to other mental disorder: Secondary | ICD-10-CM | POA: Diagnosis not present

## 2020-09-02 DIAGNOSIS — G3184 Mild cognitive impairment, so stated: Secondary | ICD-10-CM

## 2020-09-02 DIAGNOSIS — F331 Major depressive disorder, recurrent, moderate: Secondary | ICD-10-CM

## 2020-09-02 MED ORDER — BUSPIRONE HCL 30 MG PO TABS: 30.0000 mg | ORAL_TABLET | Freq: Two times a day (BID) | ORAL | 1 refills | Status: DC

## 2020-09-02 MED ORDER — LAMOTRIGINE 150 MG PO TABS: 150.0000 mg | ORAL_TABLET | Freq: Every evening | ORAL | 1 refills | Status: DC

## 2020-09-02 MED ORDER — ZOLPIDEM TARTRATE 5 MG PO TABS: 5.0000 mg | ORAL_TABLET | Freq: Every evening | ORAL | 3 refills | Status: DC | PRN

## 2020-09-02 NOTE — Progress Notes (Signed)
Analilia Rudzik Mcauliff [id number] 1956-01-05 65 y.o.  Subjective:   Patient ID:  Lorraine May is a 65 y.o. (DOB [birth year]) female.  Chief Complaint:  Chief Complaint  Patient presents with   Follow-up   Anxiety   Depression    Anxiety    Depression        Associated symptoms include fatigue and myalgias.  Past medical history includes anxiety.   Rushelle Gray Hukill presents to the office today for follow-up of Hx TRD with benefit from [facility] 2016-8 at [facility].   Verty effective.  seen June 2020.  The following changes were made: OK trial reduction lamotrigine to 1/2 tablet in the morning and 1 in PM to see if memory is better. Discussed the risk of relapse and if it occurs to increase the medication back to the full dosage. NAC N-acetylcysteine 600 mg daily.  Off label for cognitive complaints including word-finding and STM issues.  Jan 2021 appt with the following noted: No changes with reduction in lamotrigine in cognition.  Added NAC.  Asks about Cerefolin NAC again.   Forgets details in conversation. STM px.  Past history of neuropsych testing which dx MCI.  Decided not to move again and that's a good thing.  Had wondered about reducing the meds but Covid and civil unrest and politics disturbing.  Disturbing over the depth of [place] in the [place].  Telehealth is bothersome.  Concerned memory is not as good as in the past and wonders if related to meds. No sig Xanax. Patient reports stable mood and denies depressed or irritable moods.   Patient denies difficulty with sleep initiation or maintenance.  Sleep fairly well with gabapentin and naltrexone. No med changes.  08/29/19 appt with the following noted: Vaccinated. Donepezil 5 without benefit and caused diarrhea so stopped. Stopped naltrexone bc decided it didn't help neuropathy. Patient reports mild depressed and worry over Covid.  Patient denies difficulty with sleep initiation or maintenance. Denies appetite disturbance.  Patient reports  that energy and motivation have been good.  Patient denies any difficulty with concentration.  Patient denies any suicidal ideation. Wonders about restarting naltrexone at low dose by [employer].bc it did help sleep some.  Asks I RX it. Having to use Ambien now bc foot problems. Has continued [name]. Recognizes some avoidance. Plan: Wrote prescription for patient to resume low-dose naltrexone which she had previously taken per her request  03/05/2020 appointment with the following noted: Xanax prn occ. 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  It was hard.  Moved 2 mos ago. Anxiety inducing from niece with mental health problems including paranoid delusional.  Not in consistent treatment.    Not sig depressed.  Pandemic wearing. Switched lamotrigine to 150 pm and feels knocked out in the evening. Plan: switch lamotrigine to 150 mg 1/2 tab BID bc sleepiness in evening.  I would not advise further dose reduction.   Cerefolin NAC  09/02/2020 appointment with the following noted: Did not split lamotrigine but switched it to the morning and is not so sleepy. Hardly ever used Xanax. Taking zolpidem more for sleep than she had DT stressors and mind starts racing. Used to take 10 mg zolpidem.   Gabapentin for neuropathy with SE memory and wonders about duloxetine. B alcoholic and DUI again.  Causing stress on the family. Limits news DT anxiety. Patient reports stable mood and denies depressed or irritable moods.  Patient residual anxiety.  Patient denies difficulty with sleep initiation or maintenance. Denies appetite disturbance.  Patient reports that energy and motivation have been good.  Patient denies any difficulty with concentration.  Patient denies any suicidal ideation.  Past Psychiatric Medication Trials:   Nortriptyline side effects, Trintellix loss of benefit, duloxetine, mirtazapine, venlafaxine side effects, sertraline, Pristiq, Viibryd, Wellbutrin,  Depakote, carbamazepine,   lamotrigine, Seroquel, Saphris, Geodon,  TMS helped,  trazodone no response,  buspirone, gabapentin  History of gastric bypass affects the absorption of some meds.   Review of Systems:  Review of Systems  Constitutional:  Positive for fatigue.  Gastrointestinal:  Negative for diarrhea.  Musculoskeletal:  Positive for arthralgias, back pain, joint swelling and myalgias.  Neurological:  Positive for numbness. Negative for tremors and weakness.       Bruning neuropathy foot and legs  Sx worse with stress  Medications: I have reviewed the patient's current medications.  Current Outpatient Medications  Medication Sig Dispense Refill   ALPRAZolam (XANAX) 0.25 MG tablet TAKE 1 TABLET(0.25 MG) BY MOUTH TWICE DAILY AS NEEDED FOR ANXIETY 15 tablet 0   colestipol (COLESTID) 1 g tablet Take 1 g by mouth daily.   0   dicyclomine (BENTYL) 20 MG tablet Take 20 mg by mouth as needed for spasms.      gabapentin (NEURONTIN) 100 MG capsule TAKE 2 CAPSULES BY MOUTH EVERY MORNING, 2 CAPSULES AT MIDDAY AND 3 EVERY EVENING 420 capsule 0   lithium carbonate 150 MG capsule Take 1 capsule (150 mg total) by mouth daily. 90 capsule 3   Methylfol-Algae-B12-Acetylcyst (CEREFOLIN NAC) 6-90.314-2-600 MG TABS TAKE 1 TABLET BY MOUTH EVERY DAY 90 tablet 3   simvastatin (ZOCOR) 40 MG tablet TK 1 T PO IN THE EVENING  4   valACYclovir (VALTREX) 500 MG tablet Take 4 tablets by mouth. Take 4 tablets and then another 4 tablets 12 hours later as needed for fever blister  0   Vitamin D, Ergocalciferol, (DRISDOL) 50000 units CAPS capsule TK 1 C PO ONCE A WK FOR 3 WKS A MONTH  2   busPIRone (BUSPAR) 30 MG tablet Take 1 tablet (30 mg total) by mouth 2 (two) times daily. 180 tablet 1   lamoTRIgine (LAMICTAL) 150 MG tablet Take 1 tablet (150 mg total) by mouth at bedtime. 90 tablet 1   zolpidem (AMBIEN) 5 MG tablet Take 1 tablet (5 mg total) by mouth at bedtime as needed. for sleep 30 tablet 3   No current facility-administered  medications for this visit.    Medication Side Effects: None  Allergies:  Allergies  Allergen Reactions   Demeclocycline Nausea Only   Dilaudid [Hydromorphone Hcl] Itching   Fentanyl Itching   Morphine And Related Itching   Nsaids     Cannot take due to kidney disease   Percocet [Oxycodone-Acetaminophen]     itching   Tetracyclines & Related Nausea Only   Sulfa Antibiotics Rash    Past Medical History:  Diagnosis Date   Anxiety    Anxiety and depression 08/24/2011   Arthritis    Cancer ([place])    colon   Depression    Hx of colon cancer, stage II 08/24/2011   T3N0  adenoca cecum resected 04/2004  Xeloda adjuvant chemo   Hyperlipidemia 08/24/2011   Joint pain    Nausea & vomiting    Neuropathy    PONV (postoperative nausea and vomiting)    Superficial basal cell carcinoma (BCC) 09/18/2012   Right Temple    Family History  Problem Relation Age of Onset   Diabetes Mother  type 1   Stroke Mother    Dementia Mother    Thyroid disease Mother    Alcohol abuse Mother    Asthma Mother    Alcohol abuse Father    Diabetes Father    Cancer Paternal Aunt        germ cell   Cancer Maternal Grandmother        colon   Cancer Maternal Uncle        throat   Thyroid disease Sister    Alcohol abuse Brother    Psychiatric Illness Brother    Psychiatric Illness Sister    Asthma Sister    Neuropathy Neg Hx     Social History   Socioeconomic History   Marital status: Married    Spouse name: Herbie [place]   Number of children: 1   Years of education: 16   Highest education level: Not on file  Occupational History   Occupation: Press photographer- Retired  Tobacco Use   Smoking status: Never   Smokeless tobacco: Never  Vaping Use   Vaping Use: Never used  Substance and Sexual Activity   Alcohol use: Yes    Comment: occ   Drug use: No   Sexual activity: Not on file  Other Topics Concern   Not on file  Social History Narrative   Lives with husband   Caffeine use: 2-3 cups  daily   Right handed   Social Determinants of Health   Financial Resource Strain: Not on file  Food Insecurity: Not on file  Transportation Needs: Not on file  Physical Activity: Not on file  Stress: Not on file  Social Connections: Not on file  Intimate Partner Violence: Not on file    Past Medical History, Surgical history, Social history, and Family history were reviewed and updated as appropriate.   Please see review of systems for further details on the patient's review from today.   Objective:   Physical Exam:  There were no vitals taken for this visit.  Physical Exam Constitutional:      General: She is not in acute distress.    Appearance: She is well-developed.  Musculoskeletal:        General: No deformity.  Neurological:     Mental Status: She is alert and oriented to person, place, and time.     Motor: No tremor.     Coordination: Coordination normal.     Gait: Gait normal.  Psychiatric:        Attention and Perception: She does not perceive auditory hallucinations.        Mood and Affect: Mood is anxious. Mood is not depressed. Affect is not labile, blunt, angry or inappropriate.        Speech: Speech normal.        Behavior: Behavior normal.        Thought Content: Thought content normal. Thought content does not include homicidal or suicidal ideation. Thought content does not include homicidal or suicidal plan.        Cognition and Memory: She exhibits impaired recent memory.        Judgment: Judgment normal.     Comments: Insight intact. No auditory or visual hallucinations.  Somatic under stress.  But less so at this time    Lab Review:     Component Value Date/Time   NA 140 02/02/2017 1554   NA 142 08/21/2013 1445   K 5.2 02/02/2017 1554   K 4.1 08/21/2013 1445   CL 102 02/02/2017 1554  CO2 24 02/02/2017 1554   CO2 26 08/21/2013 1445   GLUCOSE 97 02/02/2017 1554   GLUCOSE 120 (H) 02/07/2014 1107   GLUCOSE 91 08/21/2013 1445   BUN 19  02/02/2017 1554   BUN 18.7 08/21/2013 1445   CREATININE 1.17 (H) 02/02/2017 1554   CREATININE 1.1 08/21/2013 1445   CALCIUM 9.3 02/02/2017 1554   CALCIUM 9.1 08/21/2013 1445   PROT 6.6 03/03/2016 1124   PROT 6.5 08/21/2013 1445   ALBUMIN 4.7 02/07/2014 1107   ALBUMIN 3.7 08/21/2013 1445   AST 21 02/07/2014 1107   AST 12 08/21/2013 1445   ALT 14 02/07/2014 1107   ALT 11 08/21/2013 1445   ALKPHOS 64 02/07/2014 1107   ALKPHOS 56 08/21/2013 1445   BILITOT 0.6 02/07/2014 1107   BILITOT 0.27 08/21/2013 1445   GFRNONAA 50 (L) 02/02/2017 1554   GFRAA 57 (L) 02/02/2017 1554       Component Value Date/Time   WBC 6.3 02/07/2014 1107   RBC 4.28 02/07/2014 1107   HGB 12.9 02/07/2014 1107   HGB 12.3 08/21/2013 1445   HCT 38.7 02/07/2014 1107   HCT 37.2 08/21/2013 1445   PLT 253 02/07/2014 1107   PLT 226 08/21/2013 1445   MCV 90.4 02/07/2014 1107   MCV 93.2 08/21/2013 1445   MCH 30.1 02/07/2014 1107   MCHC 33.3 02/07/2014 1107   RDW 13.2 02/07/2014 1107   RDW 13.8 08/21/2013 1445   LYMPHSABS 1.5 08/21/2013 1445   MONOABS 0.3 08/21/2013 1445   EOSABS 0.1 08/21/2013 1445   BASOSABS 0.0 08/21/2013 1445    No results found for: POCLITH, LITHIUM   No results found for: PHENYTOIN, PHENOBARB, VALPROATE, CBMZ   .res Assessment: Plan:    Birgit was seen today for follow-up, anxiety and depression.  Diagnoses and all orders for this visit:  Recurrent major depression in partial remission (HCC)  Generalized anxiety disorder -     busPIRone (BUSPAR) 30 MG tablet; Take 1 tablet (30 mg total) by mouth 2 (two) times daily.  PTSD (post-traumatic stress disorder) -     busPIRone (BUSPAR) 30 MG tablet; Take 1 tablet (30 mg total) by mouth 2 (two) times daily.  Insomnia due to mental condition -     zolpidem (AMBIEN) 5 MG tablet; Take 1 tablet (5 mg total) by mouth at bedtime as needed. for sleep  Mild cognitive impairment  Major depressive disorder, recurrent episode, moderate  (HCC) -     lamoTRIgine (LAMICTAL) 150 MG tablet; Take 1 tablet (150 mg total) by mouth at bedtime.   As can be seen the patient is failed a long list of psychiatric medications.  Fortunately she did respond to [place] for depression.  Cont meds that are working and tolerating well.  Maintained the TMS benefit for depression.  Marland Kitchen    Anxiety symptoms are worse with Covid and worry over neice.    She is not sure if the naltrexone has helped neuropathy.  The gabapentin just clearly does.  We discussed the potential cognitive side effects of gabapentin but she feels she needs this medicine.  She wants to continue the naltrexone off label for this purpose which was also recommended by her alternative medicine doctor. Asks about duloxetine for neuropathy.  She read about it.  Answered questions about it.  She'll discuss with neuropathy.   Continue Cerefolin-NAC which she took in the past for MCI. She feels it's helpful and wants to continue it.  Has continued telehealth with therapist Alvester Chou  PHD. And it's helpful.  Disc potential value of of lithium as a neuro protective agent with some studies showing the benefit of reducing the development of Alzheimer's disease in patients with mild cognitive impairment.  We will use ultra low-dose 150 mg daily.  She is fearful but wants to consider.  Gave copy of Dr. Narda Rutherford article.  We discussed side effects of lithium in detail.  Because of continued neurocognitive complaints with a history of diagnosis of mild cognitive impairment by neuropsych testing we rec lithium but she has stigma about it.  continue lamotrigine to 150 mg daily.  I would not advise further dose reduction.    Lamotrigine is being used to reduce risk of recurrent depression given multiple med failures as noted above. Answered questions on need to continue longterm bc high relapse risk.  Her sleep is managed but we also discussed the potential that zolpidem is interfering with her memory.   Consider alternatives.  This appt was 30 mins.  FU 6 mos.  Lynder Parents, MD, DFAPA   Please see After Visit Summary for patient specific instructions.  Future Appointments  Date Time Provider [place]  09/24/2020  1:40 PM [facility] 3 [facility]  09/29/2020  2:00 PM Melvenia Beam, MD [facility] None    No orders of the defined types were placed in this encounter.     -------------------------------

## 2020-09-09 DIAGNOSIS — F4310 Post-traumatic stress disorder, unspecified: Secondary | ICD-10-CM | POA: Diagnosis not present

## 2020-09-09 DIAGNOSIS — F411 Generalized anxiety disorder: Secondary | ICD-10-CM | POA: Diagnosis not present

## 2020-09-09 DIAGNOSIS — M25561 Pain in right knee: Secondary | ICD-10-CM | POA: Diagnosis not present

## 2020-09-16 DIAGNOSIS — M25561 Pain in right knee: Secondary | ICD-10-CM | POA: Diagnosis not present

## 2020-09-22 DIAGNOSIS — F4310 Post-traumatic stress disorder, unspecified: Secondary | ICD-10-CM | POA: Diagnosis not present

## 2020-09-22 DIAGNOSIS — F411 Generalized anxiety disorder: Secondary | ICD-10-CM | POA: Diagnosis not present

## 2020-09-23 DIAGNOSIS — M25561 Pain in right knee: Secondary | ICD-10-CM | POA: Diagnosis not present

## 2020-09-24 ENCOUNTER — Ambulatory Visit
Admission: RE | Admit: 2020-09-24 | Discharge: 2020-09-24 | Disposition: A | Discharge status: Home / Self Care | Payer: BC Managed Care – PPO | Source: Ambulatory Visit | Attending: Obstetrics and Gynecology | Admitting: Obstetrics and Gynecology

## 2020-09-24 ENCOUNTER — Other Ambulatory Visit: Payer: Self-pay

## 2020-09-24 DIAGNOSIS — Z1231 Encounter for screening mammogram for malignant neoplasm of breast: Secondary | ICD-10-CM | POA: Diagnosis not present

## 2020-09-29 ENCOUNTER — Ambulatory Visit (INDEPENDENT_AMBULATORY_CARE_PROVIDER_SITE_OTHER): Payer: BC Managed Care – PPO | Admitting: Neurology

## 2020-09-29 ENCOUNTER — Encounter: Payer: Self-pay | Admitting: Neurology

## 2020-09-29 VITALS — BP 119/71 | HR 78 | Ht 64.0 in | Wt 186.0 lb

## 2020-09-29 DIAGNOSIS — G629 Polyneuropathy, unspecified: Secondary | ICD-10-CM | POA: Diagnosis not present

## 2020-09-29 DIAGNOSIS — R4189 Other symptoms and signs involving cognitive functions and awareness: Secondary | ICD-10-CM

## 2020-09-29 MED ORDER — DULOXETINE HCL 30 MG PO CPEP: 30.0000 mg | ORAL_CAPSULE | Freq: Every day | ORAL | 3 refills | Status: DC

## 2020-09-29 NOTE — Patient Instructions (Addendum)
Start cymbalta at '30mg'$ . If feeling better we can decrease gabapentin and increase the cymbalta more. Mychart and we can increase to '60mg'$ . If feeling better or when feeling better we can continue to increase cymbalta and slowly decrease gabapentin by '100mg'$  at a time. We will also try to get Qutenza approved.   Brain fog: Would like to get her off of the gabapentin(see above). Suspect chronic pain, normal aging, stress and medications. We will try to get her off of the gabapentin. If still c/o cognitive deficits then recommend formal memory testing (she has a FHx in mother if dementia in 41s) and repeat MRI brain.   Will be calling for topical cream (pharm will call)  [facility]. For laser treatment in neuropathy

## 2020-09-29 NOTE — Progress Notes (Signed)
[id number]    Provider:  Dr Jaynee Eagles Referring Provider: Maurice Small, MD Primary Care Physician:  Maurice Small, MD  CC: Neuropathy, unexplained spasticity, brain fog  09/29/2020: Lorraine May is here for follow-up of biopsy proven small fiber neuropathy. Extensive testing has been unremarkable including SPEP, ANA, sedimentation rate, TSH, CMP, B12, vitamin D, hemoglobin A1c (per pcp notes and unremarkable) and here in our clinic: vitamin B1, CRP, Lyme, B6, heavy metals, rheumatoid factor,pan-anca, Sjogren's, angiotensin-converting enzyme, BMP. Will check b12 and hgba1c again.  Lorraine May had chemotherapy with a platinum-based drug. Lorraine May was tested for celiac disease in 2016 which was negative.   Here for annual follow up. Lorraine May still has neuropathic pain in her legs/feet. We last discussed trying botox but Lorraine May did not return for that. Lorraine May has burning pain in the legs. Gabapentin gives her brain fog. Lorraine May feels her cognition is poor. Lorraine May is interested in cymbalta. Topical lidocaine and topical capsaicin cream did not work. We spoke about botox. Her psychiatrist recommended cymbalta, discussed risk of seratonin syndrome. Would then recommend if we can increase cymbalta or feeling better, slowly decrease the gabapentin.  Lorraine May doesn't remember things Lorraine May did. Lorraine May has to reach for words. Lorraine May is also in chronic pain, stress, anxiety, medications. We discussed trying Qutenza, cymbalta. Brain fog, poor short term memory, Lorraine May has a hx of mother in her 36s with dementia. Lorraine May takes B vitamin complex.  In 2017 was normal for age.   Lorraine May complains of symptoms per HPI as well as the following symptoms: brain fog . Pertinent negatives and positives per HPI. All others negative   12/14/2018: vertigo resolved. Lorraine May still has neuropathic pain in the feet, Gabapentin helps but causes cognitive problems. We had a long conversation about options, we also discussed botox for neuropathy and reviewed literature,  picture online and You-tube video. We will try that with samples and if it helps we can try and get insurance to approve. Still very spastic, unclear etiology. In the past emg/ncs, imaging of entire neuroaxis, genetic testing, skin biopsy performed for small-fiber neuropathy.  08/2017: Lorraine May is having episodes of BPPV. Discussed etiology, Lorraine May went to vestibular therapy last year but not doing exercises at home. On the right side. Mostly in the morning but also at night. No nausea or vomiting. Happening daily when in bed turning. Affecting her balance. Discussed BPPV, etiology and watched a you tube video and then practiced the procedure. Discussed genetic testing risks and benefits, risk finding abnormalities and not knowing if significant, they have genetic counselors, also future impact on insurance and other factors. Lorraine May would like to go forward. Lorraine May was started on Naltrexone 4.'5mg'$  at bedtime and feels it is helping neuropathy.   Interval history 02/02/2017: Lorraine May is here for follow up of vertigo.  Lorraine May was on the massage table to the right Lorraine May had spinning. Lorraine May couldn't walk or keep balance. Lorraine May still has vertigo. Happens when Lorraine May moves her head. Lorraine May gets massages all the time. It was gentle turning forceful and not an "adjustment".   But the vertigo was acute as soon as the massage therapist manipulated her head position. Discuss concern for vertebral dissection and stroke.    Interval history 07/02/2016: Lorraine May is here for follow-up of biopsy proven small fiber neuropathy. Extensive testing has been unremarkable including SPEP, ANA, sedimentation rate, TSH, CMP, B12, vitamin D, hemoglobin A1c (per pcp notes and unremarkable) and here in our clinic: vitamin B1, CRP, Lyme, B6, heavy metals, rheumatoid factor,pan-anca, Sjogren's, angiotensin-converting  enzyme, BMP. Will check b12 and hgba1c again.  Lorraine May had chemotherapy with a platinum-based drug. Lorraine May was tested for celiac disease in 2016 which was negative.     Interval history 03/03/2016: Here for a new problem today burning in the legs. Lorraine May has new tingling in her feet. Lorraine May saw a dermatologist for the rash and he gave her some topical steroids. The rash is better. The tingling is still there. Lorraine May has swelling in her right foot. Lorraine May has incontinence. Started a few months ago. Thought it was dry skin. Burning and tingling. The whole foot feels hot both of them. No inciting events. No new medications. Nothing new. Feels swollen behind the knees. Not bad at night, not worse at night. Symptoms are worsening, Lorraine May is noticing it more. Continuous. No weakness. No change in back pain. No cramping, hands not involved. Nothing makes it better or worse, standing too long may make it worse.    Labs tested: spep, ANA, sed rate, TSH, CMP, B12, Vit D, hgba1c, urinalysis   Interval history: MRI of the brain and thoracic spine were unremarkable. A year ago Lorraine May pushed a box with her foot, pain exploded up her leg, thought Lorraine May pulled a muscle, Lorraine May was diagnosed with bursitis, Lorraine May saw orthopaedics and diagnosed with bursitis again, an injection made her whole leg go numb, Lorraine May has hip and lower back pain. Lorraine May has done physical therapy. Lorraine May [facility]. Lorraine May is stretching and strengthening. Lorraine May has lower back pain with radiation down both legs. Lorraine May has continued weakness.    HPI:  Lorraine May is a very nice 65 y.o. female here as a referral from Dr. Inda Merlin for chronic headaches. PMHx major depression, generalized anxiety, headaches, PTSD, Stage 3 kidney disease, Osteopenia, colon caner s/p right hemicolectomy 2006. Lorraine May has headaches for a year, no inciting events or head trauma but Lorraine May was trying all kinds "loads" of medications for her mood disorder. Never had headaches int he past or any history of migraines. Lorraine May has multiple types of headaches, occasionally will get ice pick in the right temporal area which occurs every several weeks and is brief, no  eye watering or nose running but very painful, no time of day preference. Lorraine May has a headache right now on the top of her head and behind the eyes, Lorraine May saw an ophthalmologist who sent her stat for temporal arteritis but labwork was negative. 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 The headache is a pressure headache, more pressure and pain, consistent and continuous over 4-5 hours and a few tylenol help, Lorraine May takes tylenol 3-4 times a week. Lorraine May had TMS therapy for depression last year and this resulted in headaches. Headaches most days of the week,Lorraine May wakes up with headaches. Lorraine May snores mildly. Lorraine May had an MRI of the brain but may have started after the MRI last April. The clonidine was started about the same time as the headaches. Lorraine May has no significant daytime somnolence and no other signs of obstructive sleep apnea. No other focal neurologic deficits or complaints. No double vision, no aphasia, no dysarthria, no weakness, no sensory changes. No bowel or bladder changes. No fever or systemic symptoms. Lorraine May also has a lot of tightness in the neck associated with headaches.   Reviewed notes, labs and imaging from outside physicians, which showed: Lorraine May brings in a CD with the images, personally reviewed. MRI of the cervical spine was largely unremarkable, at C4-C5 minimal central canal and  moderate right foraminal stenosis. At C5-C6 minimal central canal and moderate right foraminal stenosis.   Lorraine May was referred by [facility] orthopedics. Lorraine May is seeing them for neck pain. Neck Stiffness and headaches. Symptoms exacerbated by turning the head to the right and turning the head to the left. Treated with non-opioid analgesics. Lorraine May also reported low back pain. Mild neck discomfort, worse with range of motion and deep palpation. Exam significant for 10 beats of clonus in the lower extremities bilaterally, 3+ brisk deep tendon reflexes at the knees, Achilles, biceps, triceps and brachial radialis. Toes are flexor.  Negative Hoffman sign. Normal gait pattern. Slight limp when Lorraine May walks because of right-sided gluteal and lateral hip pain that Lorraine May is not ataxic and Lorraine May has no significant imbalance. Negative Romberg. MRI reviewed showed no cord signal changes. Mild multilevel degenerative disease no significant high-grade neural compression. No evidence to suggest cervical spondylitic myelopathy.   BMP unremarkable creatinine slightly elevated 1.02, CBC normal     Social History   Socioeconomic History   Marital status: Married    Spouse name: Herbie Baltimore   Number of children: 1   Years of education: 16   Highest education level: Not on file  Occupational History   Occupation: Press photographer- Retired  Tobacco Use   Smoking status: Never   Smokeless tobacco: Never  Vaping Use   Vaping Use: Never used  Substance and Sexual Activity   Alcohol use: Yes    Comment: occ   Drug use: No   Sexual activity: Not on file  Other Topics Concern   Not on file  Social History Narrative   Lives with husband   Caffeine use: 2-3 cups daily   Right handed   Social Determinants of Health   Financial Resource Strain: Not on file  Food Insecurity: Not on file  Transportation Needs: Not on file  Physical Activity: Not on file  Stress: Not on file  Social Connections: Not on file  Intimate Partner Violence: Not on file    Family History  Problem Relation Age of Onset   Diabetes Mother        type 1   Stroke Mother    Dementia Mother    Thyroid disease Mother    Alcohol abuse Mother    Asthma Mother    Alcohol abuse Father    Diabetes Father    Cancer Paternal Aunt        germ cell   Cancer Maternal Grandmother        colon   Cancer Maternal Uncle        throat   Thyroid disease Sister    Alcohol abuse Brother    Psychiatric Illness Brother    Psychiatric Illness Sister    Asthma Sister    Neuropathy Neg Hx     Past Medical History:  Diagnosis Date   Anxiety    Anxiety and depression 08/24/2011    Arthritis    Cancer ([place])    colon   Depression    Hx of colon cancer, stage II 08/24/2011   T3N0  adenoca cecum resected 04/2004  Xeloda adjuvant chemo   Hyperlipidemia 08/24/2011   Joint pain    Nausea & vomiting    Neuropathy    PONV (postoperative nausea and vomiting)    Superficial basal cell carcinoma (BCC) 09/18/2012   Right [place]    Past Surgical History:  Procedure Laterality Date   ABDOMINAL HYSTERECTOMY  2004   BREAST SURGERY  2011   reduction  CHOLECYSTECTOMY  10/05/2011   Procedure: LAPAROSCOPIC CHOLECYSTECTOMY WITH INTRAOPERATIVE CHOLANGIOGRAM;  Surgeon: Stark Klein, MD;  Location: [facility];  Service: General;  Laterality: N/A;   [place] Right 2006   HERNIA REPAIR  2007   KNEE ARTHROSCOPY  2010   NASAL SEPTUM SURGERY  1983   OTHER SURGICAL HISTORY  2016   Transcranial magnetic stimulation   PORT-A-CATH REMOVAL  2006   insertion and removal   REDUCTION MAMMAPLASTY Bilateral 2010   STRABISMUS SURGERY  1962    Current Outpatient Medications  Medication Sig Dispense Refill   ALPRAZolam (XANAX) 0.25 MG tablet TAKE 1 TABLET(0.25 MG) BY MOUTH TWICE DAILY AS NEEDED FOR ANXIETY 15 tablet 0   busPIRone (BUSPAR) 30 MG tablet Take 1 tablet (30 mg total) by mouth 2 (two) times daily. 180 tablet 1   colestipol (COLESTID) 1 g tablet Take 1 g by mouth daily.   0   dicyclomine (BENTYL) 20 MG tablet Take 20 mg by mouth as needed for spasms.      DULoxetine (CYMBALTA) 30 MG capsule Take 1 capsule (30 mg total) by mouth daily. 30 capsule 3   gabapentin (NEURONTIN) 100 MG capsule TAKE 2 CAPSULES BY MOUTH EVERY MORNING, 2 CAPSULES AT MIDDAY AND 3 EVERY EVENING 420 capsule 0   lamoTRIgine (LAMICTAL) 150 MG tablet Take 1 tablet (150 mg total) by mouth at bedtime. (Lorraine May taking differently: Take 150 mg by mouth in the morning.) 90 tablet 1   lithium carbonate 150 MG capsule Take 1 capsule (150 mg total) by mouth daily. 90 capsule 3    Methylfol-Algae-B12-Acetylcyst (CEREFOLIN NAC) 6-90.314-2-600 MG TABS TAKE 1 TABLET BY MOUTH EVERY DAY 90 tablet 3   simvastatin (ZOCOR) 40 MG tablet TK 1 T PO IN THE EVENING  4   valACYclovir (VALTREX) 500 MG tablet Take 4 tablets by mouth. Take 4 tablets and then another 4 tablets 12 hours later as needed for fever blister  0   Vitamin D, Ergocalciferol, (DRISDOL) 50000 units CAPS capsule once a week.  2   zolpidem (AMBIEN) 5 MG tablet Take 1 tablet (5 mg total) by mouth at bedtime as needed. for sleep 30 tablet 3   No current facility-administered medications for this visit.    Allergies as of 09/29/2020 - Review Complete 09/29/2020  Allergen Reaction Noted   Demeclocycline Nausea Only 08/24/2011   Dilaudid [hydromorphone hcl] Itching 08/24/2011   Fentanyl Itching 08/22/2012   Morphine and related Itching 08/24/2011   Nsaids  08/13/2016   Percocet [oxycodone-acetaminophen]  08/21/2013   Tetracyclines & related Nausea Only 08/24/2011   Sulfa antibiotics Rash 08/24/2011    Vitals: BP 119/71   Pulse 78   Ht '5\' 4"'$  (1.626 m)   Wt 186 lb (84.4 kg)   BMI 31.93 kg/m  Last Weight:  Wt Readings from Last 1 Encounters:  09/29/20 186 lb (84.4 kg)   Last Height:   Ht Readings from Last 1 Encounters:  09/29/20 '5\' 4"'$  (1.626 m)   Exam: NAD, pleasant                  Speech:    Speech is normal; fluent and spontaneous with normal comprehension.  Cognition:    The Lorraine May is oriented to person, place, and time;     recent and remote memory intact;     language fluent;    Cranial Nerves:    The pupils are equal, round, and reactive to light.Trigeminal sensation is  intact and the muscles of mastication are normal. The face is symmetric. The palate elevates in the midline. Hearing intact. Voice is normal. Shoulder shrug is normal. The tongue has normal motion without fasciculations.   Coordination:  No dysmetria  Motor Observation:    No asymmetry, no atrophy, and no involuntary  movements noted. Tone:    Normal muscle tone.     Strength:    Strength is V/V in the upper and lower limbs.      Sensation: decreased in a gradient fashion to pp and temp, intact vibration and position sense  Brisk reflexes and clonus (chronic)  MMSE - Mini Mental State Exam 09/29/2020  Orientation to time 5  Orientation to Place 5  Registration 3  Attention/ Calculation 5  Recall 3  Language- name 2 objects 2  Language- repeat 1  Language- follow 3 step command 3  Language- read & follow direction 1  Write a sentence 1  Copy design 0  Total score 29        Assessment/Plan:  Very nice 65 y.o. female here as a referral from Dr. Rolena Infante for chronic headaches and low back pain, small fiber neuropathy biopsy proven(PMhx chemotherapy). PMHx major depression, generalized anxiety, headaches, PTSD, Stage 3 kidney disease, Osteopenia, colon caner s/p right hemicolectomy 2006. Lorraine May also has some interesting physical findings including bilateral lower extremity clonus at the patellars and the ankle jerks but imaging of the brain, cervical and thoracic spine unremarkable for UMN disease may be genetic or neurodegenerative.  Small fiber neuropathy: Start cymbalta at '30mg'$ . If feeling better we can decrease gabapentin and increase the cymbalta more. Mychart and we can increase to '60mg'$ . If feeling better or when feeling better we can continue to increase cymbalta and slowly decrease gabapentin by '100mg'$  at a time. We will also try to get Qutenza approved and compounded foot cream.   Brain fog: Would like to get her off of the gabapentin(see above). Suspect chronic pain, normal aging, stress and medications. We will try to get her off of the gabapentin. If still c/o cognitive deficits then recommend formal memory testing (Lorraine May has a FHx in mother if dementia in 68s) and repeat MRI brain.    Also [facility] For [facility] treatment in neuropathy. Gave information but asked her to  look into it for more information.   Meds ordered this encounter  Medications   DULoxetine (CYMBALTA) 30 MG capsule    Sig: Take 1 capsule (30 mg total) by mouth daily.    Dispense:  30 capsule    Refill:  3     PRIOR ASSESSMENT AND PLANS:  vertigo resolved. Lorraine May still has neuropathic pain in the feet, Gabapentin helps but causes cognitive problems. We had a long conversation about options, we also discussed botox for neuropathy and reviewed literature, picture online and You-tube video. We will try that with samples and if it helps we can try and get insurance to approve. Still very spastic, unclear etiology. In the past emg/ncs, imaging of entire neuroaxis, genetic testing, skin biopsy performed for small-fiber neuropathy. Lorraine May will email [place] when Lorraine May is ready to make an appointment.   PRIOR:   Vertigo during a massage with therapist moving her head to the right, vertigo continues, need MRI of the brain and MRA of the head and neck looking for dissection and stroke. All was negative. Likely BPPV but sister with meniere's will refer to dr Ernesto Rutherford.   Spasticity and clonus with negative MRI  of the neuro axis: May be degenerative or genetic. Will refer to [facility]/neuromuscular specialist at [facility]. Follow clinically.   Lorraine May has biopsy proven small-fiber neuropathy, discussed findings and reviewed biopsy results,  most significant risk factor found so far is previous platinum-based chemotherapy. Had a long discussion about small-fiber neuropathy, causes and progression. Discussed the causes of peripheral neuropathy, the most common being diabetes which Lorraine May does not report having. About 20 million people in the [place] states have some form of peripheral neuropathy. This is a condition that develops as a result of damage to the peripheral nervous system.  There are multiple causes eluding metabolic, toxic, infectious and endocrine disorders, small vessel disease, autoimmune diseases, and others.  We have  performed extensive serum neuropathy screening as well as an EMG nerve conduction study. Will order genetic testing.    Vertigo: Performed epley maneuvers, discussed and watched video on the procedure. Lorraine May has been to vestibular therapy in the past. Will refer to Dr. Ernesto Rutherford, sister has Menier's  Sarina Ill, MD  [facility] [address] [place], [place] [address]  Phone [phone] Fax [phone]  I spent over 40 minutes of face-to-face and non-face-to-face time with Lorraine May on the  1. Small fiber neuropathy   2. Brain fog    diagnosis.  This included previsit chart review, lab review, study review, order entry, electronic health record documentation, Lorraine May education on the different diagnostic and therapeutic options, counseling and coordination of care, risks and benefits of management, compliance, or risk factor reduction

## 2020-10-01 DIAGNOSIS — M25562 Pain in left knee: Secondary | ICD-10-CM | POA: Diagnosis not present

## 2020-10-01 DIAGNOSIS — M25561 Pain in right knee: Secondary | ICD-10-CM | POA: Diagnosis not present

## 2020-10-02 DIAGNOSIS — M25561 Pain in right knee: Secondary | ICD-10-CM | POA: Diagnosis not present

## 2020-10-14 DIAGNOSIS — F411 Generalized anxiety disorder: Secondary | ICD-10-CM | POA: Diagnosis not present

## 2020-10-14 DIAGNOSIS — F4310 Post-traumatic stress disorder, unspecified: Secondary | ICD-10-CM | POA: Diagnosis not present

## 2020-10-21 DIAGNOSIS — F4310 Post-traumatic stress disorder, unspecified: Secondary | ICD-10-CM | POA: Diagnosis not present

## 2020-10-21 DIAGNOSIS — F411 Generalized anxiety disorder: Secondary | ICD-10-CM | POA: Diagnosis not present

## 2020-10-22 ENCOUNTER — Other Ambulatory Visit: Payer: Self-pay | Admitting: Neurology

## 2020-10-22 DIAGNOSIS — M25562 Pain in left knee: Secondary | ICD-10-CM | POA: Diagnosis not present

## 2020-10-22 DIAGNOSIS — M25561 Pain in right knee: Secondary | ICD-10-CM | POA: Diagnosis not present

## 2020-10-29 DIAGNOSIS — M25562 Pain in left knee: Secondary | ICD-10-CM | POA: Diagnosis not present

## 2020-10-29 DIAGNOSIS — M25561 Pain in right knee: Secondary | ICD-10-CM | POA: Diagnosis not present

## 2020-11-03 DIAGNOSIS — M25562 Pain in left knee: Secondary | ICD-10-CM | POA: Diagnosis not present

## 2020-11-03 DIAGNOSIS — M25561 Pain in right knee: Secondary | ICD-10-CM | POA: Diagnosis not present

## 2020-11-04 DIAGNOSIS — F4310 Post-traumatic stress disorder, unspecified: Secondary | ICD-10-CM | POA: Diagnosis not present

## 2020-11-04 DIAGNOSIS — F411 Generalized anxiety disorder: Secondary | ICD-10-CM | POA: Diagnosis not present

## 2020-11-05 DIAGNOSIS — M25562 Pain in left knee: Secondary | ICD-10-CM | POA: Diagnosis not present

## 2020-11-05 DIAGNOSIS — M25561 Pain in right knee: Secondary | ICD-10-CM | POA: Diagnosis not present

## 2020-11-11 DIAGNOSIS — M25562 Pain in left knee: Secondary | ICD-10-CM | POA: Diagnosis not present

## 2020-11-11 DIAGNOSIS — M25561 Pain in right knee: Secondary | ICD-10-CM | POA: Diagnosis not present

## 2020-11-18 DIAGNOSIS — F411 Generalized anxiety disorder: Secondary | ICD-10-CM | POA: Diagnosis not present

## 2020-12-02 DIAGNOSIS — F411 Generalized anxiety disorder: Secondary | ICD-10-CM | POA: Diagnosis not present

## 2020-12-16 DIAGNOSIS — F411 Generalized anxiety disorder: Secondary | ICD-10-CM | POA: Diagnosis not present

## 2020-12-30 DIAGNOSIS — F411 Generalized anxiety disorder: Secondary | ICD-10-CM | POA: Diagnosis not present

## 2021-01-10 ENCOUNTER — Other Ambulatory Visit: Payer: Self-pay | Admitting: Psychiatry

## 2021-01-10 DIAGNOSIS — F5105 Insomnia due to other mental disorder: Secondary | ICD-10-CM

## 2021-01-12 NOTE — Telephone Encounter (Signed)
Last filled 11/1 appt on 03/09/21

## 2021-01-13 DIAGNOSIS — F411 Generalized anxiety disorder: Secondary | ICD-10-CM | POA: Diagnosis not present

## 2021-01-14 DIAGNOSIS — R197 Diarrhea, unspecified: Secondary | ICD-10-CM | POA: Diagnosis not present

## 2021-01-21 DIAGNOSIS — Z01419 Encounter for gynecological examination (general) (routine) without abnormal findings: Secondary | ICD-10-CM | POA: Diagnosis not present

## 2021-01-21 DIAGNOSIS — R32 Unspecified urinary incontinence: Secondary | ICD-10-CM | POA: Diagnosis not present

## 2021-01-21 DIAGNOSIS — Z6830 Body mass index (BMI) 30.0-30.9, adult: Secondary | ICD-10-CM | POA: Diagnosis not present

## 2021-01-27 ENCOUNTER — Other Ambulatory Visit: Payer: Self-pay | Admitting: Psychiatry

## 2021-01-27 ENCOUNTER — Other Ambulatory Visit: Payer: Self-pay | Admitting: Neurology

## 2021-01-27 DIAGNOSIS — F4310 Post-traumatic stress disorder, unspecified: Secondary | ICD-10-CM

## 2021-01-27 DIAGNOSIS — F411 Generalized anxiety disorder: Secondary | ICD-10-CM

## 2021-01-28 ENCOUNTER — Other Ambulatory Visit: Payer: Self-pay | Admitting: Neurology

## 2021-01-28 NOTE — Telephone Encounter (Signed)
Last filled 03/05/20 appt 03/09/21

## 2021-02-10 DIAGNOSIS — F411 Generalized anxiety disorder: Secondary | ICD-10-CM | POA: Diagnosis not present

## 2021-02-12 ENCOUNTER — Ambulatory Visit: Payer: BC Managed Care – PPO | Admitting: Neurology

## 2021-02-17 DIAGNOSIS — M6281 Muscle weakness (generalized): Secondary | ICD-10-CM | POA: Diagnosis not present

## 2021-02-17 DIAGNOSIS — R152 Fecal urgency: Secondary | ICD-10-CM | POA: Diagnosis not present

## 2021-02-17 DIAGNOSIS — M62838 Other muscle spasm: Secondary | ICD-10-CM | POA: Diagnosis not present

## 2021-02-17 DIAGNOSIS — N3941 Urge incontinence: Secondary | ICD-10-CM | POA: Diagnosis not present

## 2021-02-24 DIAGNOSIS — F411 Generalized anxiety disorder: Secondary | ICD-10-CM | POA: Diagnosis not present

## 2021-02-27 ENCOUNTER — Encounter: Payer: Self-pay | Admitting: Neurology

## 2021-03-04 ENCOUNTER — Other Ambulatory Visit: Payer: Self-pay | Admitting: Neurology

## 2021-03-04 DIAGNOSIS — Z79899 Other long term (current) drug therapy: Secondary | ICD-10-CM | POA: Diagnosis not present

## 2021-03-04 DIAGNOSIS — Z23 Encounter for immunization: Secondary | ICD-10-CM | POA: Diagnosis not present

## 2021-03-04 DIAGNOSIS — E538 Deficiency of other specified B group vitamins: Secondary | ICD-10-CM | POA: Diagnosis not present

## 2021-03-04 DIAGNOSIS — Z0000 Encounter for general adult medical examination without abnormal findings: Secondary | ICD-10-CM | POA: Diagnosis not present

## 2021-03-04 DIAGNOSIS — E7800 Pure hypercholesterolemia, unspecified: Secondary | ICD-10-CM | POA: Diagnosis not present

## 2021-03-04 DIAGNOSIS — E559 Vitamin D deficiency, unspecified: Secondary | ICD-10-CM | POA: Diagnosis not present

## 2021-03-04 MED ORDER — DULOXETINE HCL 60 MG PO CPEP: 60.0000 mg | ORAL_CAPSULE | Freq: Every day | ORAL | 3 refills | Status: DC

## 2021-03-09 ENCOUNTER — Encounter: Payer: Self-pay | Admitting: Psychiatry

## 2021-03-09 ENCOUNTER — Other Ambulatory Visit: Payer: Self-pay

## 2021-03-09 ENCOUNTER — Ambulatory Visit (INDEPENDENT_AMBULATORY_CARE_PROVIDER_SITE_OTHER): Payer: BC Managed Care – PPO | Admitting: Psychiatry

## 2021-03-09 DIAGNOSIS — F3341 Major depressive disorder, recurrent, in partial remission: Secondary | ICD-10-CM

## 2021-03-09 DIAGNOSIS — F411 Generalized anxiety disorder: Secondary | ICD-10-CM

## 2021-03-09 DIAGNOSIS — F4310 Post-traumatic stress disorder, unspecified: Secondary | ICD-10-CM | POA: Diagnosis not present

## 2021-03-09 DIAGNOSIS — F5105 Insomnia due to other mental disorder: Secondary | ICD-10-CM

## 2021-03-09 DIAGNOSIS — G3184 Mild cognitive impairment, so stated: Secondary | ICD-10-CM

## 2021-03-09 NOTE — Progress Notes (Signed)
Lorraine May [id number] [birth year] 66 y.o.  Subjective:   Patient ID:  Lorraine May is a 66 y.o. (DOB September 20, 1955) female.  Chief Complaint:  Chief Complaint  Patient presents with   Follow-up   Depression   Anxiety    Anxiety Patient reports no palpitations.    Depression        Associated symptoms include fatigue and myalgias.  Past medical history includes anxiety.   Lorraine May presents to the office today for follow-up of Hx TRD with benefit from [place] 2016-8 at [facility].   Verty effective.  seen June 2020.  The following changes were made: OK trial reduction lamotrigine to 1/2 tablet in the morning and 1 in PM to see if memory is better. Discussed the risk of relapse and if it occurs to increase the medication back to the full dosage. NAC N-acetylcysteine 600 mg daily.  Off label for cognitive complaints including word-finding and STM issues.  Jan 2021 appt with the following noted: No changes with reduction in lamotrigine in cognition.  Added NAC.  Asks about Cerefolin NAC again.   Forgets details in conversation. STM px.  Past history of neuropsych testing which dx MCI.  Decided not to move again and that's a good thing.  Had wondered about reducing the meds but Covid and civil unrest and politics disturbing.  Disturbing over the depth of hatred in the country.  Telehealth is bothersome.  Concerned memory is not as good as in the past and wonders if related to meds. No sig Xanax. Patient reports stable mood and denies depressed or irritable moods.   Patient denies difficulty with sleep initiation or maintenance.  Sleep fairly well with gabapentin and naltrexone. No med changes.  08/29/19 appt with the following noted: Vaccinated. Donepezil 5 without benefit and caused diarrhea so stopped. Stopped naltrexone bc decided it didn't help neuropathy. Patient reports mild depressed and worry over Covid.  Patient denies difficulty with sleep initiation or maintenance. Denies  appetite disturbance.  Patient reports that energy and motivation have been good.  Patient denies any difficulty with concentration.  Patient denies any suicidal ideation. Wonders about restarting naltrexone at low dose by [employer].bc it did help sleep some.  Asks I RX it. Having to use Ambien now bc foot problems. Has continued Lorraine May. Recognizes some avoidance. Plan: Wrote prescription for patient to resume low-dose naltrexone which she had previously taken per her request  03/05/2020 appointment with the following noted: Xanax prn occ. 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  It was hard.  Moved 2 mos ago. Anxiety inducing from niece with mental health problems including paranoid delusional.  Not in consistent treatment.    Not sig depressed.  Pandemic wearing. Switched lamotrigine to 150 pm and feels knocked out in the evening. Plan: switch lamotrigine to 150 mg 1/2 tab BID bc sleepiness in evening.  I would not advise further dose reduction.   Cerefolin NAC  09/02/2020 appointment with the following noted: Did not split lamotrigine but switched it to the morning and is not so sleepy. Hardly ever used Xanax. Taking zolpidem more for sleep than she had DT stressors and mind starts racing. Used to take 10 mg zolpidem.   Gabapentin for neuropathy with SE memory and wonders about duloxetine. B alcoholic and DUI again.  Causing stress on the family. Limits news DT anxiety. Patient reports stable mood and denies depressed or irritable moods.  Patient residual anxiety.  Patient denies difficulty with sleep initiation or  maintenance. Denies appetite disturbance.  Patient reports that energy and motivation have been good.  Patient denies any difficulty with concentration.  Patient denies any suicidal ideation.  03/09/2021 appt noted: Neuro Dr. Lavell May started duloxetine and sees benefit.  Feet aren't burning now.  Tingling better and  neuropathy much better.  Increased to duloxetine 60 mg  tomorrow. Able to wean off the gabapentin. Feels better about meds. H's parent's needing care and stressful.  H stressed.  Her parents deceased.   Not markedly depressed.  Some anxiety over it. Taking Ambien again for sleep.   Just bought a place at [facility].  Past Psychiatric Medication Trials:   Nortriptyline side effects, Trintellix loss of benefit,  duloxetine 60 helped neuropathy,  mirtazapine, venlafaxine side effects, sertraline, Pristiq, Viibryd, Wellbutrin,  Depakote, carbamazepine,  lamotrigine, Seroquel, Saphris, Geodon,  TMS helped,  trazodone no response,  buspirone, gabapentin  History of gastric bypass affects the absorption of some meds.   Review of Systems:  Review of Systems  Constitutional:  Positive for fatigue.  Cardiovascular:  Negative for palpitations.  Gastrointestinal:  Negative for diarrhea.  Musculoskeletal:  Positive for arthralgias, back pain, joint swelling and myalgias.  Neurological:  Positive for numbness. Negative for tremors and weakness.       Bruning neuropathy foot and legs  Sx worse with stress  Medications: I have reviewed the patient's current medications.  Current Outpatient Medications  Medication Sig Dispense Refill   ALPRAZolam (XANAX) 0.25 MG tablet TAKE 1 TABLET(0.25 MG) BY MOUTH TWICE DAILY AS NEEDED FOR ANXIETY 15 tablet 0   busPIRone (BUSPAR) 30 MG tablet Take 1 tablet (30 mg total) by mouth 2 (two) times daily. 180 tablet 1   colestipol (COLESTID) 1 g tablet Take 1 g by mouth daily.   0   dicyclomine (BENTYL) 20 MG tablet Take 20 mg by mouth as needed for spasms.      DULoxetine (CYMBALTA) 60 MG capsule Take 1 capsule (60 mg total) by mouth daily. (Patient taking differently: Take 60 mg by mouth daily. Starts tommorow) 90 capsule 3   simvastatin (ZOCOR) 40 MG tablet TK 1 T PO IN THE EVENING  4   Vitamin D, Ergocalciferol, (DRISDOL) 50000 units CAPS capsule once a week.  2   zolpidem (AMBIEN) 5 MG tablet TAKE 1 TABLET(5  MG) BY MOUTH AT BEDTIME AS NEEDED FOR SLEEP 30 tablet 1   gabapentin (NEURONTIN) 100 MG capsule TAKE 2 CAPSULES BY MOUTH EVERY MORNING AT NOON THEN TAKE 3 CAPSULES BY MOUTH EVERY EVENING (Patient taking differently: 200 mg daily ,weaning off) 210 capsule 0   lamoTRIgine (LAMICTAL) 150 MG tablet Take 1 tablet (150 mg total) by mouth at bedtime. (Patient taking differently: Take 150 mg by mouth in the morning.) 90 tablet 1   Methylfol-Algae-B12-Acetylcyst (CEREFOLIN NAC) 6-90.314-2-600 MG TABS TAKE 1 TABLET BY MOUTH EVERY DAY 90 tablet 3   No current facility-administered medications for this visit.    Medication Side Effects: None  Allergies:  Allergies  Allergen Reactions   Demeclocycline Nausea Only   Dilaudid [Hydromorphone Hcl] Itching   Fentanyl Itching   Morphine And Related Itching   Nsaids     Cannot take due to kidney disease   Percocet [Oxycodone-Acetaminophen]     itching   Tetracyclines & Related Nausea Only   Sulfa Antibiotics Rash    Past Medical History:  Diagnosis Date   Anxiety    Anxiety and depression 08/24/2011   Arthritis    Cancer ([place])  colon   Depression    Hx of colon cancer, stage II 08/24/2011   T3N0  adenoca cecum resected 04/2004  Xeloda adjuvant chemo   Hyperlipidemia 08/24/2011   Joint pain    Nausea & vomiting    Neuropathy    PONV (postoperative nausea and vomiting)    Superficial basal cell carcinoma (BCC) 09/18/2012   Right Temple    Family History  Problem Relation Age of Onset   Diabetes Mother        type 1   Stroke Mother    Dementia Mother    Thyroid disease Mother    Alcohol abuse Mother    Asthma Mother    Alcohol abuse Father    Diabetes Father    Cancer Paternal Aunt        germ cell   Cancer Maternal Grandmother        colon   Cancer Maternal Uncle        throat   Thyroid disease Sister    Alcohol abuse Brother    Psychiatric Illness Brother    Psychiatric Illness Sister    Asthma Sister    Neuropathy Neg Hx      Social History   Socioeconomic History   Marital status: Married    Spouse name: Herbie [place]   Number of children: 1   Years of education: 16   Highest education level: Not on file  Occupational History   Occupation: Press photographer- Retired  Tobacco Use   Smoking status: Never   Smokeless tobacco: Never  Vaping Use   Vaping Use: Never used  Substance and Sexual Activity   Alcohol use: Yes    Comment: occ   Drug use: No   Sexual activity: Not on file  Other Topics Concern   Not on file  Social History Narrative   Lives with husband   Caffeine use: 2-3 cups daily   Right handed   Social Determinants of Health   Financial Resource Strain: Not on file  Food Insecurity: Not on file  Transportation Needs: Not on file  Physical Activity: Not on file  Stress: Not on file  Social Connections: Not on file  Intimate Partner Violence: Not on file    Past Medical History, Surgical history, Social history, and Family history were reviewed and updated as appropriate.   Please see review of systems for further details on the patient's review from today.   Objective:   Physical Exam:  There were no vitals taken for this visit.  Physical Exam Constitutional:      General: She is not in acute distress.    Appearance: She is well-developed.  Musculoskeletal:        General: No deformity.  Neurological:     Mental Status: She is alert and oriented to person, place, and time.     Motor: No tremor.     Coordination: Coordination normal.     Gait: Gait normal.  Psychiatric:        Attention and Perception: She does not perceive auditory hallucinations.        Mood and Affect: Mood is anxious. Mood is not depressed. Affect is not labile, blunt, angry or inappropriate.        Speech: Speech normal.        Behavior: Behavior normal.        Thought Content: Thought content normal. Thought content is not delusional. Thought content does not include homicidal or suicidal ideation.  Thought content does not include suicidal  plan.        Cognition and Memory: She exhibits impaired recent memory.        Judgment: Judgment normal.     Comments: Insight intact. No auditory or visual hallucinations.  Somatic under stress.  But less so at this time    Lab Review:     Component Value Date/Time   NA 140 02/02/2017 1554   NA 142 08/21/2013 1445   K 5.2 02/02/2017 1554   K 4.1 08/21/2013 1445   CL 102 02/02/2017 1554   CO2 24 02/02/2017 1554   CO2 26 08/21/2013 1445   GLUCOSE 97 02/02/2017 1554   GLUCOSE 120 (H) 02/07/2014 1107   GLUCOSE 91 08/21/2013 1445   BUN 19 02/02/2017 1554   BUN 18.7 08/21/2013 1445   CREATININE 1.17 (H) 02/02/2017 1554   CREATININE 1.1 08/21/2013 1445   CALCIUM 9.3 02/02/2017 1554   CALCIUM 9.1 08/21/2013 1445   PROT 6.6 03/03/2016 1124   PROT 6.5 08/21/2013 1445   ALBUMIN 4.7 02/07/2014 1107   ALBUMIN 3.7 08/21/2013 1445   AST 21 02/07/2014 1107   AST 12 08/21/2013 1445   ALT 14 02/07/2014 1107   ALT 11 08/21/2013 1445   ALKPHOS 64 02/07/2014 1107   ALKPHOS 56 08/21/2013 1445   BILITOT 0.6 02/07/2014 1107   BILITOT 0.27 08/21/2013 1445   GFRNONAA 50 (L) 02/02/2017 1554   GFRAA 57 (L) 02/02/2017 1554       Component Value Date/Time   WBC 6.3 02/07/2014 1107   RBC 4.28 02/07/2014 1107   HGB 12.9 02/07/2014 1107   HGB 12.3 08/21/2013 1445   HCT 38.7 02/07/2014 1107   HCT 37.2 08/21/2013 1445   PLT 253 02/07/2014 1107   PLT 226 08/21/2013 1445   MCV 90.4 02/07/2014 1107   MCV 93.2 08/21/2013 1445   MCH 30.1 02/07/2014 1107   MCHC 33.3 02/07/2014 1107   RDW 13.2 02/07/2014 1107   RDW 13.8 08/21/2013 1445   LYMPHSABS 1.5 08/21/2013 1445   MONOABS 0.3 08/21/2013 1445   EOSABS 0.1 08/21/2013 1445   BASOSABS 0.0 08/21/2013 1445    No results found for: POCLITH, LITHIUM   No results found for: PHENYTOIN, PHENOBARB, VALPROATE, CBMZ   .res Assessment: Plan:    Rease was seen today for follow-up, depression and  anxiety.  Diagnoses and all orders for this visit:  Recurrent major depression in partial remission ([place])  Generalized anxiety disorder  PTSD (post-traumatic stress disorder)  Insomnia due to mental condition  Mild cognitive impairment    As can be seen the patient is failed a long list of psychiatric medications.  Fortunately she did respond to [place] for depression.  Cont meds that are working and tolerating well.  Maintained the TMS benefit for depression.  Marland Kitchen    Anxiety symptoms are worse with Covid and worry over neice.    She is not sure if the naltrexone has helped neuropathy.   Asks about duloxetine for neuropathy.  She read about it.  Answered questions about it.  Benefit of duloxetine for neuropathy discussed in detail. Agree with duloxetine 60 mg daily and disc SE in answer to questions.  Mood is stable.  Continue Cerefolin-NAC which she took in the past for MCI. She feels it's helpful and wants to continue it.  Has continued telehealth with therapist Lorraine May. And it's helpful.  Because of continued neurocognitive complaints with a history of diagnosis of mild cognitive impairment by neuropsych testing we rec lithium  but she has stigma about it.  continue lamotrigine to 150 mg daily.  I would not advise further dose reduction.    Lamotrigine is being used to reduce risk of recurrent depression given multiple med failures as noted above. Answered questions on need to continue longterm bc high relapse risk.  Her sleep is managed but we also discussed the potential that zolpidem is interfering with her memory.  Consider alternatives.  Disc difference with generics. OK zolpidem 2.5-5 mg HS.  No SE  This appt was 30 mins.  FU 6 mos.  Lynder Parents, MD, DFAPA   Please see After Visit Summary for patient specific instructions.  Future Appointments  Date Time Provider [place]  04/01/2021  9:30 AM Melvenia Beam, MD [facility] None    No orders of the  defined types were placed in this encounter.      -------------------------------

## 2021-03-10 DIAGNOSIS — N3941 Urge incontinence: Secondary | ICD-10-CM | POA: Diagnosis not present

## 2021-03-10 DIAGNOSIS — R152 Fecal urgency: Secondary | ICD-10-CM | POA: Diagnosis not present

## 2021-03-10 DIAGNOSIS — M6281 Muscle weakness (generalized): Secondary | ICD-10-CM | POA: Diagnosis not present

## 2021-03-10 DIAGNOSIS — M62838 Other muscle spasm: Secondary | ICD-10-CM | POA: Diagnosis not present

## 2021-03-10 DIAGNOSIS — F411 Generalized anxiety disorder: Secondary | ICD-10-CM | POA: Diagnosis not present

## 2021-03-19 ENCOUNTER — Other Ambulatory Visit: Payer: Self-pay | Admitting: Psychiatry

## 2021-03-19 DIAGNOSIS — F5105 Insomnia due to other mental disorder: Secondary | ICD-10-CM

## 2021-03-24 DIAGNOSIS — R152 Fecal urgency: Secondary | ICD-10-CM | POA: Diagnosis not present

## 2021-03-24 DIAGNOSIS — N3941 Urge incontinence: Secondary | ICD-10-CM | POA: Diagnosis not present

## 2021-03-24 DIAGNOSIS — M62838 Other muscle spasm: Secondary | ICD-10-CM | POA: Diagnosis not present

## 2021-03-24 DIAGNOSIS — F411 Generalized anxiety disorder: Secondary | ICD-10-CM | POA: Diagnosis not present

## 2021-03-24 DIAGNOSIS — M6281 Muscle weakness (generalized): Secondary | ICD-10-CM | POA: Diagnosis not present

## 2021-03-31 DIAGNOSIS — N3941 Urge incontinence: Secondary | ICD-10-CM | POA: Diagnosis not present

## 2021-03-31 DIAGNOSIS — R152 Fecal urgency: Secondary | ICD-10-CM | POA: Diagnosis not present

## 2021-03-31 DIAGNOSIS — M62838 Other muscle spasm: Secondary | ICD-10-CM | POA: Diagnosis not present

## 2021-03-31 DIAGNOSIS — M6281 Muscle weakness (generalized): Secondary | ICD-10-CM | POA: Diagnosis not present

## 2021-04-01 ENCOUNTER — Ambulatory Visit (INDEPENDENT_AMBULATORY_CARE_PROVIDER_SITE_OTHER): Payer: BC Managed Care – PPO | Admitting: Neurology

## 2021-04-01 ENCOUNTER — Other Ambulatory Visit: Payer: Self-pay

## 2021-04-01 ENCOUNTER — Encounter: Payer: Self-pay | Admitting: Neurology

## 2021-04-01 VITALS — BP 109/66 | HR 79 | Ht 64.0 in | Wt 175.0 lb

## 2021-04-01 DIAGNOSIS — R42 Dizziness and giddiness: Secondary | ICD-10-CM

## 2021-04-01 DIAGNOSIS — R4189 Other symptoms and signs involving cognitive functions and awareness: Secondary | ICD-10-CM | POA: Diagnosis not present

## 2021-04-01 DIAGNOSIS — M791 Myalgia, unspecified site: Secondary | ICD-10-CM | POA: Diagnosis not present

## 2021-04-01 DIAGNOSIS — G620 Drug-induced polyneuropathy: Secondary | ICD-10-CM | POA: Diagnosis not present

## 2021-04-01 DIAGNOSIS — G629 Polyneuropathy, unspecified: Secondary | ICD-10-CM

## 2021-04-01 DIAGNOSIS — R519 Headache, unspecified: Secondary | ICD-10-CM

## 2021-04-01 MED ORDER — DULOXETINE HCL 60 MG PO CPEP: 60.0000 mg | ORAL_CAPSULE | Freq: Every day | ORAL | 4 refills | Status: DC

## 2021-04-01 NOTE — Progress Notes (Signed)
[facility]    Provider:  Dr Jaynee Eagles Referring Provider: Maurice Small, MD Primary Care Physician:  Maurice Small, MD  CC: Neuropathy, unexplained spasticity, brain fog  04/01/2021: She loves the cymbalta. She stopped thegabapentin and brain fog is improved. Tremor is improved. Pain in the feet is improved. We can always go up to 60mg  bid but now she is doing fantastic on 60mg  daily. She looks fantastic. She has some muscle tenderness in the upper and lower legs. She is on a statin. Been ongoing for years but maybe since she has improved with the other pain she is noticing it more. Could also stop the statin for a month and see if improved.  No headaches.   Patient complains of symptoms per HPI as well as the following symptoms: imbalance . Pertinent negatives and positives per HPI. All others negative   09/29/2020: Patient is here for follow-up of biopsy proven small fiber neuropathy. Extensive testing has been unremarkable including SPEP, ANA, sedimentation rate, TSH, CMP, B12, vitamin D, hemoglobin A1c (per pcp notes and unremarkable) and here in our clinic: vitamin B1, CRP, Lyme, B6, heavy metals, rheumatoid factor,pan-anca, Sjogren's, angiotensin-converting enzyme, BMP. Will check b12 and hgba1c again.  She had chemotherapy with a platinum-based drug. She was tested for celiac disease in 2016 which was negative.   Here for annual follow up. She still has neuropathic pain in her legs/feet. We last discussed trying botox but she did not return for that. She has burning pain in the legs. Gabapentin gives her brain fog. She feels her cognition is poor. She is interested in cymbalta. Topical lidocaine and topical capsaicin cream did not work. We spoke about botox. Her psychiatrist recommended cymbalta, discussed risk of seratonin syndrome. Would then recommend if we can increase cymbalta or feeling better, slowly decrease the gabapentin.  She doesn't remember things she did. She has to  reach for words. She is also in chronic pain, stress, anxiety, medications. We discussed trying Qutenza, cymbalta. Brain fog, poor short term memory, she has a hx of mother in her 71s with dementia. She takes B vitamin complex.  In 2017 was normal for age.   Patient complains of symptoms per HPI as well as the following symptoms: brain fog . Pertinent negatives and positives per HPI. All others negative   12/14/2018: vertigo resolved. She still has neuropathic pain in the feet, Gabapentin helps but causes cognitive problems. We had a long conversation about options, we also discussed botox for neuropathy and reviewed literature, picture online and You-tube video. We will try that with samples and if it helps we can try and get insurance to approve. Still very spastic, unclear etiology. In the past emg/ncs, imaging of entire neuroaxis, genetic testing, skin biopsy performed for small-fiber neuropathy.  08/2017: She is having episodes of BPPV. Discussed etiology, she went to vestibular therapy last year but not doing exercises at home. On the right side. Mostly in the morning but also at night. No nausea or vomiting. Happening daily when in bed turning. Affecting her balance. Discussed BPPV, etiology and watched a you tube video and then practiced the procedure. Discussed genetic testing risks and benefits, risk finding abnormalities and not knowing if significant, they have genetic counselors, also future impact on insurance and other factors. She would like to go forward. She was started on Naltrexone 4.5mg  at bedtime and feels it is helping neuropathy.   Interval history 02/02/2017: Patient is here for follow up of vertigo.  She was on  the massage table to the right she had spinning. She couldn't walk or keep balance. She still has vertigo. Happens when she moves her head. She gets massages all the time. It was gentle turning forceful and not an "adjustment".   But the vertigo was acute as soon as the massage  therapist manipulated her head position. Discuss concern for vertebral dissection and stroke.    Interval history 07/02/2016: Patient is here for follow-up of biopsy proven small fiber neuropathy. Extensive testing has been unremarkable including SPEP, ANA, sedimentation rate, TSH, CMP, B12, vitamin D, hemoglobin A1c (per pcp notes and unremarkable) and here in our clinic: vitamin B1, CRP, Lyme, B6, heavy metals, rheumatoid factor,pan-anca, Sjogren's, angiotensin-converting enzyme, BMP. Will check b12 and hgba1c again.  She had chemotherapy with a platinum-based drug. She was tested for celiac disease in 2016 which was negative.    Interval history 03/03/2016: Here for a new problem today burning in the legs. She has new tingling in her feet. She saw a dermatologist for the rash and he gave her some topical steroids. The rash is better. The tingling is still there. She has swelling in her right foot. She has incontinence. Started a few months ago. Thought it was dry skin. Burning and tingling. The whole foot feels hot both of them. No inciting events. No new medications. Nothing new. Feels swollen behind the knees. Not bad at night, not worse at night. Symptoms are worsening, she is noticing it more. Continuous. No weakness. No change in back pain. No cramping, hands not involved. Nothing makes it better or worse, standing too long may make it worse.    Labs tested: spep, ANA, sed rate, TSH, CMP, B12, Vit D, hgba1c, urinalysis   Interval history: MRI of the brain and thoracic spine were unremarkable. A year ago she pushed a box with her foot, pain exploded up her leg, thought she pulled a muscle, she was diagnosed with bursitis, She saw orthopaedics and diagnosed with bursitis again, an injection made her whole leg go numb, She has hip and lower back pain. She has done physical therapy. [facility]. She is stretching and strengthening. She has lower back pain with  radiation down both legs. She has continued weakness.    HPI:  Lorraine May is a very nice 66 y.o. female here as a referral from Dr. Inda Merlin for chronic headaches. PMHx major depression, generalized anxiety, headaches, PTSD, Stage 3 kidney disease, Osteopenia, colon caner s/p right hemicolectomy 2006. She has headaches for a year, no inciting events or head trauma but she was trying all kinds "loads" of medications for her mood disorder. Never had headaches int he past or any history of migraines. She has multiple types of headaches, occasionally will get ice pick in the right temporal area which occurs every several weeks and is brief, no eye watering or nose running but very painful, no time of day preference. She has a headache right now on the top of her head and behind the eyes, she saw an ophthalmologist who sent her stat for temporal arteritis but labwork was negative. 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 The headache is a pressure headache, more pressure and pain, consistent and continuous over 4-5 hours and a few tylenol help, she takes tylenol 3-4 times a week. She had TMS therapy for depression last year and this resulted in headaches. Headaches most days of the week,she wakes up with headaches. She snores mildly.  She had an MRI of the brain but may have started after the MRI last April. The clonidine was started about the same time as the headaches. She has no significant daytime somnolence and no other signs of obstructive sleep apnea. No other focal neurologic deficits or complaints. No double vision, no aphasia, no dysarthria, no weakness, no sensory changes. No bowel or bladder changes. No fever or systemic symptoms. She also has a lot of tightness in the neck associated with headaches.   Reviewed notes, labs and imaging from outside physicians, which showed: Patient brings in a CD with the images, personally reviewed. MRI of the cervical spine was largely unremarkable, at C4-C5 minimal central  canal and moderate right foraminal stenosis. At C5-C6 minimal central canal and moderate right foraminal stenosis.   Patient was referred by [facility] orthopedics. She is seeing them for neck pain. Neck Stiffness and headaches. Symptoms exacerbated by turning the head to the right and turning the head to the left. Treated with non-opioid analgesics. Patient also reported low back pain. Mild neck discomfort, worse with range of motion and deep palpation. Exam significant for 10 beats of clonus in the lower extremities bilaterally, 3+ brisk deep tendon reflexes at the knees, Achilles, biceps, triceps and brachial radialis. Toes are flexor. Negative Hoffman sign. Normal gait pattern. Slight limp when she walks because of right-sided gluteal and lateral hip pain that she is not ataxic and she has no significant imbalance. Negative Romberg. MRI reviewed showed no cord signal changes. Mild multilevel degenerative disease no significant high-grade neural compression. No evidence to suggest cervical spondylitic myelopathy.   BMP unremarkable creatinine slightly elevated 1.02, CBC normal     Social History   Socioeconomic History   Marital status: Married    Spouse name: Herbie Baltimore   Number of children: 1   Years of education: 16   Highest education level: Not on file  Occupational History   Occupation: Press photographer- Retired  Tobacco Use   Smoking status: Never   Smokeless tobacco: Never  Vaping Use   Vaping Use: Never used  Substance and Sexual Activity   Alcohol use: Yes    Comment: [place] once a week   Drug use: No   Sexual activity: Not on file  Other Topics Concern   Not on file  Social History Narrative   Lives with husband   Caffeine use: 2-3 cups daily   Right handed   Social Determinants of Health   Financial Resource Strain: Not on file  Food Insecurity: Not on file  Transportation Needs: Not on file  Physical Activity: Not on file  Stress: Not on file  Social Connections: Not  on file  Intimate Partner Violence: Not on file    Family History  Problem Relation Age of Onset   Diabetes Mother        type 1   Stroke Mother    Dementia Mother    Thyroid disease Mother    Alcohol abuse Mother    Asthma Mother    Neuropathy Mother    Alcohol abuse Father    Diabetes Father    Thyroid disease Sister    Psychiatric Illness Sister    Asthma Sister    Alcohol abuse Brother    Psychiatric Illness Brother    Cancer Maternal Uncle        throat   Cancer Paternal Aunt        germ cell   Cancer Maternal Grandmother  colon    Past Medical History:  Diagnosis Date   Anxiety    Anxiety and depression 08/24/2011   Arthritis    Cancer ([place])    colon   Depression    Hx of colon cancer, stage II 08/24/2011   T3N0  adenoca cecum resected 04/2004  Xeloda adjuvant chemo   Hyperlipidemia 08/24/2011   Joint pain    Nausea & vomiting    Neuropathy    PONV (postoperative nausea and vomiting)    Superficial basal cell carcinoma (BCC) 09/18/2012   Right [place]    Past Surgical History:  Procedure Laterality Date   ABDOMINAL HYSTERECTOMY  2004   BREAST SURGERY  2011   reduction   CHOLECYSTECTOMY  10/05/2011   Procedure: LAPAROSCOPIC CHOLECYSTECTOMY WITH INTRAOPERATIVE CHOLANGIOGRAM;  Surgeon: Stark Klein, MD;  Location: [place] [facility];  Service: General;  Laterality: N/A;   [place] Right 2006   HERNIA REPAIR  2007   KNEE ARTHROSCOPY  2010   NASAL SEPTUM SURGERY  1983   OTHER SURGICAL HISTORY  2016   Transcranial magnetic stimulation   PORT-A-CATH REMOVAL  2006   insertion and removal   REDUCTION MAMMAPLASTY Bilateral 2010   STRABISMUS SURGERY  1962    Current Outpatient Medications  Medication Sig Dispense Refill   ALPRAZolam (XANAX) 0.25 MG tablet TAKE 1 TABLET(0.25 MG) BY MOUTH TWICE DAILY AS NEEDED FOR ANXIETY 15 tablet 0   busPIRone (BUSPAR) 30 MG tablet Take 1 tablet (30 mg total) by mouth 2 (two) times daily. 180  tablet 1   colestipol (COLESTID) 1 g tablet Take 1 g by mouth daily.   0   dicyclomine (BENTYL) 20 MG tablet Take 20 mg by mouth as needed for spasms.      lamoTRIgine (LAMICTAL) 150 MG tablet Take 1 tablet (150 mg total) by mouth at bedtime. (Patient taking differently: Take 150 mg by mouth in the morning.) 90 tablet 1   simvastatin (ZOCOR) 40 MG tablet TK 1 T PO IN THE EVENING  4   zolpidem (AMBIEN) 5 MG tablet TAKE 1 TABLET(5 MG) BY MOUTH AT BEDTIME AS NEEDED FOR SLEEP 30 tablet 5   DULoxetine (CYMBALTA) 60 MG capsule Take 1 capsule (60 mg total) by mouth daily. 90 capsule 4   No current facility-administered medications for this visit.    Allergies as of 04/01/2021 - Review Complete 04/01/2021  Allergen Reaction Noted   Demeclocycline Nausea Only 08/24/2011   Dilaudid [hydromorphone hcl] Itching 08/24/2011   Fentanyl Itching 08/22/2012   Morphine and related Itching 08/24/2011   Nsaids  08/13/2016   Percocet [oxycodone-acetaminophen]  08/21/2013   Tetracyclines & related Nausea Only 08/24/2011   Sulfa antibiotics Rash 08/24/2011    Vitals: BP 109/66    Pulse 79    Ht 5\' 4"  (1.626 m)    Wt 175 lb (79.4 kg)    BMI 30.04 kg/m  Last Weight:  Wt Readings from Last 1 Encounters:  04/01/21 175 lb (79.4 kg)   Last Height:   Ht Readings from Last 1 Encounters:  04/01/21 5\' 4"  (1.626 m)   Exam: NAD, pleasant                  Speech:    Speech is normal; fluent and spontaneous with normal comprehension.  Cognition:    The patient is oriented to person, place, and time;     recent and remote memory intact;     language fluent;  Cranial Nerves:    The pupils are equally reactive to light but the right pupil is 42mm larger, decreased vision right eye can see outlines and shadows and light but considered legally blind right eye, left eye intact to confrontation.Trigeminal sensation is intact and the muscles of mastication are normal. The face is symmetric. The palate elevates in the  midline. Hearing intact. Voice is normal. Shoulder shrug is normal. The tongue has normal motion without fasciculations.   Coordination:  Normal FTN, no tremor  Motor Observation:    No asymmetry, no atrophy, and no involuntary movements noted. Tone:    Normal muscle tone.     Strength:    Strength is V/V in the upper and lower limbs.      Sensation: decreased in a gradient fashion to pp and temp to mid calf, intact vibration and position sense, stable from prior exams  DTRs: clonus and hyperreflexia in the lower extremities. Bilat uppers brisk. Stable from prior exams  Gait: can heel and toe walk. Normal stance and stride. Imbalance on HTS.  MMSE - Mini Mental State Exam 09/29/2020  Orientation to time 5  Orientation to Place 5  Registration 3  Attention/ Calculation 5  Recall 3  Language- name 2 objects 2  Language- repeat 1  Language- follow 3 step command 3  Language- read & follow direction 1  Write a sentence 1  Copy design 0  Total score 29        Assessment/Plan:  Very nice 66 y.o. female here as a follow up for chronic headaches and low back pain, small fiber neuropathy biopsy proven(PMhx chemotherapy). PMHx major depression, generalized anxiety, headaches, PTSD, Stage 3 kidney disease, Osteopenia, colon caner s/p right hemicolectomy 2006. Patient also has some interesting physical findings including bilateral lower extremity clonus at the patellars and the ankle jerks but imaging of the brain, cervical and thoracic spine unremarkable for UMN disease may be genetic or neurodegenerative.  Small fiber neuropathy: Continue cymbalta at 60mg . Doing great  Brain fog: improved off of gabapentin  Headaches: improved  Sore muscles in the legs: check CK, may stop statin for a month and see if that helps, f/u with pcp  Vertigo: resolved  Orders Placed This Encounter  Procedures   CK   Meds ordered this encounter  Medications   DULoxetine (CYMBALTA) 60 MG capsule     Sig: Take 1 capsule (60 mg total) by mouth daily.    Dispense:  90 capsule    Refill:  [place], MD  [facility] [address] [place], [place] [address]  Phone [phone] Fax [phone]  I spent over 30 minutes of face-to-face and non-face-to-face time with patient on the  1. Small fiber neuropathy   2. Neuropathy due to chemotherapeutic drug ([place])   3. Muscle ache   4. Brain fog   5. Bilateral headaches   6. Vertigo     diagnosis.  This included previsit chart review, lab review, study review, order entry, electronic health record documentation, patient education on the different diagnostic and therapeutic options, counseling and coordination of care, risks and benefits of management, compliance, or risk factor reduction

## 2021-04-01 NOTE — Patient Instructions (Signed)
Continue cymbalta  

## 2021-04-02 LAB — CK: Total CK: 75 U/L (ref 32–182)

## 2021-04-07 DIAGNOSIS — M6281 Muscle weakness (generalized): Secondary | ICD-10-CM | POA: Diagnosis not present

## 2021-04-07 DIAGNOSIS — N3941 Urge incontinence: Secondary | ICD-10-CM | POA: Diagnosis not present

## 2021-04-07 DIAGNOSIS — M62838 Other muscle spasm: Secondary | ICD-10-CM | POA: Diagnosis not present

## 2021-04-07 DIAGNOSIS — F411 Generalized anxiety disorder: Secondary | ICD-10-CM | POA: Diagnosis not present

## 2021-04-07 DIAGNOSIS — R152 Fecal urgency: Secondary | ICD-10-CM | POA: Diagnosis not present

## 2021-04-21 DIAGNOSIS — F411 Generalized anxiety disorder: Secondary | ICD-10-CM | POA: Diagnosis not present

## 2021-04-22 DIAGNOSIS — H5203 Hypermetropia, bilateral: Secondary | ICD-10-CM | POA: Diagnosis not present

## 2021-04-22 DIAGNOSIS — H53001 Unspecified amblyopia, right eye: Secondary | ICD-10-CM | POA: Diagnosis not present

## 2021-04-22 DIAGNOSIS — H25042 Posterior subcapsular polar age-related cataract, left eye: Secondary | ICD-10-CM | POA: Diagnosis not present

## 2021-04-22 DIAGNOSIS — D3132 Benign neoplasm of left choroid: Secondary | ICD-10-CM | POA: Diagnosis not present

## 2021-04-29 ENCOUNTER — Other Ambulatory Visit: Payer: Self-pay | Admitting: Psychiatry

## 2021-04-29 DIAGNOSIS — F4310 Post-traumatic stress disorder, unspecified: Secondary | ICD-10-CM

## 2021-04-29 DIAGNOSIS — F411 Generalized anxiety disorder: Secondary | ICD-10-CM

## 2021-05-05 DIAGNOSIS — F411 Generalized anxiety disorder: Secondary | ICD-10-CM | POA: Diagnosis not present

## 2021-05-19 DIAGNOSIS — F4320 Adjustment disorder, unspecified: Secondary | ICD-10-CM | POA: Diagnosis not present

## 2021-06-02 DIAGNOSIS — F411 Generalized anxiety disorder: Secondary | ICD-10-CM | POA: Diagnosis not present

## 2021-06-10 ENCOUNTER — Other Ambulatory Visit: Payer: Self-pay | Admitting: Psychiatry

## 2021-06-10 DIAGNOSIS — F411 Generalized anxiety disorder: Secondary | ICD-10-CM

## 2021-06-10 DIAGNOSIS — F4310 Post-traumatic stress disorder, unspecified: Secondary | ICD-10-CM

## 2021-06-12 ENCOUNTER — Other Ambulatory Visit: Payer: Self-pay | Admitting: Psychiatry

## 2021-06-12 DIAGNOSIS — F4310 Post-traumatic stress disorder, unspecified: Secondary | ICD-10-CM

## 2021-06-12 DIAGNOSIS — F411 Generalized anxiety disorder: Secondary | ICD-10-CM

## 2021-06-16 DIAGNOSIS — F411 Generalized anxiety disorder: Secondary | ICD-10-CM | POA: Diagnosis not present

## 2021-07-15 ENCOUNTER — Other Ambulatory Visit: Payer: Self-pay | Admitting: Psychiatry

## 2021-07-15 DIAGNOSIS — F331 Major depressive disorder, recurrent, moderate: Secondary | ICD-10-CM

## 2021-07-17 DIAGNOSIS — H53001 Unspecified amblyopia, right eye: Secondary | ICD-10-CM | POA: Diagnosis not present

## 2021-07-17 DIAGNOSIS — H43812 Vitreous degeneration, left eye: Secondary | ICD-10-CM | POA: Diagnosis not present

## 2021-07-21 DIAGNOSIS — H43811 Vitreous degeneration, right eye: Secondary | ICD-10-CM | POA: Diagnosis not present

## 2021-07-21 DIAGNOSIS — H43822 Vitreomacular adhesion, left eye: Secondary | ICD-10-CM | POA: Diagnosis not present

## 2021-07-21 DIAGNOSIS — D3132 Benign neoplasm of left choroid: Secondary | ICD-10-CM | POA: Diagnosis not present

## 2021-07-21 DIAGNOSIS — H43392 Other vitreous opacities, left eye: Secondary | ICD-10-CM | POA: Diagnosis not present

## 2021-07-28 DIAGNOSIS — F411 Generalized anxiety disorder: Secondary | ICD-10-CM | POA: Diagnosis not present

## 2021-08-11 DIAGNOSIS — F411 Generalized anxiety disorder: Secondary | ICD-10-CM | POA: Diagnosis not present

## 2021-08-18 DIAGNOSIS — H43392 Other vitreous opacities, left eye: Secondary | ICD-10-CM | POA: Diagnosis not present

## 2021-08-18 DIAGNOSIS — H43822 Vitreomacular adhesion, left eye: Secondary | ICD-10-CM | POA: Diagnosis not present

## 2021-08-18 DIAGNOSIS — D3132 Benign neoplasm of left choroid: Secondary | ICD-10-CM | POA: Diagnosis not present

## 2021-08-18 DIAGNOSIS — H43811 Vitreous degeneration, right eye: Secondary | ICD-10-CM | POA: Diagnosis not present

## 2021-08-25 DIAGNOSIS — F411 Generalized anxiety disorder: Secondary | ICD-10-CM | POA: Diagnosis not present

## 2021-09-07 ENCOUNTER — Ambulatory Visit: Payer: BC Managed Care – PPO | Admitting: Psychiatry

## 2021-09-08 ENCOUNTER — Other Ambulatory Visit: Payer: Self-pay | Admitting: Psychiatry

## 2021-09-08 DIAGNOSIS — F5105 Insomnia due to other mental disorder: Secondary | ICD-10-CM

## 2021-09-22 DIAGNOSIS — F411 Generalized anxiety disorder: Secondary | ICD-10-CM | POA: Diagnosis not present

## 2021-09-30 DIAGNOSIS — R300 Dysuria: Secondary | ICD-10-CM | POA: Diagnosis not present

## 2021-10-08 ENCOUNTER — Ambulatory Visit (INDEPENDENT_AMBULATORY_CARE_PROVIDER_SITE_OTHER): Payer: BC Managed Care – PPO | Admitting: Psychiatry

## 2021-10-08 ENCOUNTER — Encounter: Payer: Self-pay | Admitting: Psychiatry

## 2021-10-08 DIAGNOSIS — F4310 Post-traumatic stress disorder, unspecified: Secondary | ICD-10-CM

## 2021-10-08 DIAGNOSIS — G629 Polyneuropathy, unspecified: Secondary | ICD-10-CM

## 2021-10-08 DIAGNOSIS — G3184 Mild cognitive impairment, so stated: Secondary | ICD-10-CM

## 2021-10-08 DIAGNOSIS — F3341 Major depressive disorder, recurrent, in partial remission: Secondary | ICD-10-CM | POA: Diagnosis not present

## 2021-10-08 DIAGNOSIS — F5105 Insomnia due to other mental disorder: Secondary | ICD-10-CM

## 2021-10-08 DIAGNOSIS — F331 Major depressive disorder, recurrent, moderate: Secondary | ICD-10-CM

## 2021-10-08 DIAGNOSIS — F411 Generalized anxiety disorder: Secondary | ICD-10-CM | POA: Diagnosis not present

## 2021-10-08 MED ORDER — LAMOTRIGINE 150 MG PO TABS: 150.0000 mg | ORAL_TABLET | Freq: Every day | ORAL | 1 refills | Status: DC

## 2021-10-08 MED ORDER — DULOXETINE HCL 30 MG PO CPEP: 90.0000 mg | ORAL_CAPSULE | Freq: Every day | ORAL | 1 refills | Status: DC

## 2021-10-08 MED ORDER — BUSPIRONE HCL 30 MG PO TABS: 30.0000 mg | ORAL_TABLET | Freq: Two times a day (BID) | ORAL | 1 refills | Status: DC

## 2021-10-08 MED ORDER — ALPRAZOLAM 0.25 MG PO TABS: ORAL_TABLET | ORAL | 1 refills | Status: DC

## 2021-10-08 MED ORDER — ZOLPIDEM TARTRATE 5 MG PO TABS: 5.0000 mg | ORAL_TABLET | Freq: Every evening | ORAL | 5 refills | Status: DC | PRN

## 2021-10-08 NOTE — Progress Notes (Signed)
Lorraine May [id number] 22-Mar-1955 66 y.o.  Subjective:   Patient ID:  Lorraine May is a 66 y.o. (DOB [birth year]) female.  Chief Complaint:  Chief Complaint  Patient presents with   Follow-up   Depression   Anxiety   Post-Traumatic Stress Disorder    Anxiety Patient reports no palpitations.    Depression        Associated symptoms include fatigue and myalgias.  Past medical history includes anxiety.    Lorraine May presents to the office today for follow-up of Hx TRD with benefit from [facility] [place] 2016-8 at [facility].   Verty effective.  seen June 2020.  The following changes were made: OK trial reduction lamotrigine to 1/2 tablet in the morning and 1 in PM to see if memory is better. Discussed the risk of relapse and if it occurs to increase the medication back to the full dosage. NAC N-acetylcysteine 600 mg daily.  Off label for cognitive complaints including word-finding and STM issues.  Jan 2021 appt with the following noted: No changes with reduction in lamotrigine in cognition.  Added NAC.  Asks about Cerefolin NAC again.   Forgets details in conversation. STM px.  Past history of neuropsych testing which dx MCI.  Decided not to move again and that's a good thing.  Had wondered about reducing the meds but Covid and civil unrest and politics disturbing.  Disturbing over the depth of hatred in the [place].  Telehealth is bothersome.  Concerned memory is not as good as in the past and wonders if related to meds. No sig Xanax. Patient reports stable mood and denies depressed or irritable moods.   Patient denies difficulty with sleep initiation or maintenance.  Sleep fairly well with gabapentin and naltrexone. No med changes.  08/29/19 appt with the following noted: Vaccinated. Donepezil 5 without benefit and caused diarrhea so stopped. Stopped naltrexone bc decided it didn't help neuropathy. Patient reports mild depressed and worry over Covid.  Patient denies difficulty with sleep  initiation or maintenance. Denies appetite disturbance.  Patient reports that energy and motivation have been good.  Patient denies any difficulty with concentration.  Patient denies any suicidal ideation. Wonders about restarting naltrexone at low dose by [employer].bc it did help sleep some.  Asks I RX it. Having to use Ambien now bc foot problems. Has continued [name]. Recognizes some avoidance. Plan: Wrote prescription for patient to resume low-dose naltrexone which she had previously taken per her request  03/05/2020 appointment with the following noted: Xanax prn occ. 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  It was hard.  Moved 2 mos ago. Anxiety inducing from niece with mental health problems including paranoid delusional.  Not in consistent treatment.    Not sig depressed.  Pandemic wearing. Switched lamotrigine to 150 pm and feels knocked out in the evening. Plan: switch lamotrigine to 150 mg 1/2 tab BID bc sleepiness in evening.  I would not advise further dose reduction.   Cerefolin NAC  09/02/2020 appointment with the following noted: Did not split lamotrigine but switched it to the morning and is not so sleepy. Hardly ever used Xanax. Taking zolpidem more for sleep than she had DT stressors and mind starts racing. Used to take 10 mg zolpidem.   Gabapentin for neuropathy with SE memory and wonders about duloxetine. B alcoholic and DUI again.  Causing stress on the family. Limits news DT anxiety. Patient reports stable mood and denies depressed or irritable moods.  Patient residual anxiety.  Patient  denies difficulty with sleep initiation or maintenance. Denies appetite disturbance.  Patient reports that energy and motivation have been good.  Patient denies any difficulty with concentration.  Patient denies any suicidal ideation.  03/09/2021 appt noted: Neuro Dr. [name] started duloxetine and sees benefit.  Feet aren't burning now.  Tingling better and  neuropathy much  better.  Increased to duloxetine 60 mg tomorrow. Able to wean off the gabapentin. Feels better about meds. H's parent's needing care and stressful.  H stressed.  Her parents deceased.   Not markedly depressed.  Some anxiety over it. Taking Ambien again for sleep.   Just bought a place at [facility].  10/08/21 appt noted: Covid early August. From H mild. First time. Stress B threatens suicide but she thinks it's manipulative.  Not doing well in retirement.  M had taken care of him and now M died. Poor social development of B.  Sister cares for him.   May have to sell her beach house. 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 Not watching news bc too anxious.   Started taking Ambien again bc of stress and occ 1/2 Xanax. Duloxetine 60 from neuro helped neuropathy which caused pain.. Asks me to renew it and wants to try higher dose for partial response. Maybe feels a little more leveled out emotionally and less catastrophic thinking. Still on Buspar, lamotrigine, Ambie, prn Xanax. Tolerating meds. Good finances. But has had to help family members with money.  Past Psychiatric Medication Trials:   Nortriptyline side effects, Trintellix loss of benefit,  duloxetine 60 helped neuropathy,  mirtazapine, venlafaxine side effects, sertraline, Pristiq, Viibryd, Wellbutrin,  Depakote, carbamazepine,  lamotrigine,  gabapentin feels better off it. Seroquel, Saphris, Geodon,  TMS helped,  trazodone no response,  buspirone, gabapentin  History of gastric bypass affects the absorption of some meds.   Review of Systems:  Review of Systems  Constitutional:  Positive for fatigue.  Cardiovascular:  Negative for palpitations.  Gastrointestinal:  Negative for diarrhea.  Musculoskeletal:  Positive for arthralgias, back pain, joint swelling and myalgias.  Neurological:  Positive for numbness. Negative for tremors and weakness.       Bruning neuropathy foot and  legs   Sx worse with stress  Medications: I have reviewed the patient's current medications.  Current Outpatient Medications  Medication Sig Dispense Refill   colestipol (COLESTID) 1 g tablet Take 1 g by mouth daily.   0   dicyclomine (BENTYL) 20 MG tablet Take 20 mg by mouth as needed for spasms.      DULoxetine (CYMBALTA) 30 MG capsule Take 3 capsules (90 mg total) by mouth daily. 270 capsule 1   DULoxetine (CYMBALTA) 60 MG capsule Take 1 capsule (60 mg total) by mouth daily. 90 capsule 4   simvastatin (ZOCOR) 40 MG tablet TK 1 T PO IN THE EVENING  4   ALPRAZolam (XANAX) 0.25 MG tablet TAKE 1 TABLET(0.25 MG) BY MOUTH TWICE DAILY AS NEEDED FOR ANXIETY 15 tablet 1   busPIRone (BUSPAR) 30 MG tablet Take 1 tablet (30 mg total) by mouth 2 (two) times daily. 180 tablet 1   lamoTRIgine (LAMICTAL) 150 MG tablet Take 1 tablet (150 mg total) by mouth daily. 90 tablet 1   zolpidem (AMBIEN) 5 MG tablet Take 1 tablet (5 mg total) by mouth at bedtime as needed for sleep. 30 tablet 5   No current facility-administered medications for this visit.    Medication Side Effects: None  Allergies:  Allergies  Allergen Reactions   Demeclocycline Nausea Only   Dilaudid [Hydromorphone Hcl] Itching   Fentanyl Itching   Morphine And Related Itching   Nsaids     Cannot take due to kidney disease   Percocet [Oxycodone-Acetaminophen]     itching   Tetracyclines & Related Nausea Only   Sulfa Antibiotics Rash    Past Medical History:  Diagnosis Date   Anxiety    Anxiety and depression 08/24/2011   Arthritis    Cancer ([place])    colon   Depression    Hx of colon cancer, stage II 08/24/2011   T3N0  adenoca cecum resected 04/2004  Xeloda adjuvant chemo   Hyperlipidemia 08/24/2011   Joint pain    Nausea & vomiting    Neuropathy    PONV (postoperative nausea and vomiting)    Superficial basal cell carcinoma (BCC) 09/18/2012   Right Temple    Family History  Problem Relation Age of Onset   Diabetes  Mother        type 1   Stroke Mother    Dementia Mother    Thyroid disease Mother    Alcohol abuse Mother    Asthma Mother    Neuropathy Mother    Alcohol abuse Father    Diabetes Father    Thyroid disease Sister    Psychiatric Illness Sister    Asthma Sister    Alcohol abuse Brother    Psychiatric Illness Brother    Cancer Maternal Uncle        throat   Cancer Paternal Aunt        germ cell   Cancer Maternal Grandmother        colon    Social History   Socioeconomic History   Marital status: Married    Spouse name: Herbie [place]   Number of children: 1   Years of education: 16   Highest education level: Not on file  Occupational History   Occupation: Press photographer- Retired  Tobacco Use   Smoking status: Never   Smokeless tobacco: Never  Vaping Use   Vaping Use: Never used  Substance and Sexual Activity   Alcohol use: Yes    Comment: [place] once a week   Drug use: No   Sexual activity: Not on file  Other Topics Concern   Not on file  Social History Narrative   Lives with husband   Caffeine use: 2-3 cups daily   Right handed   Social Determinants of Health   Financial Resource Strain: Not on file  Food Insecurity: Not on file  Transportation Needs: Not on file  Physical Activity: Not on file  Stress: Not on file  Social Connections: Not on file  Intimate Partner Violence: Not on file    Past Medical History, Surgical history, Social history, and Family history were reviewed and updated as appropriate.   Please see review of systems for further details on the patient's review from today.   Objective:   Physical Exam:  There were no vitals taken for this visit.  Physical Exam Constitutional:      General: She is not in acute distress.    Appearance: She is well-developed.  Musculoskeletal:        General: No deformity.  Neurological:     Mental Status: She is alert and oriented to person, place, and time.     Motor: No tremor.     Coordination:  Coordination normal.     Gait: Gait normal.  Psychiatric:  Attention and Perception: She does not perceive auditory hallucinations.        Mood and Affect: Mood is anxious. Mood is not depressed. Affect is not labile, blunt, angry or inappropriate.        Speech: Speech normal.        Behavior: Behavior normal.        Thought Content: Thought content normal. Thought content is not delusional. Thought content does not include homicidal or suicidal ideation. Thought content does not include suicidal plan.        Cognition and Memory: She exhibits impaired recent memory.        Judgment: Judgment normal.     Comments: Insight intact. No auditory or visual hallucinations.  Somatic under stress.  But less so at this time     Lab Review:     Component Value Date/Time   NA 140 02/02/2017 1554   NA 142 08/21/2013 1445   K 5.2 02/02/2017 1554   K 4.1 08/21/2013 1445   CL 102 02/02/2017 1554   CO2 24 02/02/2017 1554   CO2 26 08/21/2013 1445   GLUCOSE 97 02/02/2017 1554   GLUCOSE 120 (H) 02/07/2014 1107   GLUCOSE 91 08/21/2013 1445   BUN 19 02/02/2017 1554   BUN 18.7 08/21/2013 1445   CREATININE 1.17 (H) 02/02/2017 1554   CREATININE 1.1 08/21/2013 1445   CALCIUM 9.3 02/02/2017 1554   CALCIUM 9.1 08/21/2013 1445   PROT 6.6 03/03/2016 1124   PROT 6.5 08/21/2013 1445   ALBUMIN 4.7 02/07/2014 1107   ALBUMIN 3.7 08/21/2013 1445   AST 21 02/07/2014 1107   AST 12 08/21/2013 1445   ALT 14 02/07/2014 1107   ALT 11 08/21/2013 1445   ALKPHOS 64 02/07/2014 1107   ALKPHOS 56 08/21/2013 1445   BILITOT 0.6 02/07/2014 1107   BILITOT 0.27 08/21/2013 1445   GFRNONAA 50 (L) 02/02/2017 1554   GFRAA 57 (L) 02/02/2017 1554       Component Value Date/Time   WBC 6.3 02/07/2014 1107   RBC 4.28 02/07/2014 1107   HGB 12.9 02/07/2014 1107   HGB 12.3 08/21/2013 1445   HCT 38.7 02/07/2014 1107   HCT 37.2 08/21/2013 1445   PLT 253 02/07/2014 1107   PLT 226 08/21/2013 1445   MCV 90.4  02/07/2014 1107   MCV 93.2 08/21/2013 1445   MCH 30.1 02/07/2014 1107   MCHC 33.3 02/07/2014 1107   RDW 13.2 02/07/2014 1107   RDW 13.8 08/21/2013 1445   LYMPHSABS 1.5 08/21/2013 1445   MONOABS 0.3 08/21/2013 1445   EOSABS 0.1 08/21/2013 1445   BASOSABS 0.0 08/21/2013 1445    No results found for: "POCLITH", "LITHIUM"   No results found for: "PHENYTOIN", "PHENOBARB", "VALPROATE", "CBMZ"   .res Assessment: Plan:    Lorraine May was seen today for follow-up, depression, anxiety and post-traumatic stress disorder.  Diagnoses and all orders for this visit:  Recurrent major depression in partial remission (HCC)  Generalized anxiety disorder -     busPIRone (BUSPAR) 30 MG tablet; Take 1 tablet (30 mg total) by mouth 2 (two) times daily. -     ALPRAZolam (XANAX) 0.25 MG tablet; TAKE 1 TABLET(0.25 MG) BY MOUTH TWICE DAILY AS NEEDED FOR ANXIETY  PTSD (post-traumatic stress disorder) -     busPIRone (BUSPAR) 30 MG tablet; Take 1 tablet (30 mg total) by mouth 2 (two) times daily. -     ALPRAZolam (XANAX) 0.25 MG tablet; TAKE 1 TABLET(0.25 MG) BY MOUTH TWICE DAILY AS NEEDED  FOR ANXIETY  Insomnia due to mental condition -     zolpidem (AMBIEN) 5 MG tablet; Take 1 tablet (5 mg total) by mouth at bedtime as needed for sleep.  Mild cognitive impairment  Small fiber neuropathy -     DULoxetine (CYMBALTA) 30 MG capsule; Take 3 capsules (90 mg total) by mouth daily.  Major depressive disorder, recurrent episode, moderate (HCC) -     lamoTRIgine (LAMICTAL) 150 MG tablet; Take 1 tablet (150 mg total) by mouth daily.   Greater than 50% of 30 min face to face time with patient was spent on counseling and coordination of care. We discussed  As can be seen the patient is failed a long list of psychiatric medications.  Fortunately she did respond to [place] for depression.  Cont meds that are working and tolerating well.  Maintained the TMS benefit for depression.  Marland Kitchen    Anxiety symptoms are worse with  Covid and worry over neice.    Asks about  increase duloxetine for neuropathy.  She read about it.  Answered questions about it.  Benefit of duloxetine for neuropathy discussed in detail. Agree with increase duloxetine 90 mg daily and disc SE in answer to questions bc partial response..  Mood is stable.  Continue Cerefolin-NAC which she took in the past for MCI. She feels it's helpful and wants to continue it.  Has continued telehealth with therapist [name]. And it's helpful.  Because of continued neurocognitive complaints with a history of diagnosis of mild cognitive impairment by neuropsych testing we rec lithium but she has stigma about it.  continue lamotrigine to 150 mg daily.  I would not advise further dose reduction.    Lamotrigine is being used to reduce risk of recurrent depression given multiple med failures as noted above. Answered questions on need to continue longterm bc high relapse risk.  Her sleep is managed but we also discussed the potential that zolpidem is interfering with her memory.  Consider alternatives.  Disc difference with generics. OK zolpidem 2.5-5 mg HS.  No SE  This appt was 30 mins.  FU 6 mos.  Lynder Parents, MD, DFAPA   Please see After Visit Summary for patient specific instructions.  Future Appointments  Date Time Provider [place]  11/04/2021  3:00 PM Cottle, Billey Co., MD [facility] None    No orders of the defined types were placed in this encounter.      -------------------------------

## 2021-10-14 ENCOUNTER — Telehealth: Payer: Self-pay

## 2021-10-14 NOTE — Telephone Encounter (Signed)
Prior Authorization initiated for DULOXETINE 30 MG #270/90 day, takes 3 daily, submitted through [place], pending response.

## 2021-10-14 NOTE — Telephone Encounter (Signed)
Approval received effective 10/14/2021-10/13/2022 with [employer] Duloxetine 30 mg 3 pills daily

## 2021-10-19 ENCOUNTER — Other Ambulatory Visit: Payer: Self-pay | Admitting: Obstetrics and Gynecology

## 2021-10-19 DIAGNOSIS — Z1231 Encounter for screening mammogram for malignant neoplasm of breast: Secondary | ICD-10-CM

## 2021-10-20 DIAGNOSIS — F411 Generalized anxiety disorder: Secondary | ICD-10-CM | POA: Diagnosis not present

## 2021-11-03 DIAGNOSIS — F411 Generalized anxiety disorder: Secondary | ICD-10-CM | POA: Diagnosis not present

## 2021-11-04 ENCOUNTER — Ambulatory Visit: Payer: BC Managed Care – PPO | Admitting: Psychiatry

## 2021-11-11 ENCOUNTER — Ambulatory Visit
Admission: RE | Admit: 2021-11-11 | Discharge: 2021-11-11 | Disposition: A | Discharge status: Home / Self Care | Payer: BC Managed Care – PPO | Source: Ambulatory Visit | Attending: Obstetrics and Gynecology | Admitting: Obstetrics and Gynecology

## 2021-11-11 DIAGNOSIS — Z1231 Encounter for screening mammogram for malignant neoplasm of breast: Secondary | ICD-10-CM

## 2021-11-17 DIAGNOSIS — F411 Generalized anxiety disorder: Secondary | ICD-10-CM | POA: Diagnosis not present

## 2021-12-09 ENCOUNTER — Other Ambulatory Visit: Payer: Self-pay | Admitting: Psychiatry

## 2021-12-09 DIAGNOSIS — F4310 Post-traumatic stress disorder, unspecified: Secondary | ICD-10-CM

## 2021-12-09 DIAGNOSIS — F411 Generalized anxiety disorder: Secondary | ICD-10-CM

## 2021-12-15 DIAGNOSIS — F411 Generalized anxiety disorder: Secondary | ICD-10-CM | POA: Diagnosis not present

## 2021-12-28 DIAGNOSIS — K529 Noninfective gastroenteritis and colitis, unspecified: Secondary | ICD-10-CM | POA: Diagnosis not present

## 2021-12-28 DIAGNOSIS — Z85038 Personal history of other malignant neoplasm of large intestine: Secondary | ICD-10-CM | POA: Diagnosis not present

## 2022-01-05 DIAGNOSIS — F411 Generalized anxiety disorder: Secondary | ICD-10-CM | POA: Diagnosis not present

## 2022-01-12 DIAGNOSIS — F411 Generalized anxiety disorder: Secondary | ICD-10-CM | POA: Diagnosis not present

## 2022-02-09 DIAGNOSIS — F411 Generalized anxiety disorder: Secondary | ICD-10-CM | POA: Diagnosis not present

## 2022-02-23 DIAGNOSIS — F411 Generalized anxiety disorder: Secondary | ICD-10-CM | POA: Diagnosis not present

## 2022-03-23 DIAGNOSIS — F411 Generalized anxiety disorder: Secondary | ICD-10-CM | POA: Diagnosis not present

## 2022-03-30 ENCOUNTER — Other Ambulatory Visit: Payer: Self-pay | Admitting: Psychiatry

## 2022-03-30 DIAGNOSIS — F331 Major depressive disorder, recurrent, moderate: Secondary | ICD-10-CM

## 2022-04-02 ENCOUNTER — Other Ambulatory Visit: Payer: Self-pay | Admitting: Psychiatry

## 2022-04-02 DIAGNOSIS — F4310 Post-traumatic stress disorder, unspecified: Secondary | ICD-10-CM

## 2022-04-02 DIAGNOSIS — F411 Generalized anxiety disorder: Secondary | ICD-10-CM

## 2022-04-06 ENCOUNTER — Encounter: Payer: Self-pay | Admitting: Psychiatry

## 2022-04-06 ENCOUNTER — Ambulatory Visit: Payer: BC Managed Care – PPO | Admitting: Psychiatry

## 2022-04-06 DIAGNOSIS — G629 Polyneuropathy, unspecified: Secondary | ICD-10-CM

## 2022-04-06 DIAGNOSIS — F5105 Insomnia due to other mental disorder: Secondary | ICD-10-CM

## 2022-04-06 DIAGNOSIS — F411 Generalized anxiety disorder: Secondary | ICD-10-CM | POA: Diagnosis not present

## 2022-04-06 DIAGNOSIS — F331 Major depressive disorder, recurrent, moderate: Secondary | ICD-10-CM

## 2022-04-06 DIAGNOSIS — F4310 Post-traumatic stress disorder, unspecified: Secondary | ICD-10-CM

## 2022-04-06 MED ORDER — ALPRAZOLAM 0.25 MG PO TABS: ORAL_TABLET | ORAL | 2 refills | Status: DC

## 2022-04-06 MED ORDER — BUSPIRONE HCL 30 MG PO TABS: 30.0000 mg | ORAL_TABLET | Freq: Two times a day (BID) | ORAL | 3 refills | Status: DC

## 2022-04-06 MED ORDER — DULOXETINE HCL 60 MG PO CPEP: 60.0000 mg | ORAL_CAPSULE | Freq: Every day | ORAL | 4 refills | Status: DC

## 2022-04-06 MED ORDER — ZOLPIDEM TARTRATE 5 MG PO TABS: 5.0000 mg | ORAL_TABLET | Freq: Every evening | ORAL | 5 refills | Status: DC | PRN

## 2022-04-06 MED ORDER — LAMOTRIGINE 150 MG PO TABS: 150.0000 mg | ORAL_TABLET | Freq: Every day | ORAL | 3 refills | Status: DC

## 2022-04-06 MED ORDER — DULOXETINE HCL 30 MG PO CPEP: 30.0000 mg | ORAL_CAPSULE | Freq: Every day | ORAL | 1 refills | Status: DC

## 2022-04-06 NOTE — Progress Notes (Signed)
Lorraine May [id number]:[id number] 1955/10/17 67 y.o.  Subjective:   Patient ID:  Lorraine May is a 67 y.o. (DOB 12/04/1955) female.  Chief Complaint:  Chief Complaint  Patient presents with   Follow-up   Anxiety   Other    pain    Anxiety Patient reports no chest pain or palpitations.    Depression        Associated symptoms include fatigue and myalgias.  Past medical history includes anxiety.    Lorraine May presents to the office today for follow-up of Hx TRD with benefit from [facility] 2016-8 at [facility].   Verty effective.  seen June 2020.  The following changes were made: OK trial reduction lamotrigine to 1/2 tablet in the morning and 1 in PM to see if memory is better. Discussed the risk of relapse and if it occurs to increase the medication back to the full dosage. NAC N-acetylcysteine 600 mg daily.  Off label for cognitive complaints including word-finding and STM issues.  Jan 2021 appt with the following noted: No changes with reduction in lamotrigine in cognition.  Added NAC.  Asks about Cerefolin NAC again.   Forgets details in conversation. STM px.  Past history of neuropsych testing which dx MCI.  Decided not to move again and that's a good thing.  Had wondered about reducing the meds but Covid and civil unrest and politics disturbing.  Disturbing over the depth of [place] in the [place].  Telehealth is bothersome.  Concerned memory is not as good as in the past and wonders if related to meds. No sig Xanax. Patient reports stable mood and denies depressed or irritable moods.   Patient denies difficulty with sleep initiation or maintenance.  Sleep fairly well with gabapentin and naltrexone. No med changes.  08/29/19 appt with the following noted: Vaccinated. Donepezil 5 without benefit and caused diarrhea so stopped. Stopped naltrexone bc decided it didn't help neuropathy. Patient reports mild depressed and worry over Covid.  Patient denies difficulty with sleep initiation or  maintenance. Denies appetite disturbance.  Patient reports that energy and motivation have been good.  Patient denies any difficulty with concentration.  Patient denies any suicidal ideation. Wonders about restarting naltrexone at low dose by [employer].bc it did help sleep some.  Asks I RX it. Having to use Ambien now bc foot problems. Has continued [name]. Recognizes some avoidance. Plan: Wrote prescription for patient to resume low-dose naltrexone which she had previously taken per her request  03/05/2020 appointment with the following noted: Xanax prn occ. 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  It was hard.  Moved 2 mos ago. Anxiety inducing from niece with mental health problems including paranoid delusional.  Not in consistent treatment.    Not sig depressed.  Pandemic wearing. Switched lamotrigine to 150 pm and feels knocked out in the evening. Plan: switch lamotrigine to 150 mg 1/2 tab BID bc sleepiness in evening.  I would not advise further dose reduction.   Cerefolin NAC  09/02/2020 appointment with the following noted: Did not split lamotrigine but switched it to the morning and is not so sleepy. Hardly ever used Xanax. Taking zolpidem more for sleep than she had DT stressors and mind starts racing. Used to take 10 mg zolpidem.   Gabapentin for neuropathy with SE memory and wonders about duloxetine. B alcoholic and DUI again.  Causing stress on the family. Limits news DT anxiety. Patient reports stable mood and denies depressed or irritable moods.  Patient residual anxiety.  Patient denies difficulty with sleep initiation or maintenance. Denies appetite disturbance.  Patient reports that energy and motivation have been good.  Patient denies any difficulty with concentration.  Patient denies any suicidal ideation.  03/09/2021 appt noted: Neuro Dr. [name] started duloxetine and sees benefit.  Feet aren't burning now.  Tingling better and  neuropathy much better.  Increased  to duloxetine 60 mg tomorrow. Able to wean off the gabapentin. Feels better about meds. H's parent's needing care and stressful.  H stressed.  Her parents deceased.   Not markedly depressed.  Some anxiety over it. Taking Ambien again for sleep.   Just bought a [facility] at [facility].  10/08/21 appt noted: Covid early August. From H mild. First time. Stress B threatens suicide but she thinks it's manipulative.  Not doing well in retirement.  M had taken care of him and now M died. Poor social development of B.  Sister cares for him.   May have to sell her beach house. 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 Not watching news bc too anxious.   Started taking Ambien again bc of stress and occ 1/2 Xanax. Duloxetine 60 from neuro helped neuropathy which caused pain.. Asks me to renew it and wants to try higher dose for partial response. Maybe feels a little more leveled out emotionally and less catastrophic thinking. Still on Buspar, lamotrigine, Ambie, prn Xanax. Tolerating meds. Good finances. But has had to help family members with money.  04/06/22 appt noted: Couldn't increase duloxetine 90  bc insur wouldn't cover.  She would like to try for Neuropathy.  Gets some relief and more than gabapentin with it. On 60 mg now without SE On buspirone 30 BID, lamotrigine 150 daily, Ambien 5, alprazolam 0.25 mg rarely. No SE. Some anxiety but not depressed. Sleep well with Ambien.   Past Psychiatric Medication Trials:   Nortriptyline side effects, Trintellix loss of benefit,  duloxetine 60 helped neuropathy,  mirtazapine, venlafaxine side effects, sertraline, Pristiq, Viibryd, Wellbutrin,  Depakote, carbamazepine,  lamotrigine,  gabapentin feels better off it. Seroquel, Saphris, Geodon,  TMS helped,  trazodone no response,  buspirone, gabapentin  History of gastric bypass affects the absorption of some meds.   Review of Systems:  Review of Systems   Constitutional:  Positive for fatigue.  Cardiovascular:  Negative for chest pain and palpitations.  Gastrointestinal:  Negative for diarrhea.  Musculoskeletal:  Positive for arthralgias, back pain, joint swelling and myalgias.  Neurological:  Positive for numbness. Negative for tremors.       Bruning neuropathy foot and legs   Psychiatric/Behavioral:  Positive for depression.   Sx worse with stress  Medications: I have reviewed the patient's current medications.  Current Outpatient Medications  Medication Sig Dispense Refill   ALPRAZolam (XANAX) 0.25 MG tablet TAKE 1 TABLET(0.25 MG) BY MOUTH TWICE DAILY AS NEEDED FOR ANXIETY 15 tablet 2   busPIRone (BUSPAR) 30 MG tablet Take 1 tablet (30 mg total) by mouth 2 (two) times daily. 180 tablet 3   colestipol (COLESTID) 1 g tablet Take 1 g by mouth daily.   0   dicyclomine (BENTYL) 20 MG tablet Take 20 mg by mouth as needed for spasms.      DULoxetine (CYMBALTA) 60 MG capsule Take 1 capsule (60 mg total) by mouth daily. 90 capsule 4   lamoTRIgine (LAMICTAL) 150 MG tablet Take 1 tablet (150 mg total) by mouth daily. 90 tablet 3   simvastatin (ZOCOR)  40 MG tablet TK 1 T PO IN THE EVENING  4   zolpidem (AMBIEN) 5 MG tablet Take 1 tablet (5 mg total) by mouth at bedtime as needed for sleep. 30 tablet 5   DULoxetine (CYMBALTA) 30 MG capsule Take 1 capsule (30 mg total) by mouth daily. (Patient not taking: Reported on 04/06/2022) 90 capsule 1   No current facility-administered medications for this visit.    Medication Side Effects: None  Allergies:  Allergies  Allergen Reactions   Demeclocycline Nausea Only   Dilaudid [Hydromorphone Hcl] Itching   Fentanyl Itching   Morphine And Related Itching   Nsaids     Cannot take due to kidney disease   Percocet [Oxycodone-Acetaminophen]     itching   Tetracyclines & Related Nausea Only   Sulfa Antibiotics Rash    Past Medical History:  Diagnosis Date   Anxiety    Anxiety and depression  08/24/2011   Arthritis    Cancer ([place])    colon   Depression    Hx of colon cancer, stage II 08/24/2011   T3N0  adenoca cecum resected 04/2004  Xeloda adjuvant chemo   Hyperlipidemia 08/24/2011   Joint pain    Nausea & vomiting    Neuropathy    PONV (postoperative nausea and vomiting)    Superficial basal cell carcinoma (BCC) 09/18/2012   Right Temple    Family History  Problem Relation Age of Onset   Diabetes Mother        type 1   Stroke Mother    Dementia Mother    Thyroid disease Mother    Alcohol abuse Mother    Asthma Mother    Neuropathy Mother    Alcohol abuse Father    Diabetes Father    Thyroid disease Sister    Psychiatric Illness Sister    Asthma Sister    Alcohol abuse Brother    Psychiatric Illness Brother    Cancer Maternal Uncle        throat   Cancer Paternal Aunt        germ cell   Cancer Maternal Grandmother        colon    Social History   Socioeconomic History   Marital status: Married    Spouse name: Herbie [place]   Number of children: 1   Years of education: 16   Highest education level: Not on file  Occupational History   Occupation: Press photographer- Retired  Tobacco Use   Smoking status: Never   Smokeless tobacco: Never  Vaping Use   Vaping Use: Never used  Substance and Sexual Activity   Alcohol use: Yes    Comment: [place] once a week   Drug use: No   Sexual activity: Not on file  Other Topics Concern   Not on file  Social History Narrative   Lives with husband   Caffeine use: 2-3 cups daily   Right handed   Social Determinants of Health   Financial Resource Strain: Not on file  Food Insecurity: Not on file  Transportation Needs: Not on file  Physical Activity: Not on file  Stress: Not on file  Social Connections: Not on file  Intimate Partner Violence: Not on file    Past Medical History, Surgical history, Social history, and Family history were reviewed and updated as appropriate.   Please see review of systems for further  details on the patient's review from today.   Objective:   Physical Exam:  There were no vitals taken for  this visit.  Physical Exam Constitutional:      General: She is not in acute distress.    Appearance: She is well-developed.  Musculoskeletal:        General: No deformity.  Neurological:     Mental Status: She is alert and oriented to person, place, and time.     Motor: No tremor.     Coordination: Coordination normal.     Gait: Gait normal.  Psychiatric:        Attention and Perception: She does not perceive auditory hallucinations.        Mood and Affect: Mood is anxious. Mood is not depressed. Affect is not labile, blunt, angry or inappropriate.        Speech: Speech normal. Speech is not slurred.        Behavior: Behavior normal.        Thought Content: Thought content normal. Thought content is not delusional. Thought content does not include homicidal or suicidal ideation. Thought content does not include suicidal plan.        Cognition and Memory: She exhibits impaired recent memory.        Judgment: Judgment normal.     Comments: Insight intact. No auditory or visual hallucinations.  Somatic under stress.  But less so at this time     Lab Review:     Component Value Date/Time   NA 140 02/02/2017 1554   NA 142 08/21/2013 1445   K 5.2 02/02/2017 1554   K 4.1 08/21/2013 1445   CL 102 02/02/2017 1554   CO2 24 02/02/2017 1554   CO2 26 08/21/2013 1445   GLUCOSE 97 02/02/2017 1554   GLUCOSE 120 (H) 02/07/2014 1107   GLUCOSE 91 08/21/2013 1445   BUN 19 02/02/2017 1554   BUN 18.7 08/21/2013 1445   CREATININE 1.17 (H) 02/02/2017 1554   CREATININE 1.1 08/21/2013 1445   CALCIUM 9.3 02/02/2017 1554   CALCIUM 9.1 08/21/2013 1445   PROT 6.6 03/03/2016 1124   PROT 6.5 08/21/2013 1445   ALBUMIN 4.7 02/07/2014 1107   ALBUMIN 3.7 08/21/2013 1445   AST 21 02/07/2014 1107   AST 12 08/21/2013 1445   ALT 14 02/07/2014 1107   ALT 11 08/21/2013 1445   ALKPHOS 64  02/07/2014 1107   ALKPHOS 56 08/21/2013 1445   BILITOT 0.6 02/07/2014 1107   BILITOT 0.27 08/21/2013 1445   GFRNONAA 50 (L) 02/02/2017 1554   GFRAA 57 (L) 02/02/2017 1554       Component Value Date/Time   WBC 6.3 02/07/2014 1107   RBC 4.28 02/07/2014 1107   HGB 12.9 02/07/2014 1107   HGB 12.3 08/21/2013 1445   HCT 38.7 02/07/2014 1107   HCT 37.2 08/21/2013 1445   PLT 253 02/07/2014 1107   PLT 226 08/21/2013 1445   MCV 90.4 02/07/2014 1107   MCV 93.2 08/21/2013 1445   MCH 30.1 02/07/2014 1107   MCHC 33.3 02/07/2014 1107   RDW 13.2 02/07/2014 1107   RDW 13.8 08/21/2013 1445   LYMPHSABS 1.5 08/21/2013 1445   MONOABS 0.3 08/21/2013 1445   EOSABS 0.1 08/21/2013 1445   BASOSABS 0.0 08/21/2013 1445    No results found for: "POCLITH", "LITHIUM"   No results found for: "PHENYTOIN", "PHENOBARB", "VALPROATE", "CBMZ"   .res Assessment: Plan:    Lorraine May was seen today for follow-up, anxiety and other.  Diagnoses and all orders for this visit:  Small fiber neuropathy -     DULoxetine (CYMBALTA) 60 MG capsule; Take 1 capsule (  60 mg total) by mouth daily. -     DULoxetine (CYMBALTA) 30 MG capsule; Take 1 capsule (30 mg total) by mouth daily. (Patient not taking: Reported on 04/06/2022)  Generalized anxiety disorder -     busPIRone (BUSPAR) 30 MG tablet; Take 1 tablet (30 mg total) by mouth 2 (two) times daily. -     ALPRAZolam (XANAX) 0.25 MG tablet; TAKE 1 TABLET(0.25 MG) BY MOUTH TWICE DAILY AS NEEDED FOR ANXIETY  PTSD (post-traumatic stress disorder) -     busPIRone (BUSPAR) 30 MG tablet; Take 1 tablet (30 mg total) by mouth 2 (two) times daily. -     ALPRAZolam (XANAX) 0.25 MG tablet; TAKE 1 TABLET(0.25 MG) BY MOUTH TWICE DAILY AS NEEDED FOR ANXIETY  Major depressive disorder, recurrent episode, moderate (HCC) -     lamoTRIgine (LAMICTAL) 150 MG tablet; Take 1 tablet (150 mg total) by mouth daily.  Insomnia due to mental condition -     zolpidem (AMBIEN) 5 MG tablet; Take 1  tablet (5 mg total) by mouth at bedtime as needed for sleep.   Greater than 50% of 30 min face to face time with patient was spent on counseling and coordination of care. We discussed  As can be seen the patient is failed a long list of psychiatric medications.  Fortunately she did respond to [place] for depression.  Cont meds that are working and tolerating well.  Maintained the TMS benefit for depression.  Marland Kitchen    Anxiety symptoms are worse with Covid and worry over neice.    Asks about  increase duloxetine for neuropathy.  She read about it.  Answered questions about it.  Benefit of duloxetine for neuropathy discussed in detail. Agree with increase duloxetine 90 mg daily and disc SE in answer to questions bc partial response.for burning neuropathy.  This didn't happen at last appt.  Call if tolerated and NR and can increase to max 60 mg BID  Mood is stable.  Continue Cerefolin-NAC which she took in the past for MCI. She feels it's helpful and wants to continue it.  Has continued telehealth with therapist [name]. And it's helpful.  Because of continued neurocognitive complaints with a history of diagnosis of mild cognitive impairment by neuropsych testing we rec lithium but she has stigma about it.  continue lamotrigine to 150 mg daily.  I would not advise further dose reduction.    Lamotrigine is being used to reduce risk of recurrent depression given multiple med failures as noted above. Answered questions on need to continue longterm bc high relapse risk.  Her sleep is managed but we also discussed the potential that zolpidem is interfering with her memory.  Consider alternatives.  Disc difference with generics. OK zolpidem 2.5-5 mg HS.  No SE  This appt was 30 mins.  FU 6 mos.  Lynder Parents, MD, DFAPA   Please see After Visit Summary for patient specific instructions.  Future Appointments  Date Time Provider [place]  10/12/2022 11:00 AM Cottle, Billey Co., MD  CP-CP None     No orders of the defined types were placed in this encounter.      -------------------------------

## 2022-04-08 DIAGNOSIS — E7800 Pure hypercholesterolemia, unspecified: Secondary | ICD-10-CM | POA: Diagnosis not present

## 2022-04-08 DIAGNOSIS — N1831 Chronic kidney disease, stage 3a: Secondary | ICD-10-CM | POA: Diagnosis not present

## 2022-04-08 DIAGNOSIS — Z0000 Encounter for general adult medical examination without abnormal findings: Secondary | ICD-10-CM | POA: Diagnosis not present

## 2022-04-08 DIAGNOSIS — E538 Deficiency of other specified B group vitamins: Secondary | ICD-10-CM | POA: Diagnosis not present

## 2022-04-08 DIAGNOSIS — E559 Vitamin D deficiency, unspecified: Secondary | ICD-10-CM | POA: Diagnosis not present

## 2022-04-19 DIAGNOSIS — Z13820 Encounter for screening for osteoporosis: Secondary | ICD-10-CM | POA: Diagnosis not present

## 2022-04-19 DIAGNOSIS — Z6828 Body mass index (BMI) 28.0-28.9, adult: Secondary | ICD-10-CM | POA: Diagnosis not present

## 2022-04-19 DIAGNOSIS — Z01419 Encounter for gynecological examination (general) (routine) without abnormal findings: Secondary | ICD-10-CM | POA: Diagnosis not present

## 2022-04-20 DIAGNOSIS — F411 Generalized anxiety disorder: Secondary | ICD-10-CM | POA: Diagnosis not present

## 2022-05-04 DIAGNOSIS — F411 Generalized anxiety disorder: Secondary | ICD-10-CM | POA: Diagnosis not present

## 2022-05-11 DIAGNOSIS — H5203 Hypermetropia, bilateral: Secondary | ICD-10-CM | POA: Diagnosis not present

## 2022-05-11 DIAGNOSIS — H25042 Posterior subcapsular polar age-related cataract, left eye: Secondary | ICD-10-CM | POA: Diagnosis not present

## 2022-05-11 DIAGNOSIS — H2511 Age-related nuclear cataract, right eye: Secondary | ICD-10-CM | POA: Diagnosis not present

## 2022-05-11 DIAGNOSIS — H53001 Unspecified amblyopia, right eye: Secondary | ICD-10-CM | POA: Diagnosis not present

## 2022-05-11 DIAGNOSIS — D3132 Benign neoplasm of left choroid: Secondary | ICD-10-CM | POA: Diagnosis not present

## 2022-05-18 DIAGNOSIS — F411 Generalized anxiety disorder: Secondary | ICD-10-CM | POA: Diagnosis not present

## 2022-06-15 DIAGNOSIS — F411 Generalized anxiety disorder: Secondary | ICD-10-CM | POA: Diagnosis not present

## 2022-06-29 DIAGNOSIS — F411 Generalized anxiety disorder: Secondary | ICD-10-CM | POA: Diagnosis not present

## 2022-07-13 DIAGNOSIS — F411 Generalized anxiety disorder: Secondary | ICD-10-CM | POA: Diagnosis not present

## 2022-07-23 ENCOUNTER — Telehealth: Payer: Self-pay | Admitting: Psychiatry

## 2022-07-23 DIAGNOSIS — G629 Polyneuropathy, unspecified: Secondary | ICD-10-CM

## 2022-07-23 MED ORDER — DULOXETINE HCL 60 MG PO CPEP: 60.0000 mg | ORAL_CAPSULE | Freq: Two times a day (BID) | ORAL | 1 refills | Status: DC

## 2022-07-23 NOTE — Telephone Encounter (Signed)
Patient said she needed for her neuropathy.  No sweating or other SE. Rx sent to requested pharmacy per note and her request.

## 2022-07-23 NOTE — Telephone Encounter (Signed)
LVM to RC 

## 2022-07-23 NOTE — Telephone Encounter (Signed)
Pt called at 11:45a.  She said she and Dr Jennelle Human discussed increasing her Cymbalta from 30mg  and 60mg  to both 60mg  dosages.  She would like to do that.  Pls send script to [facility] on [address] in [facility].  Next appt 9/10

## 2022-07-23 NOTE — Telephone Encounter (Signed)
From 3/5 note:  Agree with increase duloxetine 90 mg daily and disc SE in answer to questions bc partial response.for burning neuropathy.  This didn't happen at last appt.  Call if tolerated and NR and can increase to max 60 mg BID

## 2022-07-27 DIAGNOSIS — F411 Generalized anxiety disorder: Secondary | ICD-10-CM | POA: Diagnosis not present

## 2022-08-10 DIAGNOSIS — F411 Generalized anxiety disorder: Secondary | ICD-10-CM | POA: Diagnosis not present

## 2022-09-01 DIAGNOSIS — N3001 Acute cystitis with hematuria: Secondary | ICD-10-CM | POA: Diagnosis not present

## 2022-09-01 DIAGNOSIS — R350 Frequency of micturition: Secondary | ICD-10-CM | POA: Diagnosis not present

## 2022-09-07 DIAGNOSIS — F411 Generalized anxiety disorder: Secondary | ICD-10-CM | POA: Diagnosis not present

## 2022-09-13 DIAGNOSIS — F411 Generalized anxiety disorder: Secondary | ICD-10-CM | POA: Diagnosis not present

## 2022-09-19 ENCOUNTER — Other Ambulatory Visit: Payer: Self-pay | Admitting: Psychiatry

## 2022-09-19 DIAGNOSIS — F411 Generalized anxiety disorder: Secondary | ICD-10-CM

## 2022-09-19 DIAGNOSIS — F4310 Post-traumatic stress disorder, unspecified: Secondary | ICD-10-CM

## 2022-09-21 DIAGNOSIS — F411 Generalized anxiety disorder: Secondary | ICD-10-CM | POA: Diagnosis not present

## 2022-10-05 DIAGNOSIS — F411 Generalized anxiety disorder: Secondary | ICD-10-CM | POA: Diagnosis not present

## 2022-10-12 ENCOUNTER — Ambulatory Visit (INDEPENDENT_AMBULATORY_CARE_PROVIDER_SITE_OTHER): Payer: BC Managed Care – PPO | Admitting: Psychiatry

## 2022-10-12 ENCOUNTER — Other Ambulatory Visit: Payer: Self-pay | Admitting: Psychiatry

## 2022-10-12 ENCOUNTER — Encounter: Payer: Self-pay | Admitting: Psychiatry

## 2022-10-12 DIAGNOSIS — F411 Generalized anxiety disorder: Secondary | ICD-10-CM

## 2022-10-12 DIAGNOSIS — F5105 Insomnia due to other mental disorder: Secondary | ICD-10-CM

## 2022-10-12 DIAGNOSIS — G629 Polyneuropathy, unspecified: Secondary | ICD-10-CM | POA: Diagnosis not present

## 2022-10-12 DIAGNOSIS — F331 Major depressive disorder, recurrent, moderate: Secondary | ICD-10-CM

## 2022-10-12 DIAGNOSIS — F4310 Post-traumatic stress disorder, unspecified: Secondary | ICD-10-CM

## 2022-10-12 MED ORDER — ALPRAZOLAM 0.25 MG PO TABS: ORAL_TABLET | ORAL | 0 refills | Status: DC

## 2022-10-12 MED ORDER — ZOLPIDEM TARTRATE 5 MG PO TABS
5.0000 mg | ORAL_TABLET | Freq: Every evening | ORAL | 5 refills | Status: DC | PRN
Start: 2022-10-12 — End: 2023-04-12

## 2022-10-12 MED ORDER — DULOXETINE HCL 60 MG PO CPEP: 60.0000 mg | ORAL_CAPSULE | Freq: Two times a day (BID) | ORAL | 1 refills | Status: DC

## 2022-10-12 NOTE — Progress Notes (Signed)
Lorraine May [id number] 06/30/1955 67 y.o.  Subjective:   Patient ID:  Lorraine May is a 67 y.o. (DOB 1955-04-02) female.  Chief Complaint:  Chief Complaint  Patient presents with   Follow-up    Anxiety Patient reports no chest pain or palpitations.    Depression        Associated symptoms include fatigue and myalgias.  Past medical history includes anxiety.    Lorraine May presents to the office today for follow-up of Hx TRD with benefit from TMS 2016-8 at [facility].   Verty effective.  seen June 2020.  The following changes were made: OK trial reduction lamotrigine to 1/2 tablet in the morning and 1 in PM to see if memory is better. Discussed the risk of relapse and if it occurs to increase the medication back to the full dosage. NAC N-acetylcysteine 600 mg daily.  Off label for cognitive complaints including word-finding and STM issues.  Jan 2021 appt with the following noted: No changes with reduction in lamotrigine in cognition.  Added NAC.  Asks about Cerefolin NAC again.   Forgets details in conversation. STM px.  Past history of neuropsych testing which dx MCI.  Decided not to move again and that's a good thing.  Had wondered about reducing the meds but Covid and civil unrest and politics disturbing.  Disturbing over the depth of hatred in the country.  Telehealth is bothersome.  Concerned memory is not as good as in the past and wonders if related to meds. No sig Xanax. Patient reports stable mood and denies depressed or irritable moods.   Patient denies difficulty with sleep initiation or maintenance.  Sleep fairly well with gabapentin and naltrexone. No med changes.  08/29/19 appt with the following noted: Vaccinated. Donepezil 5 without benefit and caused diarrhea so stopped. Stopped naltrexone bc decided it didn't help neuropathy. Patient reports mild depressed and worry over Covid.  Patient denies difficulty with sleep initiation or maintenance. Denies appetite  disturbance.  Patient reports that energy and motivation have been good.  Patient denies any difficulty with concentration.  Patient denies any suicidal ideation. Wonders about restarting naltrexone at low dose by Lorraine May.bc it did help sleep some.  Asks I RX it. Having to use Ambien now bc foot problems. Has continued Lorraine Cahill phd. Recognizes some avoidance. Plan: Wrote prescription for patient to resume low-dose naltrexone which she had previously taken per her request  03/05/2020 appointment with the following noted: Xanax prn occ. 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  It was hard.  Moved 2 mos ago. Anxiety inducing from niece with mental health problems including paranoid delusional.  Not in consistent treatment.    Not sig depressed.  Pandemic wearing. Switched lamotrigine to 150 pm and feels knocked out in the evening. Plan: switch lamotrigine to 150 mg 1/2 tab BID bc sleepiness in evening.  I would not advise further dose reduction.   Cerefolin NAC  09/02/2020 appointment with the following noted: Did not split lamotrigine but switched it to the morning and is not so sleepy. Hardly ever used Xanax. Taking zolpidem more for sleep than she had DT stressors and mind starts racing. Used to take 10 mg zolpidem.   Gabapentin for neuropathy with SE memory and wonders about duloxetine. B alcoholic and DUI again.  Causing stress on the family. Limits news DT anxiety. Patient reports stable mood and denies depressed or irritable moods.  Patient residual anxiety.  Patient denies difficulty with sleep initiation or maintenance. Denies  appetite disturbance.  Patient reports that energy and motivation have been good.  Patient denies any difficulty with concentration.  Patient denies any suicidal ideation.  03/09/2021 appt noted: Neuro Dr. Daisy May started duloxetine and sees benefit.  Feet aren't burning now.  Tingling better and  neuropathy much better.  Increased to duloxetine 60 mg  tomorrow. Able to wean off the gabapentin. Feels better about meds. H's parent's needing care and stressful.  H stressed.  Her parents deceased.   Not markedly depressed.  Some anxiety over it. Taking Ambien again for sleep.   Just bought a place at [facility].  10/08/21 appt noted: Covid early August. From H mild. First time. Stress B threatens suicide but she thinks it's manipulative.  Not doing well in retirement.  M had taken care of him and now M died. Poor social development of B.  Sister cares for him.   May have to sell her beach house. 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 Not watching news bc too anxious.   Started taking Ambien again bc of stress and occ 1/2 Xanax. Duloxetine 60 from neuro helped neuropathy which caused pain.. Asks me to renew it and wants to try higher dose for partial response. Maybe feels a little more leveled out emotionally and less catastrophic thinking. Still on Buspar, lamotrigine, Ambie, prn Xanax. Tolerating meds. Good finances. But has had to help family members with money.  04/06/22 appt noted: Couldn't increase duloxetine 90  bc insur wouldn't cover.  She would like to try for Neuropathy.  Gets some relief and more than gabapentin with it. On 60 mg now without SE On buspirone 30 BID, lamotrigine 150 daily, Ambien 5, alprazolam 0.25 mg rarely. No SE. Some anxiety but not depressed. Sleep well with Ambien. Plan: Agree with increase duloxetine 90 mg daily and disc SE in answer to questions bc partial response.for burning neuropathy.  This didn't happen at last appt.  Call if tolerated and NR and can increase to max 60 mg BID  07/23/22 incr duloxetine 120 mg for neuropathy  10/12/22 appt noted; Psych med: Zolpidem 2.5 to 5 mg nightly, lamotrigine 150 mg daily, duloxetine 120, buspirone 30 mg twice daily, alprazolam 0.25 mg twice daily as needed anxiety No SE now.  Sweating resolved. Less neuropathic  tingling and pain and no SE with duloxetine 120 mg daily. Had tough couples of mos with B died stroke at 61.  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  Odd behavior of birdseed in the house.  Had anxiety problems.   Sold d his house as is.  Been doing a lot.  Also grief.  It was house she grew up in.   Feels duloxetine helped her get threw this. Sisters help.  He had no money and she and H supported him. Doing ok overall.   Past Psychiatric Medication Trials:   Nortriptyline side effects, Trintellix loss of benefit,  duloxetine 60 helped neuropathy,  mirtazapine, venlafaxine side effects, sertraline, Pristiq, Viibryd, Wellbutrin,  Depakote, carbamazepine,  lamotrigine,  gabapentin feels better off it. Seroquel, Saphris, Geodon,  TMS helped,  trazodone no response,  buspirone, gabapentin  History of gastric bypass affects the absorption of some meds.   Review of Systems:  Review of Systems  Constitutional:  Positive for fatigue.  Cardiovascular:  Negative for chest pain and palpitations.  Gastrointestinal:  Negative for diarrhea.  Musculoskeletal:  Positive for arthralgias, back pain,  joint swelling and myalgias.  Neurological:  Positive for numbness. Negative for tremors.       Bruning neuropathy foot and legs   Psychiatric/Behavioral:  Positive for depression. Negative for dysphoric mood.   Sx worse with stress  Medications: I have reviewed the patient's current medications.  Current Outpatient Medications  Medication Sig Dispense Refill   busPIRone (BUSPAR) 30 MG tablet Take 1 tablet (30 mg total) by mouth 2 (two) times daily. 180 tablet 3   colestipol (COLESTID) 1 g tablet Take 1 g by mouth daily.   0   dicyclomine (BENTYL) 20 MG tablet Take 20 mg by mouth as needed for spasms.      lamoTRIgine (LAMICTAL) 150 MG tablet Take 1 tablet (150 mg total) by mouth daily. 90 tablet 3   simvastatin (ZOCOR) 40 MG tablet TK 1 T PO IN THE EVENING  4    ALPRAZolam (XANAX) 0.25 MG tablet TAKE 1 TABLET(0.25 MG) BY MOUTH TWICE DAILY AS NEEDED FOR ANXIETY 15 tablet 0   DULoxetine (CYMBALTA) 60 MG capsule Take 1 capsule (60 mg total) by mouth 2 (two) times daily. 180 capsule 1   zolpidem (AMBIEN) 5 MG tablet Take 1 tablet (5 mg total) by mouth at bedtime as needed for sleep. 30 tablet 5   No current facility-administered medications for this visit.    Medication Side Effects: None  Allergies:  Allergies  Allergen Reactions   Demeclocycline Nausea Only   Dilaudid [Hydromorphone Hcl] Itching   Fentanyl Itching   Morphine And Codeine Itching   Nsaids     Cannot take due to kidney disease   Percocet [Oxycodone-Acetaminophen]     itching   Tetracyclines & Related Nausea Only   Sulfa Antibiotics Rash    Past Medical History:  Diagnosis Date   Anxiety    Anxiety and depression 08/24/2011   Arthritis    Cancer (HCC)    colon   Depression    Hx of colon cancer, stage II 08/24/2011   T3N0  adenoca cecum resected 04/2004  Xeloda adjuvant chemo   Hyperlipidemia 08/24/2011   Joint pain    Nausea & vomiting    Neuropathy    PONV (postoperative nausea and vomiting)    Superficial basal cell carcinoma (BCC) 09/18/2012   Right Temple    Family History  Problem Relation Age of Onset   Diabetes Mother        type 1   Stroke Mother    Dementia Mother    Thyroid disease Mother    Alcohol abuse Mother    Asthma Mother    Neuropathy Mother    Alcohol abuse Father    Diabetes Father    Thyroid disease Sister    Psychiatric Illness Sister    Asthma Sister    Alcohol abuse Brother    Psychiatric Illness Brother    Cancer Maternal Uncle        throat   Cancer Paternal Aunt        germ cell   Cancer Maternal Grandmother        colon    Social History   Socioeconomic History   Marital status: Married    Spouse name: Molly Maduro   Number of children: 1   Years of education: 16   Highest education level: Not on file  Occupational  History   Occupation: Audiological scientist- Retired  Tobacco Use   Smoking status: Never   Smokeless tobacco: Never  Vaping Use   Vaping status: Never Used  Substance and Sexual Activity   Alcohol use: Yes    Comment: [place] once a week   Drug use: No   Sexual activity: Not on file  Other Topics Concern   Not on file  Social History Narrative   Lives with husband   Caffeine use: 2-3 cups daily   Right handed   Social Determinants of Health   Financial Resource Strain: Not on file  Food Insecurity: Not on file  Transportation Needs: Not on file  Physical Activity: Not on file  Stress: Not on file  Social Connections: Not on file  Intimate Partner Violence: Not on file    Past Medical History, Surgical history, Social history, and Family history were reviewed and updated as appropriate.   Please see review of systems for further details on the patient's review from today.   Objective:   Physical Exam:  There were no vitals taken for this visit.  Physical Exam Constitutional:      General: She is not in acute distress.    Appearance: She is well-developed.  Musculoskeletal:        General: No deformity.  Neurological:     Mental Status: She is alert and oriented to person, place, and time.     Motor: No tremor.     Coordination: Coordination normal.     Gait: Gait normal.  Psychiatric:        Attention and Perception: She does not perceive auditory hallucinations.        Mood and Affect: Mood is anxious. Mood is not depressed. Affect is not labile, blunt, angry or inappropriate.        Speech: Speech normal. Speech is not slurred.        Behavior: Behavior normal.        Thought Content: Thought content normal. Thought content is not delusional. Thought content does not include homicidal or suicidal ideation. Thought content does not include suicidal plan.        Cognition and Memory: She exhibits impaired recent memory.        Judgment: Judgment normal.     Comments:  Insight intact. No auditory or visual hallucinations.  Somatic sx better with more duloxetine     Lab Review:     Component Value Date/Time   NA 140 02/02/2017 1554   NA 142 08/21/2013 1445   K 5.2 02/02/2017 1554   K 4.1 08/21/2013 1445   CL 102 02/02/2017 1554   CO2 24 02/02/2017 1554   CO2 26 08/21/2013 1445   GLUCOSE 97 02/02/2017 1554   GLUCOSE 120 (H) 02/07/2014 1107   GLUCOSE 91 08/21/2013 1445   BUN 19 02/02/2017 1554   BUN 18.7 08/21/2013 1445   CREATININE 1.17 (H) 02/02/2017 1554   CREATININE 1.1 08/21/2013 1445   CALCIUM 9.3 02/02/2017 1554   CALCIUM 9.1 08/21/2013 1445   PROT 6.6 03/03/2016 1124   PROT 6.5 08/21/2013 1445   ALBUMIN 4.7 02/07/2014 1107   ALBUMIN 3.7 08/21/2013 1445   AST 21 02/07/2014 1107   AST 12 08/21/2013 1445   ALT 14 02/07/2014 1107   ALT 11 08/21/2013 1445   ALKPHOS 64 02/07/2014 1107   ALKPHOS 56 08/21/2013 1445   BILITOT 0.6 02/07/2014 1107   BILITOT 0.27 08/21/2013 1445   GFRNONAA 50 (L) 02/02/2017 1554   GFRAA 57 (L) 02/02/2017 1554       Component Value Date/Time   WBC 6.3 02/07/2014 1107   RBC 4.28 02/07/2014 1107   HGB 12.9  02/07/2014 1107   HGB 12.3 08/21/2013 1445   HCT 38.7 02/07/2014 1107   HCT 37.2 08/21/2013 1445   PLT 253 02/07/2014 1107   PLT 226 08/21/2013 1445   MCV 90.4 02/07/2014 1107   MCV 93.2 08/21/2013 1445   MCH 30.1 02/07/2014 1107   MCHC 33.3 02/07/2014 1107   RDW 13.2 02/07/2014 1107   RDW 13.8 08/21/2013 1445   LYMPHSABS 1.5 08/21/2013 1445   MONOABS 0.3 08/21/2013 1445   EOSABS 0.1 08/21/2013 1445   BASOSABS 0.0 08/21/2013 1445    No results found for: "POCLITH", "LITHIUM"   No results found for: "PHENYTOIN", "PHENOBARB", "VALPROATE", "CBMZ"   .res Assessment: Plan:    Evoleht was seen today for follow-up.  Diagnoses and all orders for this visit:  Major depressive disorder, recurrent episode, moderate (HCC)  Generalized anxiety disorder -     ALPRAZolam (XANAX) 0.25 MG tablet;  TAKE 1 TABLET(0.25 MG) BY MOUTH TWICE DAILY AS NEEDED FOR ANXIETY  PTSD (post-traumatic stress disorder) -     ALPRAZolam (XANAX) 0.25 MG tablet; TAKE 1 TABLET(0.25 MG) BY MOUTH TWICE DAILY AS NEEDED FOR ANXIETY  Small fiber neuropathy -     DULoxetine (CYMBALTA) 60 MG capsule; Take 1 capsule (60 mg total) by mouth 2 (two) times daily.  Insomnia due to mental condition -     zolpidem (AMBIEN) 5 MG tablet; Take 1 tablet (5 mg total) by mouth at bedtime as needed for sleep.    30 min face to face time with patient was spent on counseling and coordination of care. We discussed  As can be seen the patient is failed a long list of psychiatric medications.  Fortunately she did respond to TMS for depression.  Cont meds that are working and tolerating well.  Maintained the TMS benefit for depression.  .    Better with increase duloxetine 60 mg BID both modd and neuropathy.  Mood is stable. Overall Continue buspirone 30 BID until legal matters with B over then can try taper if she wants.  Since benefit with duloxtine 120 mg daily  Continue Cerefolin-NAC which she took in the past for MCI. She feels it's helpful and wants to continue it.  Has continued telehealth with therapist Lorraine Cahill PHD. And it's helpful.  Because of continued neurocognitive complaints with a history of diagnosis of mild cognitive impairment by neuropsych testing we rec lithium but she has stigma about it.  continue lamotrigine to 150 mg daily.  I would not advise further dose reduction.    Lamotrigine is being used to reduce risk of recurrent depression given multiple med failures as noted above. Answered questions on need to continue longterm bc high relapse risk.  Her sleep is managed but we also discussed the potential that zolpidem is interfering with her memory.  Consider alternatives.  Disc difference with generics. Needs ambien to shut down brain at night. OK zolpidem 2.5-5 mg HS.  No SE  This appt was 30  mins.  FU 6 mos.  Meredith Staggers, MD, DFAPA   Please see After Visit Summary for patient specific instructions.  No future appointments.    No orders of the defined types were placed in this encounter.      -------------------------------

## 2022-11-02 DIAGNOSIS — F411 Generalized anxiety disorder: Secondary | ICD-10-CM | POA: Diagnosis not present

## 2022-11-16 DIAGNOSIS — F411 Generalized anxiety disorder: Secondary | ICD-10-CM | POA: Diagnosis not present

## 2022-12-14 DIAGNOSIS — F411 Generalized anxiety disorder: Secondary | ICD-10-CM | POA: Diagnosis not present

## 2022-12-16 DIAGNOSIS — E538 Deficiency of other specified B group vitamins: Secondary | ICD-10-CM | POA: Diagnosis not present

## 2022-12-16 DIAGNOSIS — R0789 Other chest pain: Secondary | ICD-10-CM | POA: Diagnosis not present

## 2022-12-21 ENCOUNTER — Other Ambulatory Visit: Payer: Self-pay | Admitting: Family Medicine

## 2022-12-21 DIAGNOSIS — Z1231 Encounter for screening mammogram for malignant neoplasm of breast: Secondary | ICD-10-CM

## 2022-12-22 ENCOUNTER — Ambulatory Visit
Admission: RE | Admit: 2022-12-22 | Discharge: 2022-12-22 | Disposition: A | Discharge status: Home / Self Care | Payer: BC Managed Care – PPO | Source: Ambulatory Visit | Attending: Family Medicine | Admitting: Family Medicine

## 2022-12-22 DIAGNOSIS — Z1231 Encounter for screening mammogram for malignant neoplasm of breast: Secondary | ICD-10-CM

## 2022-12-24 DIAGNOSIS — F411 Generalized anxiety disorder: Secondary | ICD-10-CM | POA: Diagnosis not present

## 2023-01-11 ENCOUNTER — Other Ambulatory Visit: Payer: Self-pay | Admitting: Psychiatry

## 2023-01-11 DIAGNOSIS — F411 Generalized anxiety disorder: Secondary | ICD-10-CM

## 2023-01-11 DIAGNOSIS — F4310 Post-traumatic stress disorder, unspecified: Secondary | ICD-10-CM

## 2023-01-12 NOTE — Telephone Encounter (Signed)
LF 09/10; LV 09/10

## 2023-02-09 NOTE — Progress Notes (Signed)
 Cardiology Office Note:  .   Date:  02/11/2023  ID:  Lorraine May, DOB 1955-05-13, MRN [id number] PCP: Dyane Anthony RAMAN, FNP  [facility] Providers Cardiologist:  None {  History of Present Illness: .   Lorraine May is a 68 y.o. female with history of CKD who presents for the evaluation of chest pain at the request of Dyane Anthony RAMAN, FNP.   History of Present Illness   Lorraine May, a 68 year old with a history of CKD stage IIIA, presents for evaluation of chest pain. The patient describes an episode in mid-November where she experienced severe jaw pain, pain in her shoulder blades, and a sensation of a band tightening underneath her breasts. The episode lasted for about 8 minutes and occurred while she was sitting and reading. The patient reports no exacerbating factors and the pain remained constant throughout the episode. She has had similar episodes in the past, but they are infrequent and not exertional. The patient does not exercise regularly and there were no unusual circumstances on the day of the episode in November. She has a history of chemotherapy-related kidney disease and small fiber peripheral neuropathy, also from chemotherapy. She does not have high blood pressure or diabetes and has never had a heart attack or stroke. The patient has had a hysterectomy, hernia surgery, and knee surgeries. She was treated for stage two colon cancer with chemotherapy. Her brother recently passed away from cerebrovascular disease and was a heavy smoker. The patient does not smoke and drinks alcohol socially. She is retired from her job as an airline pilot. She takes simvastatin  for cholesterol management.          Problem List CKD 3a Depression HLD -T chol 246, HDL 95, LDL 120, TG 187    ROS: All other ROS reviewed and negative. Pertinent positives noted in the HPI.     Studies Reviewed: SABRA   EKG Interpretation Date/Time:  Friday February 11 2023 08:11:34 EST Ventricular Rate:  86 PR  Interval:  134 QRS Duration:  82 QT Interval:  370 QTC Calculation: 442 R Axis:   55  Text Interpretation: Normal sinus rhythm Nonspecific ST abnormality Confirmed by Barbaraann Kotyk [id number] on 02/11/2023 8:13:17 AM   Physical Exam:   VS:  BP 116/84 (BP Location: Left Arm, Patient Position: Sitting, Cuff Size: Normal)   Pulse 86   Ht 5' 4 (1.626 m)   Wt 163 lb 9.6 oz (74.2 kg)   SpO2 98%   BMI 28.08 kg/m    Wt Readings from Last 3 Encounters:  02/11/23 163 lb 9.6 oz (74.2 kg)  04/01/21 175 lb (79.4 kg)  09/29/20 186 lb (84.4 kg)    GEN: Well nourished, well developed in no acute distress NECK: No JVD; No carotid bruits CARDIAC: RRR, no murmurs, rubs, gallops RESPIRATORY:  Clear to auscultation without rales, wheezing or rhonchi  ABDOMEN: Soft, non-tender, non-distended EXTREMITIES:  No edema; No deformity  ASSESSMENT AND PLAN: .   Assessment and Plan    Chest Pain, possible cardiac  Infrequent episodes of chest discomfort, jaw pain, and tightness under the breasts, lasting approximately 8 minutes. Non-exertional and not alleviated by rest. Last episode occurred in November.  -EKG shows nonspecific STT changes. -cannot exercise on treadmill. BMI 28 (overweight).  -I have recommended cardiac PET due to unreliability of SPECT stress testing  -Order cardiac PET stress test to evaluate for potential cardiac etiology.  Chronic Kidney Disease (CKD) Stage 3A Stable kidney function with an estimated  GFR of 59. -Continue current management.  Follow-up Based on the results of the cardiac PET stress test.          Informed Consent   Shared Decision Making/Informed Consent The risks [chest pain, shortness of breath, cardiac arrhythmias, dizziness, blood pressure fluctuations, myocardial infarction, stroke/transient ischemic attack, nausea, vomiting, allergic reaction, radiation exposure, metallic taste sensation and life-threatening complications (estimated to be 1 in 10,000)],  benefits (risk stratification, diagnosing coronary artery disease, treatment guidance) and alternatives of a cardiac PET stress test were discussed in detail with Lorraine May and she agrees to proceed.      Follow-up: Return if symptoms worsen or fail to improve.  Signed, Darryle DASEN. Barbaraann, MD, [facility]  [facility]  [address] [place], [place] [address] [phone]  8:27 AM

## 2023-02-11 ENCOUNTER — Encounter: Payer: Self-pay | Admitting: Cardiovascular Disease

## 2023-02-11 ENCOUNTER — Ambulatory Visit: Payer: BC Managed Care – PPO | Attending: Cardiovascular Disease | Admitting: Cardiovascular Disease

## 2023-02-11 VITALS — BP 116/84 | HR 86 | Ht 64.0 in | Wt 163.6 lb

## 2023-02-11 DIAGNOSIS — R9431 Abnormal electrocardiogram [ECG] [EKG]: Secondary | ICD-10-CM

## 2023-02-11 DIAGNOSIS — E663 Overweight: Secondary | ICD-10-CM | POA: Diagnosis not present

## 2023-02-11 DIAGNOSIS — N1831 Chronic kidney disease, stage 3a: Secondary | ICD-10-CM

## 2023-02-11 DIAGNOSIS — R072 Precordial pain: Secondary | ICD-10-CM | POA: Diagnosis not present

## 2023-02-11 NOTE — Patient Instructions (Signed)
 Medication Instructions:  Your physician recommends that you continue on your current medications as directed. Please refer to the Current Medication list given to you today.    *If you need a refill on your cardiac medications before your next appointment, please call your pharmacy*   Lab Work: None    If you have labs (blood work) drawn today and your tests are completely normal, you will receive your results only by: MyChart Message (if you have MyChart) OR A paper copy in the mail If you have any lab test that is abnormal or we need to change your treatment, we will call you to review the results.   Testing/Procedures:    Please report to Radiology at the [facility] Main Entrance 30 minutes early for your test.  [address] [place], [place] [address]   How to Prepare for Your Cardiac PET/CT Stress Test:  Nothing to eat or drink, except water, 3 hours prior to arrival time.  NO caffeine/decaffeinated products, or chocolate 12 hours prior to arrival. (Please note decaffeinated beverages (teas/coffees) still contain caffeine).  If you have caffeine within 12 hours prior, the test will need to be rescheduled.  Medication instructions: Do not take erectile dysfunction medications for 72 hours prior to test (sildenafil, tadalafil) Do not take nitrates (isosorbide mononitrate, Ranexa) the day before or day of test Do not take tamsulosin the day before or morning of test Hold theophylline containing medications for 12 hours. Hold Dipyridamole 48 hours prior to the test.  Diabetic Preparation: If able to eat breakfast prior to 3 hour fasting, you may take all medications, including your insulin. Do not worry if you miss your breakfast dose of insulin - start at your next meal. If you do not eat prior to 3 hour fast-Hold all diabetes (oral and insulin) medications. Patients who wear a continuous glucose monitor MUST remove the device prior to scanning.  You may  take your remaining medications with water.  NO perfume, cologne or lotion on chest or abdomen area. FEMALES - Please avoid wearing dresses to this appointment.  Total time is 1 to 2 hours; you may want to bring reading material for the waiting time.  IF YOU THINK YOU MAY BE PREGNANT, OR ARE NURSING PLEASE INFORM THE TECHNOLOGIST.  In preparation for your appointment, medication and supplies will be purchased.  Appointment availability is limited, so if you need to cancel or reschedule, please call the Radiology Department at [phone] Ger[facility] Law) OR [phone] [facility]) 24 hours in advance to avoid a cancellation fee of $100.00  What to Expect When you Arrive:  Once you arrive and check in for your appointment, you will be taken to a preparation room within the Radiology Department.  A technologist or Nurse will obtain your medical history, verify that you are correctly prepped for the exam, and explain the procedure.  Afterwards, an IV will be started in your arm and electrodes will be placed on your skin for EKG monitoring during the stress portion of the exam. Then you will be escorted to the PET/CT scanner.  There, staff will get you positioned on the scanner and obtain a blood pressure and EKG.  During the exam, you will continue to be connected to the EKG and blood pressure machines.  A small, safe amount of a radioactive tracer will be injected in your IV to obtain a series of pictures of your heart along with an injection of a stress agent.    After  your Exam:  It is recommended that you eat a meal and drink a caffeinated beverage to counter act any effects of the stress agent.  Drink plenty of fluids for the remainder of the day and urinate frequently for the first couple of hours after the exam.  Your doctor will inform you of your test results within 7-10 business days.  For more information and frequently asked questions, please visit our  website: [email or web address]  For questions about your test or how to prepare for your test, please call: Cardiac Imaging Nurse Navigators Office: [phone]    Follow-Up: At [facility], you and your health needs are our priority.  As part of our continuing mission to provide you with exceptional heart care, we have created designated Provider Care Teams.  These Care Teams include your primary Cardiologist (physician) and Advanced Practice Providers (APPs -  Physician Assistants and Nurse Practitioners) who all work together to provide you with the care you need, when you need it.  We recommend signing up for the patient portal called MyChart.  Sign up information is provided on this After Visit Summary.  MyChart is used to connect with patients for Virtual Visits (Telemedicine).  Patients are able to view lab/test results, encounter notes, upcoming appointments, etc.  Non-urgent messages can be sent to your provider as well.   To learn more about what you can do with MyChart, go to [email or web address].    Your next appointment:   Follow up as needed pending results of cardiac testing   Provider:   Darryle Decent, MD    Other Instructions

## 2023-02-22 ENCOUNTER — Telehealth (HOSPITAL_COMMUNITY): Payer: Self-pay | Admitting: *Deleted

## 2023-02-22 NOTE — Telephone Encounter (Signed)
Reaching out to patient to offer assistance regarding upcoming cardiac imaging study; pt verbalizes understanding of appt date/time, parking situation and where to check in, pre-test NPO status, and verified current allergies; name and call back number provided for further questions should they arise  Aailyah Dunbar RN Navigator Cardiac Imaging [facility] [phone] office [phone] cell  Patient aware to avoid caffeine 12 hours prior to her cardiac PET scan.  

## 2023-02-23 ENCOUNTER — Ambulatory Visit (HOSPITAL_COMMUNITY)
Admission: RE | Admit: 2023-02-23 | Discharge: 2023-02-23 | Disposition: A | Discharge status: Home / Self Care | Payer: BC Managed Care – PPO | Source: Ambulatory Visit | Attending: Cardiovascular Disease | Admitting: Cardiovascular Disease

## 2023-02-23 ENCOUNTER — Encounter: Payer: Self-pay | Admitting: Cardiovascular Disease

## 2023-02-23 DIAGNOSIS — R072 Precordial pain: Secondary | ICD-10-CM | POA: Diagnosis not present

## 2023-02-23 LAB — NM PET CT CARDIAC PERFUSION MULTI W/ABSOLUTE BLOODFLOW
MBFR: 2.77
Nuc Rest EF: 70 %
Nuc Stress EF: 70 %
Rest MBF: 1.09 ml/g/min
Rest Nuclear Isotope Dose: 19.1 mCi
ST Depression (mm): 0 mm
Stress MBF: 3.02 ml/g/min
Stress Nuclear Isotope Dose: 19.2 mCi

## 2023-02-23 MED ORDER — REGADENOSON 0.4 MG/5ML IV SOLN
INTRAVENOUS | Status: AC
  Filled 2023-02-23: qty 5

## 2023-02-23 MED ORDER — RUBIDIUM RB82 GENERATOR (RUBYFILL)
19.1300 | PACK | Freq: Once | INTRAVENOUS | Status: AC
  Administered 2023-02-23: 19.13 via INTRAVENOUS

## 2023-02-23 MED ORDER — REGADENOSON 0.4 MG/5ML IV SOLN
0.4000 mg | Freq: Once | INTRAVENOUS | Status: AC
  Administered 2023-02-23: 0.4 mg via INTRAVENOUS

## 2023-02-23 MED ORDER — RUBIDIUM RB82 GENERATOR (RUBYFILL)
19.2400 | PACK | Freq: Once | INTRAVENOUS | Status: AC
  Administered 2023-02-23: 19.24 via INTRAVENOUS

## 2023-02-28 ENCOUNTER — Other Ambulatory Visit: Payer: Self-pay

## 2023-02-28 DIAGNOSIS — Z85038 Personal history of other malignant neoplasm of large intestine: Secondary | ICD-10-CM | POA: Diagnosis not present

## 2023-02-28 DIAGNOSIS — K529 Noninfective gastroenteritis and colitis, unspecified: Secondary | ICD-10-CM | POA: Diagnosis not present

## 2023-02-28 DIAGNOSIS — K915 Postcholecystectomy syndrome: Secondary | ICD-10-CM | POA: Diagnosis not present

## 2023-02-28 MED ORDER — ATORVASTATIN CALCIUM 20 MG PO TABS: 20.0000 mg | ORAL_TABLET | Freq: Every day | ORAL | 4 refills | Status: DC

## 2023-04-12 ENCOUNTER — Encounter: Payer: Self-pay | Admitting: Psychiatry

## 2023-04-12 ENCOUNTER — Ambulatory Visit (INDEPENDENT_AMBULATORY_CARE_PROVIDER_SITE_OTHER): Payer: BC Managed Care – PPO | Admitting: Psychiatry

## 2023-04-12 DIAGNOSIS — G3184 Mild cognitive impairment, so stated: Secondary | ICD-10-CM

## 2023-04-12 DIAGNOSIS — F411 Generalized anxiety disorder: Secondary | ICD-10-CM | POA: Diagnosis not present

## 2023-04-12 DIAGNOSIS — F5105 Insomnia due to other mental disorder: Secondary | ICD-10-CM

## 2023-04-12 DIAGNOSIS — F4310 Post-traumatic stress disorder, unspecified: Secondary | ICD-10-CM | POA: Diagnosis not present

## 2023-04-12 DIAGNOSIS — G629 Polyneuropathy, unspecified: Secondary | ICD-10-CM

## 2023-04-12 DIAGNOSIS — F331 Major depressive disorder, recurrent, moderate: Secondary | ICD-10-CM

## 2023-04-12 MED ORDER — LAMOTRIGINE 150 MG PO TABS
150.0000 mg | ORAL_TABLET | Freq: Every day | ORAL | 3 refills | Status: AC
Start: 2023-04-12 — End: ?

## 2023-04-12 MED ORDER — ZOLPIDEM TARTRATE 5 MG PO TABS: 5.0000 mg | ORAL_TABLET | Freq: Every evening | ORAL | 5 refills | Status: DC | PRN

## 2023-04-12 MED ORDER — DULOXETINE HCL 60 MG PO CPEP: 60.0000 mg | ORAL_CAPSULE | Freq: Two times a day (BID) | ORAL | 1 refills | Status: DC

## 2023-04-12 NOTE — Progress Notes (Signed)
 Jaziya Obarr Pinales [id number] 08-10-1955 68 y.o.  Subjective:   Patient ID:  Lorraine May is a 68 y.o. (DOB [birth year]) female.  Chief Complaint:  Chief Complaint  Patient presents with   Follow-up    Austina Constantin Pugh presents to the office today for follow-up of Hx TRD with benefit from TMS 2016-8 at [facility].   Verty effective.  seen June 2020.  The following changes were made: OK trial reduction lamotrigine to 1/2 tablet in the morning and 1 in PM to see if memory is better. Discussed the risk of relapse and if it occurs to increase the medication back to the full dosage. NAC N-acetylcysteine 600 mg daily.  Off label for cognitive complaints including word-finding and STM issues.  Jan 2021 appt with the following noted: No changes with reduction in lamotrigine in cognition.  Added NAC.  Asks about Cerefolin NAC again.   Forgets details in conversation. STM px.  Past history of neuropsych testing which dx MCI.  Decided not to move again and that's a good thing.  Had wondered about reducing the meds but Covid and civil unrest and politics disturbing.  Disturbing over the depth of hatred in the country.  Telehealth is bothersome.  Concerned memory is not as good as in the past and wonders if related to meds. No sig Xanax. Patient reports stable mood and denies depressed or irritable moods.   Patient denies difficulty with sleep initiation or maintenance.  Sleep fairly well with gabapentin and naltrexone. No med changes.  08/29/19 appt with the following noted: Vaccinated. Donepezil 5 without benefit and caused diarrhea so stopped. Stopped naltrexone bc decided it didn't help neuropathy. Patient reports mild depressed and worry over Covid.  Patient denies difficulty with sleep initiation or maintenance. Denies appetite disturbance.  Patient reports that energy and motivation have been good.  Patient denies any difficulty with concentration.  Patient denies any suicidal ideation. Wonders about  restarting naltrexone at low dose by [employer].bc it did help sleep some.  Asks I RX it. Having to use Ambien now bc foot problems. Has continued [name] phd. Recognizes some avoidance. Plan: Wrote prescription for patient to resume low-dose naltrexone which she had previously taken per her request  03/05/2020 appointment with the following noted: Xanax prn occ. 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  It was hard.  Moved 2 mos ago. Anxiety inducing from niece with mental health problems including paranoid delusional.  Not in consistent treatment.    Not sig depressed.  Pandemic wearing. Switched lamotrigine to 150 pm and feels knocked out in the evening. Plan: switch lamotrigine to 150 mg 1/2 tab BID bc sleepiness in evening.  I would not advise further dose reduction.   Cerefolin NAC  09/02/2020 appointment with the following noted: Did not split lamotrigine but switched it to the morning and is not so sleepy. Hardly ever used Xanax. Taking zolpidem more for sleep than she had DT stressors and mind starts racing. Used to take 10 mg zolpidem.   Gabapentin for neuropathy with SE memory and wonders about duloxetine. B alcoholic and DUI again.  Causing stress on the family. Limits news DT anxiety. Patient reports stable mood and denies depressed or irritable moods.  Patient residual anxiety.  Patient denies difficulty with sleep initiation or maintenance. Denies appetite disturbance.  Patient reports that energy and motivation have been good.  Patient denies any difficulty with concentration.  Patient denies any suicidal ideation.  03/09/2021 appt noted: Neuro Dr. Daisy Blossom started duloxetine  and sees benefit.  Feet aren't burning now.  Tingling better and  neuropathy much better.  Increased to duloxetine 60 mg tomorrow. Able to wean off the gabapentin. Feels better about meds. H's parent's needing care and stressful.  H stressed.  Her parents deceased.   Not markedly depressed.  Some  anxiety over it. Taking Ambien again for sleep.   Just bought a place at [facility].  10/08/21 appt noted: Covid early August. From H mild. First time. Stress B threatens suicide but she thinks it's manipulative.  Not doing well in retirement.  M had taken care of him and now M died. Poor social development of B.  Sister cares for him.   May have to sell her beach house. 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 Not watching news bc too anxious.   Started taking Ambien again bc of stress and occ 1/2 Xanax. Duloxetine 60 from neuro helped neuropathy which caused pain.. Asks me to renew it and wants to try higher dose for partial response. Maybe feels a little more leveled out emotionally and less catastrophic thinking. Still on Buspar, lamotrigine, Ambie, prn Xanax. Tolerating meds. Good finances. But has had to help family members with money.  04/06/22 appt noted: Couldn't increase duloxetine 90  bc insur wouldn't cover.  She would like to try for Neuropathy.  Gets some relief and more than gabapentin with it. On 60 mg now without SE On buspirone 30 BID, lamotrigine 150 daily, Ambien 5, alprazolam 0.25 mg rarely. No SE. Some anxiety but not depressed. Sleep well with Ambien. Plan: Agree with increase duloxetine 90 mg daily and disc SE in answer to questions bc partial response.for burning neuropathy.  This didn't happen at last appt.  Call if tolerated and NR and can increase to max 60 mg BID  07/23/22 incr duloxetine 120 mg for neuropathy  10/12/22 appt noted; Psych med: Zolpidem 2.5 to 5 mg nightly, lamotrigine 150 mg daily, duloxetine 120, buspirone 30 mg twice daily, alprazolam 0.25 mg twice daily as needed anxiety No SE now.  Sweating resolved. Less neuropathic tingling and pain and no SE with duloxetine 120 mg daily. Had tough couples of mos with B died stroke at 50.  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   Odd behavior of birdseed in the house.  Had anxiety problems.   Sold d his house as is.  Been doing a lot.  Also grief.  It was house she grew up in.   Feels duloxetine helped her get threw this. Sisters help.  He had no money and she and H supported him. Doing ok overall.  04/12/23 appt noted: Psych med: Zolpidem 2.5 to 5 mg nightly, lamotrigine 150 mg daily, duloxetine 120, buspirone 30 mg twice daily, alprazolam 0.25 mg twice daily as needed anxiety No SE now.  Sweating resolved. Her therapist [name] has retired but pt feels ok. Handled B's estate and things leveling out.   Wonders about reducing some meds.  Dulox helps mood and neuropathy. Dep and anxiety managed.  Duloxetine helps neuropathy.     Past Psychiatric Medication Trials:   Nortriptyline side effects, Trintellix loss of benefit,  duloxetine 60 helped neuropathy,  mirtazapine, venlafaxine side effects, sertraline, Pristiq, Viibryd, Wellbutrin,  Depakote, carbamazepine,  lamotrigine,  gabapentin feels better off it. Seroquel, Saphris, Geodon,  TMS helped,  trazodone no response,  buspirone, gabapentin  History of gastric bypass affects  the absorption of some meds.   Review of Systems:  Review of Systems  Constitutional:  Positive for fatigue.  Cardiovascular:  Negative for chest pain and palpitations.  Gastrointestinal:  Negative for diarrhea.  Musculoskeletal:  Positive for arthralgias, back pain, joint swelling and myalgias.  Neurological:  Positive for numbness. Negative for tremors.       Bruning neuropathy foot and legs   Psychiatric/Behavioral:  Negative for dysphoric mood.   Sx worse with stress  Medications: I have reviewed the patient's current medications.  Current Outpatient Medications  Medication Sig Dispense Refill   ALPRAZolam (XANAX) 0.25 MG tablet TAKE 1 TABLET(0.25 MG) BY MOUTH TWICE DAILY AS NEEDED FOR ANXIETY 15 tablet 2   atorvastatin (LIPITOR) 20 MG tablet Take 1 tablet (20 mg total)  by mouth daily. 90 tablet 4   busPIRone (BUSPAR) 30 MG tablet Take 1 tablet (30 mg total) by mouth 2 (two) times daily. 180 tablet 3   colestipol (COLESTID) 1 g tablet Take 1 g by mouth daily.   0   dicyclomine (BENTYL) 20 MG tablet Take 20 mg by mouth as needed for spasms.      DULoxetine (CYMBALTA) 60 MG capsule Take 1 capsule (60 mg total) by mouth 2 (two) times daily. 180 capsule 1   lamoTRIgine (LAMICTAL) 150 MG tablet Take 1 tablet (150 mg total) by mouth daily. 90 tablet 3   zolpidem (AMBIEN) 5 MG tablet Take 1 tablet (5 mg total) by mouth at bedtime as needed for sleep. 30 tablet 5   No current facility-administered medications for this visit.    Medication Side Effects: None  Allergies:  Allergies  Allergen Reactions   Demeclocycline Nausea Only   Dilaudid [Hydromorphone Hcl] Itching   Fentanyl Itching   Morphine And Codeine Itching   Nsaids     Cannot take due to kidney disease   Percocet [Oxycodone-Acetaminophen]     itching   Tetracyclines & Related Nausea Only   Sulfa Antibiotics Rash    Past Medical History:  Diagnosis Date   Anxiety    Anxiety and depression 08/24/2011   Arthritis    Cancer (HCC)    colon   Chronic kidney disease    Depression    Hx of colon cancer, stage II 08/24/2011   T3N0  adenoca cecum resected 04/2004  Xeloda adjuvant chemo   Hyperlipidemia 08/24/2011   Joint pain    Nausea & vomiting    Neuropathy    PONV (postoperative nausea and vomiting)    Superficial basal cell carcinoma (BCC) 09/18/2012   Right Temple    Family History  Problem Relation Age of Onset   Diabetes Mother        type 1   Stroke Mother    Dementia Mother    Thyroid disease Mother    Alcohol abuse Mother    Asthma Mother    Neuropathy Mother    Alcohol abuse Father    Diabetes Father    Thyroid disease Sister    Psychiatric Illness Sister    Asthma Sister    Alcohol abuse Brother    Psychiatric Illness Brother    Cancer Maternal Uncle        throat    Cancer Paternal Aunt        germ cell   Cancer Maternal Grandmother        colon    Social History   Socioeconomic History   Marital status: Married    Spouse name: Molly Maduro  Number of children: 1   Years of education: 16   Highest education level: Not on file  Occupational History   Occupation: Audiological scientist- Retired  Tobacco Use   Smoking status: Never   Smokeless tobacco: Never  Vaping Use   Vaping status: Never Used  Substance and Sexual Activity   Alcohol use: Yes    Comment: [place] once a week   Drug use: No   Sexual activity: Not on file  Other Topics Concern   Not on file  Social History Narrative   Lives with husband   Caffeine use: 2-3 cups daily   Right handed   Social Drivers of Corporate investment banker Strain: Not on file  Food Insecurity: Not on file  Transportation Needs: Not on file  Physical Activity: Not on file  Stress: Not on file  Social Connections: Not on file  Intimate Partner Violence: Not on file    Past Medical History, Surgical history, Social history, and Family history were reviewed and updated as appropriate.   Please see review of systems for further details on the patient's review from today.   Objective:   Physical Exam:  There were no vitals taken for this visit.  Physical Exam Constitutional:      General: She is not in acute distress.    Appearance: She is well-developed.  Musculoskeletal:        General: No deformity.  Neurological:     Mental Status: She is alert and oriented to person, place, and time.     Motor: No tremor.     Coordination: Coordination normal.     Gait: Gait normal.  Psychiatric:        Attention and Perception: She does not perceive auditory hallucinations.        Mood and Affect: Mood is anxious. Mood is not depressed. Affect is not labile, blunt, angry or inappropriate.        Speech: Speech normal. Speech is not slurred.        Behavior: Behavior normal.        Thought Content:  Thought content normal. Thought content is not delusional. Thought content does not include homicidal or suicidal ideation. Thought content does not include suicidal plan.        Cognition and Memory: She exhibits impaired recent memory.        Judgment: Judgment normal.     Comments: Insight intact. No auditory or visual hallucinations.  Somatic sx better with more duloxetine     Lab Review:     Component Value Date/Time   NA 140 02/02/2017 1554   NA 142 08/21/2013 1445   K 5.2 02/02/2017 1554   K 4.1 08/21/2013 1445   CL 102 02/02/2017 1554   CO2 24 02/02/2017 1554   CO2 26 08/21/2013 1445   GLUCOSE 97 02/02/2017 1554   GLUCOSE 120 (H) 02/07/2014 1107   GLUCOSE 91 08/21/2013 1445   BUN 19 02/02/2017 1554   BUN 18.7 08/21/2013 1445   CREATININE 1.17 (H) 02/02/2017 1554   CREATININE 1.1 08/21/2013 1445   CALCIUM 9.3 02/02/2017 1554   CALCIUM 9.1 08/21/2013 1445   PROT 6.6 03/03/2016 1124   PROT 6.5 08/21/2013 1445   ALBUMIN 4.7 02/07/2014 1107   ALBUMIN 3.7 08/21/2013 1445   AST 21 02/07/2014 1107   AST 12 08/21/2013 1445   ALT 14 02/07/2014 1107   ALT 11 08/21/2013 1445   ALKPHOS 64 02/07/2014 1107   ALKPHOS 56 08/21/2013 1445  BILITOT 0.6 02/07/2014 1107   BILITOT 0.27 08/21/2013 1445   GFRNONAA 50 (L) 02/02/2017 1554   GFRAA 57 (L) 02/02/2017 1554       Component Value Date/Time   WBC 6.3 02/07/2014 1107   RBC 4.28 02/07/2014 1107   HGB 12.9 02/07/2014 1107   HGB 12.3 08/21/2013 1445   HCT 38.7 02/07/2014 1107   HCT 37.2 08/21/2013 1445   PLT 253 02/07/2014 1107   PLT 226 08/21/2013 1445   MCV 90.4 02/07/2014 1107   MCV 93.2 08/21/2013 1445   MCH 30.1 02/07/2014 1107   MCHC 33.3 02/07/2014 1107   RDW 13.2 02/07/2014 1107   RDW 13.8 08/21/2013 1445   LYMPHSABS 1.5 08/21/2013 1445   MONOABS 0.3 08/21/2013 1445   EOSABS 0.1 08/21/2013 1445   BASOSABS 0.0 08/21/2013 1445    No results found for: "POCLITH", "LITHIUM"   No results found for:  "PHENYTOIN", "PHENOBARB", "VALPROATE", "CBMZ"   .res Assessment: Plan:    Icis was seen today for follow-up.  Diagnoses and all orders for this visit:  Major depressive disorder, recurrent episode, moderate (HCC) -     lamoTRIgine (LAMICTAL) 150 MG tablet; Take 1 tablet (150 mg total) by mouth daily.  Generalized anxiety disorder  PTSD (post-traumatic stress disorder)  Small fiber neuropathy -     DULoxetine (CYMBALTA) 60 MG capsule; Take 1 capsule (60 mg total) by mouth 2 (two) times daily.  Insomnia due to mental condition -     zolpidem (AMBIEN) 5 MG tablet; Take 1 tablet (5 mg total) by mouth at bedtime as needed for sleep.  Mild cognitive impairment   30 min face to face time with patient was spent on counseling and coordination of care. We discussed  As can be seen the patient is failed a long list of psychiatric medications.  Fortunately she did respond to TMS for depression.  Cont meds that are working and tolerating well.  Maintained the TMS benefit for depression.  .    Better with increase duloxetine 60 mg BID both modd and neuropathy.  Mood is stable. Overall taper buspirone 30 BID  try taper if she wants.  Since benefit with duloxtine 120 mg daily  Continue Cerefolin-NAC which she took in the past for MCI. She feels it's helpful and wants to continue it.  Has continued telehealth with therapist [name] PHD. And it's helpful.  Because of continued neurocognitive complaints with a history of diagnosis of mild cognitive impairment by neuropsych testing we rec lithium but she has stigma about it.  continue lamotrigine to 150 mg daily.  I would not advise further dose reduction.    Lamotrigine is being used to reduce risk of recurrent depression given multiple med failures as noted above. Answered questions on need to continue longterm bc high relapse risk.  Her sleep is managed but we also discussed the potential that zolpidem is interfering with her memory.   Consider alternatives.  Disc difference with generics. Needs ambien to shut down brain at night.  If taper go 1/4 per month. OK zolpidem 2.5-5 mg HS.  No SE  Rare Xanax.  Not much stress.   FU 6 mos.  Meredith Staggers, MD, DFAPA   Please see After Visit Summary for patient specific instructions.  No future appointments.    No orders of the defined types were placed in this encounter.      -------------------------------

## 2023-04-19 DIAGNOSIS — E538 Deficiency of other specified B group vitamins: Secondary | ICD-10-CM | POA: Diagnosis not present

## 2023-04-19 DIAGNOSIS — E7800 Pure hypercholesterolemia, unspecified: Secondary | ICD-10-CM | POA: Diagnosis not present

## 2023-04-19 DIAGNOSIS — Z131 Encounter for screening for diabetes mellitus: Secondary | ICD-10-CM | POA: Diagnosis not present

## 2023-04-19 DIAGNOSIS — Z0000 Encounter for general adult medical examination without abnormal findings: Secondary | ICD-10-CM | POA: Diagnosis not present

## 2023-04-19 DIAGNOSIS — E559 Vitamin D deficiency, unspecified: Secondary | ICD-10-CM | POA: Diagnosis not present

## 2023-04-19 DIAGNOSIS — N1831 Chronic kidney disease, stage 3a: Secondary | ICD-10-CM | POA: Diagnosis not present

## 2023-04-19 DIAGNOSIS — Z833 Family history of diabetes mellitus: Secondary | ICD-10-CM | POA: Diagnosis not present

## 2023-04-19 DIAGNOSIS — Z1331 Encounter for screening for depression: Secondary | ICD-10-CM | POA: Diagnosis not present

## 2023-04-28 DIAGNOSIS — H903 Sensorineural hearing loss, bilateral: Secondary | ICD-10-CM | POA: Diagnosis not present

## 2023-05-17 DIAGNOSIS — D3132 Benign neoplasm of left choroid: Secondary | ICD-10-CM | POA: Diagnosis not present

## 2023-05-17 DIAGNOSIS — H53001 Unspecified amblyopia, right eye: Secondary | ICD-10-CM | POA: Diagnosis not present

## 2023-05-17 DIAGNOSIS — H25042 Posterior subcapsular polar age-related cataract, left eye: Secondary | ICD-10-CM | POA: Diagnosis not present

## 2023-05-17 DIAGNOSIS — H5203 Hypermetropia, bilateral: Secondary | ICD-10-CM | POA: Diagnosis not present

## 2023-05-18 DIAGNOSIS — Z6828 Body mass index (BMI) 28.0-28.9, adult: Secondary | ICD-10-CM | POA: Diagnosis not present

## 2023-05-18 DIAGNOSIS — Z01419 Encounter for gynecological examination (general) (routine) without abnormal findings: Secondary | ICD-10-CM | POA: Diagnosis not present

## 2023-05-25 DIAGNOSIS — H903 Sensorineural hearing loss, bilateral: Secondary | ICD-10-CM | POA: Diagnosis not present

## 2023-07-13 DIAGNOSIS — Z1283 Encounter for screening for malignant neoplasm of skin: Secondary | ICD-10-CM | POA: Diagnosis not present

## 2023-07-13 DIAGNOSIS — D225 Melanocytic nevi of trunk: Secondary | ICD-10-CM | POA: Diagnosis not present

## 2023-07-13 DIAGNOSIS — L821 Other seborrheic keratosis: Secondary | ICD-10-CM | POA: Diagnosis not present

## 2023-07-13 DIAGNOSIS — D2361 Other benign neoplasm of skin of right upper limb, including shoulder: Secondary | ICD-10-CM | POA: Diagnosis not present

## 2023-09-14 DIAGNOSIS — M7712 Lateral epicondylitis, left elbow: Secondary | ICD-10-CM | POA: Diagnosis not present

## 2023-10-05 DIAGNOSIS — M25522 Pain in left elbow: Secondary | ICD-10-CM | POA: Diagnosis not present

## 2023-10-09 ENCOUNTER — Other Ambulatory Visit: Payer: Self-pay | Admitting: Psychiatry

## 2023-10-09 DIAGNOSIS — F5105 Insomnia due to other mental disorder: Secondary | ICD-10-CM

## 2023-10-11 ENCOUNTER — Ambulatory Visit: Admitting: Psychiatry

## 2023-10-12 DIAGNOSIS — M25522 Pain in left elbow: Secondary | ICD-10-CM | POA: Diagnosis not present

## 2023-10-20 DIAGNOSIS — H903 Sensorineural hearing loss, bilateral: Secondary | ICD-10-CM | POA: Diagnosis not present

## 2023-10-21 DIAGNOSIS — M25522 Pain in left elbow: Secondary | ICD-10-CM | POA: Diagnosis not present

## 2023-10-26 ENCOUNTER — Ambulatory Visit (INDEPENDENT_AMBULATORY_CARE_PROVIDER_SITE_OTHER): Admitting: Psychiatry

## 2023-10-26 ENCOUNTER — Encounter: Payer: Self-pay | Admitting: Psychiatry

## 2023-10-26 DIAGNOSIS — F411 Generalized anxiety disorder: Secondary | ICD-10-CM | POA: Diagnosis not present

## 2023-10-26 DIAGNOSIS — G3184 Mild cognitive impairment, so stated: Secondary | ICD-10-CM

## 2023-10-26 DIAGNOSIS — G629 Polyneuropathy, unspecified: Secondary | ICD-10-CM

## 2023-10-26 DIAGNOSIS — F331 Major depressive disorder, recurrent, moderate: Secondary | ICD-10-CM | POA: Diagnosis not present

## 2023-10-26 DIAGNOSIS — F4310 Post-traumatic stress disorder, unspecified: Secondary | ICD-10-CM

## 2023-10-26 DIAGNOSIS — F5105 Insomnia due to other mental disorder: Secondary | ICD-10-CM

## 2023-10-26 MED ORDER — ALPRAZOLAM 0.25 MG PO TABS: ORAL_TABLET | ORAL | 2 refills | Status: AC

## 2023-10-26 MED ORDER — ZOLPIDEM TARTRATE 5 MG PO TABS: 5.0000 mg | ORAL_TABLET | Freq: Every evening | ORAL | 0 refills | Status: DC | PRN

## 2023-10-26 MED ORDER — DULOXETINE HCL 60 MG PO CPEP: 60.0000 mg | ORAL_CAPSULE | Freq: Two times a day (BID) | ORAL | 1 refills | Status: AC

## 2023-10-26 NOTE — Progress Notes (Signed)
 Lorraine May [id number] January 27, 1956 68 y.o.  Subjective:   Patient ID:  Lorraine May is a 68 y.o. (DOB 26-Aug-1955) female.  Chief Complaint:  Chief Complaint  Patient presents with   Follow-up   Depression   Anxiety    Lorraine May presents to the office today for follow-up of Hx TRD with benefit from TMS 2016-8 at [facility].   Verty effective.  seen June 2020.  The following changes were made: OK trial reduction lamotrigine  to 1/2 tablet in the morning and 1 in PM to see if memory is better. Discussed the risk of relapse and if it occurs to increase the medication back to the full dosage. NAC N-acetylcysteine 600 mg daily.  Off label for cognitive complaints including word-finding and STM issues.  Jan 2021 appt with the following noted: No changes with reduction in lamotrigine  in cognition.  Added NAC.  Asks about Cerefolin NAC again.   Forgets details in conversation. STM px.  Past history of neuropsych testing which dx MCI.  Decided not to move again and that's a good thing.  Had wondered about reducing the meds but Covid and civil unrest and politics disturbing.  Disturbing over the depth of [place] in the [place].  Telehealth is bothersome.  Concerned memory is not as good as in the past and wonders if related to meds. No sig Xanax . Patient reports stable mood and denies depressed or irritable moods.   Patient denies difficulty with sleep initiation or maintenance.  Sleep fairly well with gabapentin  and naltrexone. No med changes.  08/29/19 appt with the following noted: Vaccinated. Donepezil  5 without benefit and caused diarrhea so stopped. Stopped naltrexone bc decided it didn't help neuropathy. Patient reports mild depressed and worry over Covid.  Patient denies difficulty with sleep initiation or maintenance. Denies appetite disturbance.  Patient reports that energy and motivation have been good.  Patient denies any difficulty with concentration.  Patient denies any suicidal  ideation. Wonders about restarting naltrexone at low dose by [employer].bc it did help sleep some.  Asks I RX it. Having to use Ambien  now bc foot problems. Has continued Nathanel Aran phd. Recognizes some avoidance. Plan: Wrote prescription for patient to resume low-dose naltrexone which she had previously taken per her request  03/05/2020 appointment with the following noted: Xanax  prn occ. 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  It was hard.  Moved 2 mos ago. Anxiety inducing from niece with mental health problems including paranoid delusional.  Not in consistent treatment.    Not sig depressed.  Pandemic wearing. Switched lamotrigine  to 150 pm and feels knocked out in the evening. Plan: switch lamotrigine  to 150 mg 1/2 tab BID bc sleepiness in evening.  I would not advise further dose reduction.   Cerefolin NAC  09/02/2020 appointment with the following noted: Did not split lamotrigine  but switched it to the morning and is not so sleepy. Hardly ever used Xanax . Taking zolpidem  more for sleep than she had DT stressors and mind starts racing. Used to take 10 mg zolpidem .   Gabapentin  for neuropathy with SE memory and wonders about duloxetine . B alcoholic and DUI again.  Causing stress on the family. Limits news DT anxiety. Patient reports stable mood and denies depressed or irritable moods.  Patient residual anxiety.  Patient denies difficulty with sleep initiation or maintenance. Denies appetite disturbance.  Patient reports that energy and motivation have been good.  Patient denies any difficulty with concentration.  Patient denies any suicidal ideation.  03/09/2021 appt  noted: Neuro Dr. Darleen started duloxetine  and sees benefit.  Feet aren't burning now.  Tingling better and  neuropathy much better.  Increased to duloxetine  60 mg tomorrow. Able to wean off the gabapentin . Feels better about meds. H's parent's needing care and stressful.  H stressed.  Her parents deceased.   Not  markedly depressed.  Some anxiety over it. Taking Ambien  again for sleep.   Just bought a place at [facility].  10/08/21 appt noted: Covid early August. From H mild. First time. Stress B threatens suicide but she thinks it's manipulative.  Not doing well in retirement.  M had taken care of him and now M died. Poor social development of B.  Sister cares for him.   May have to sell her beach house. 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 Not watching news bc too anxious.   Started taking Ambien  again bc of stress and occ 1/2 Xanax . Duloxetine  60 from neuro helped neuropathy which caused pain.. Asks me to renew it and wants to try higher dose for partial response. Maybe feels a little more leveled out emotionally and less catastrophic thinking. Still on Buspar , lamotrigine , Ambie, prn Xanax . Tolerating meds. Good finances. But has had to help family members with money.  04/06/22 appt noted: Couldn't increase duloxetine  90  bc insur wouldn't cover.  She would like to try for Neuropathy.  Gets some relief and more than gabapentin  with it. On 60 mg now without SE On buspirone  30 BID, lamotrigine  150 daily, Ambien  5, alprazolam  0.25 mg rarely. No SE. Some anxiety but not depressed. Sleep well with Ambien . Plan: Agree with increase duloxetine  90 mg daily and disc SE in answer to questions bc partial response.for burning neuropathy.  This didn't happen at last appt.  Call if tolerated and NR and can increase to max 60 mg BID  07/23/22 incr duloxetine  120 mg for neuropathy  10/12/22 appt noted; Psych med: Zolpidem  2.5 to 5 mg nightly, lamotrigine  150 mg daily, duloxetine  120, buspirone  30 mg twice daily, alprazolam  0.25 mg twice daily as needed anxiety No SE now.  Sweating resolved. Less neuropathic tingling and pain and no SE with duloxetine  120 mg daily. Had tough couples of mos with B died stroke at 20.  No will and never married so she got Conservation officer, nature of his will.  Odd behavior of birdseed in the house.  Had anxiety problems.   Sold d his house as is.  Been doing a lot.  Also grief.  It was house she grew up in.   Feels duloxetine  helped her get threw this. Sisters help.  He had no money and she and H supported him. Doing ok overall.  04/12/23 appt noted: Psych med: Zolpidem  2.5 to 5 mg nightly, lamotrigine  150 mg daily, duloxetine  120, buspirone  30 mg twice daily, alprazolam  0.25 mg twice daily as needed anxiety No SE now.  Sweating resolved. Her therapist Nathanel Aran has retired but pt feels ok. Handled B's estate and things leveling out.   Wonders about reducing some meds.  Dulox helps mood and neuropathy. Dep and anxiety managed.  Duloxetine  helps neuropathy.    10/26/23 appt noted:  Psych med: Zolpidem  2.5 to 5 mg nightly, lamotrigine  150 mg daily, duloxetine  120, alprazolam  0.25 mg twice daily as needed anxiety Pretty good.  No dep.  No px off the buspirone .  No SE Cymbalta  helped neuropathy and better than gabapentin . Sleep pretty good.  No new health issues.  Normal cardiac workup.   No med concerns.    Past Psychiatric Medication Trials:   Nortriptyline side effects, Trintellix loss of benefit,  duloxetine  60 helped neuropathy,  mirtazapine, venlafaxine side effects, sertraline, Pristiq, Viibryd, Wellbutrin,  Depakote, carbamazepine,  lamotrigine ,  gabapentin  feels better off it. Seroquel, Saphris, Geodon,  TMS helped, 2016-8 at [facility]. trazodone no response,  buspirone , gabapentin   History of gastric bypass affects the absorption of some meds.   Review of Systems:  Review of Systems  Constitutional:  Positive for fatigue.  Cardiovascular:  Negative for chest pain and palpitations.  Gastrointestinal:  Negative for diarrhea.  Musculoskeletal:  Positive for arthralgias, back pain, joint swelling and myalgias.  Neurological:  Positive for numbness. Negative for tremors.       Bruning neuropathy foot and  legs   Psychiatric/Behavioral:  Negative for dysphoric mood.   Sx worse with stress  Medications: I have reviewed the patient's current medications.  Current Outpatient Medications  Medication Sig Dispense Refill   atorvastatin  (LIPITOR) 20 MG tablet Take 1 tablet (20 mg total) by mouth daily. 90 tablet 4   colestipol (COLESTID) 1 g tablet Take 1 g by mouth daily.   0   dicyclomine  (BENTYL ) 20 MG tablet Take 20 mg by mouth as needed for spasms.      lamoTRIgine  (LAMICTAL ) 150 MG tablet Take 1 tablet (150 mg total) by mouth daily. 90 tablet 3   ALPRAZolam  (XANAX ) 0.25 MG tablet TAKE 1 TABLET(0.25 MG) BY MOUTH TWICE DAILY AS NEEDED FOR ANXIETY 15 tablet 2   DULoxetine  (CYMBALTA ) 60 MG capsule Take 1 capsule (60 mg total) by mouth 2 (two) times daily. 180 capsule 1   zolpidem  (AMBIEN ) 5 MG tablet Take 1 tablet (5 mg total) by mouth at bedtime as needed. for sleep 30 tablet 0   No current facility-administered medications for this visit.    Medication Side Effects: None  Allergies:  Allergies  Allergen Reactions   Demeclocycline Nausea Only   Dilaudid [Hydromorphone Hcl] Itching   Fentanyl  Itching   Morphine And Codeine Itching   Nsaids     Cannot take due to kidney disease   Percocet [Oxycodone -Acetaminophen ]     itching   Tetracyclines & Related Nausea Only   Sulfa Antibiotics Rash    Past Medical History:  Diagnosis Date   Anxiety    Anxiety and depression 08/24/2011   Arthritis    Cancer (HCC)    colon   Chronic kidney disease    Depression    Hx of colon cancer, stage II 08/24/2011   T3N0  adenoca cecum resected 04/2004  Xeloda adjuvant chemo   Hyperlipidemia 08/24/2011   Joint pain    Nausea & vomiting    Neuropathy    PONV (postoperative nausea and vomiting)    Superficial basal cell carcinoma (BCC) 09/18/2012   Right Temple    Family History  Problem Relation Age of Onset   Diabetes Mother        type 1   Stroke Mother    Dementia Mother    Thyroid   disease Mother    Alcohol abuse Mother    Asthma Mother    Neuropathy Mother    Alcohol abuse Father    Diabetes Father    Thyroid  disease Sister    Psychiatric Illness Sister    Asthma Sister    Alcohol abuse Brother    Psychiatric Illness Brother    Cancer Maternal Uncle  throat   Cancer Paternal Aunt        germ cell   Cancer Maternal Grandmother        colon    Social History   Socioeconomic History   Marital status: Married    Spouse name: Lamar   Number of children: 1   Years of education: 16   Highest education level: Not on file  Occupational History   Occupation: Audiological scientist- Retired  Tobacco Use   Smoking status: Never   Smokeless tobacco: Never  Vaping Use   Vaping status: Never Used  Substance and Sexual Activity   Alcohol use: Yes    Comment: [place] once a week   Drug use: No   Sexual activity: Not on file  Other Topics Concern   Not on file  Social History Narrative   Lives with husband   Caffeine use: 2-3 cups daily   Right handed   Social Drivers of Corporate investment banker Strain: Not on file  Food Insecurity: Not on file  Transportation Needs: Not on file  Physical Activity: Not on file  Stress: Not on file  Social Connections: Not on file  Intimate Partner Violence: Not on file    Past Medical History, Surgical history, Social history, and Family history were reviewed and updated as appropriate.   Please see review of systems for further details on the patient's review from today.   Objective:   Physical Exam:  There were no vitals taken for this visit.  Physical Exam Constitutional:      General: She is not in acute distress.    Appearance: She is well-developed.  Musculoskeletal:        General: No deformity.  Neurological:     Mental Status: She is alert and oriented to person, place, and time.     Motor: No tremor.     Coordination: Coordination normal.     Gait: Gait normal.  Psychiatric:        Attention  and Perception: She does not perceive auditory hallucinations.        Mood and Affect: Mood is anxious. Mood is not depressed. Affect is not labile, blunt, angry, tearful or inappropriate.        Speech: Speech normal. Speech is not slurred.        Behavior: Behavior normal.        Thought Content: Thought content normal. Thought content is not delusional. Thought content does not include homicidal or suicidal ideation. Thought content does not include suicidal plan.        Cognition and Memory: She exhibits impaired recent memory.        Judgment: Judgment normal.     Comments: Insight intact. No auditory or visual hallucinations.  Somatic sx better with more duloxetine      Lab Review:     Component Value Date/Time   NA 140 02/02/2017 1554   NA 142 08/21/2013 1445   K 5.2 02/02/2017 1554   K 4.1 08/21/2013 1445   CL 102 02/02/2017 1554   CO2 24 02/02/2017 1554   CO2 26 08/21/2013 1445   GLUCOSE 97 02/02/2017 1554   GLUCOSE 120 (H) 02/07/2014 1107   GLUCOSE 91 08/21/2013 1445   BUN 19 02/02/2017 1554   BUN 18.7 08/21/2013 1445   CREATININE 1.17 (H) 02/02/2017 1554   CREATININE 1.1 08/21/2013 1445   CALCIUM  9.3 02/02/2017 1554   CALCIUM  9.1 08/21/2013 1445   PROT 6.6 03/03/2016 1124   PROT 6.5 08/21/2013  1445   ALBUMIN 4.7 02/07/2014 1107   ALBUMIN 3.7 08/21/2013 1445   AST 21 02/07/2014 1107   AST 12 08/21/2013 1445   ALT 14 02/07/2014 1107   ALT 11 08/21/2013 1445   ALKPHOS 64 02/07/2014 1107   ALKPHOS 56 08/21/2013 1445   BILITOT 0.6 02/07/2014 1107   BILITOT 0.27 08/21/2013 1445   GFRNONAA 50 (L) 02/02/2017 1554   GFRAA 57 (L) 02/02/2017 1554       Component Value Date/Time   WBC 6.3 02/07/2014 1107   RBC 4.28 02/07/2014 1107   HGB 12.9 02/07/2014 1107   HGB 12.3 08/21/2013 1445   HCT 38.7 02/07/2014 1107   HCT 37.2 08/21/2013 1445   PLT 253 02/07/2014 1107   PLT 226 08/21/2013 1445   MCV 90.4 02/07/2014 1107   MCV 93.2 08/21/2013 1445   MCH 30.1  02/07/2014 1107   MCHC 33.3 02/07/2014 1107   RDW 13.2 02/07/2014 1107   RDW 13.8 08/21/2013 1445   LYMPHSABS 1.5 08/21/2013 1445   MONOABS 0.3 08/21/2013 1445   EOSABS 0.1 08/21/2013 1445   BASOSABS 0.0 08/21/2013 1445    No results found for: POCLITH, LITHIUM    No results found for: PHENYTOIN, PHENOBARB, VALPROATE, CBMZ   .res Assessment: Plan:    Lorraine May was seen today for follow-up, depression and anxiety.  Diagnoses and all orders for this visit:  Major depressive disorder, recurrent episode, moderate (HCC)  Generalized anxiety disorder -     ALPRAZolam  (XANAX ) 0.25 MG tablet; TAKE 1 TABLET(0.25 MG) BY MOUTH TWICE DAILY AS NEEDED FOR ANXIETY  PTSD (post-traumatic stress disorder) -     ALPRAZolam  (XANAX ) 0.25 MG tablet; TAKE 1 TABLET(0.25 MG) BY MOUTH TWICE DAILY AS NEEDED FOR ANXIETY  Insomnia due to mental condition -     zolpidem  (AMBIEN ) 5 MG tablet; Take 1 tablet (5 mg total) by mouth at bedtime as needed. for sleep  Mild cognitive impairment  Small fiber neuropathy -     DULoxetine  (CYMBALTA ) 60 MG capsule; Take 1 capsule (60 mg total) by mouth 2 (two) times daily.    30 min face to face time with patient. We discussed  As can be seen the patient is failed a long list of psychiatric medications.  Fortunately she did respond to TMS for depression.  Cont meds that are working and tolerating well.  Maintained the TMS benefit for depression.  .    Better with increase duloxetine  60 mg BID both modd and neuropathy.  Mood is stable. Overall  Since benefit with duloxtine 120 mg daily  Has continued telehealth with therapist Nathanel Aran PHD. And it's helpful.  Because of continued neurocognitive complaints with a history of diagnosis of mild cognitive impairment by neuropsych testing we rec lithium  but she has stigma about it. Continue Cerefolin-NAC which she took in the past for MCI. She feels it's helpful and wants to continue it.  continue  lamotrigine  to 150 mg daily.  I would not advise further dose reduction.    Lamotrigine  is being used to reduce risk of recurrent depression given multiple med failures as noted above. Answered questions on need to continue longterm bc high relapse risk.  Her sleep is managed but we also discussed the potential that zolpidem  is interfering with her memory.  Consider alternatives.  Disc difference with generics. Needs ambien  to shut down brain at night.  If taper go 1/4 per month. OK zolpidem  2.5-5 mg HS.  No SE  No med changes  Rare  Xanax .  Not much stress.   FU 6 mos.  Lorene Macintosh, MD, DFAPA   Please see After Visit Summary for patient specific instructions.  No future appointments.    No orders of the defined types were placed in this encounter.      -------------------------------

## 2023-11-02 DIAGNOSIS — M25522 Pain in left elbow: Secondary | ICD-10-CM | POA: Diagnosis not present

## 2023-11-16 DIAGNOSIS — M25522 Pain in left elbow: Secondary | ICD-10-CM | POA: Diagnosis not present

## 2023-11-23 DIAGNOSIS — M25522 Pain in left elbow: Secondary | ICD-10-CM | POA: Diagnosis not present

## 2023-12-05 DIAGNOSIS — M7712 Lateral epicondylitis, left elbow: Secondary | ICD-10-CM | POA: Diagnosis not present

## 2023-12-12 ENCOUNTER — Other Ambulatory Visit: Payer: Self-pay | Admitting: Psychiatry

## 2023-12-12 DIAGNOSIS — F5105 Insomnia due to other mental disorder: Secondary | ICD-10-CM

## 2024-01-14 ENCOUNTER — Other Ambulatory Visit: Payer: Self-pay | Admitting: Gastroenterology

## 2024-01-17 ENCOUNTER — Other Ambulatory Visit: Payer: Self-pay | Admitting: Obstetrics and Gynecology

## 2024-01-17 DIAGNOSIS — Z1231 Encounter for screening mammogram for malignant neoplasm of breast: Secondary | ICD-10-CM

## 2024-01-24 ENCOUNTER — Encounter: Payer: Self-pay | Admitting: Cardiovascular Disease

## 2024-02-14 ENCOUNTER — Ambulatory Visit
Admission: RE | Admit: 2024-02-14 | Discharge: 2024-02-14 | Disposition: A | Source: Ambulatory Visit | Attending: Obstetrics and Gynecology | Admitting: Obstetrics and Gynecology

## 2024-02-14 DIAGNOSIS — Z1231 Encounter for screening mammogram for malignant neoplasm of breast: Secondary | ICD-10-CM

## 2024-02-28 ENCOUNTER — Other Ambulatory Visit: Payer: Self-pay | Admitting: Cardiovascular Disease

## 2024-02-28 ENCOUNTER — Other Ambulatory Visit: Payer: Self-pay | Admitting: Psychiatry

## 2024-02-28 DIAGNOSIS — G629 Polyneuropathy, unspecified: Secondary | ICD-10-CM

## 2024-03-05 NOTE — Telephone Encounter (Signed)
 Pt needs Lipid Panel

## 2024-04-24 ENCOUNTER — Ambulatory Visit: Admitting: Psychiatry
